# Patient Record
Sex: Male | Born: 1950 | Race: White | Hispanic: No | Marital: Married | Smoking: Former smoker
Health system: Southern US, Community
[De-identification: ages and names within clinical notes are randomized; demographics above are authoritative.]

## PROBLEM LIST (undated history)

## (undated) DIAGNOSIS — G4733 Obstructive sleep apnea (adult) (pediatric): Principal | ICD-10-CM

## (undated) DIAGNOSIS — I2510 Atherosclerotic heart disease of native coronary artery without angina pectoris: Secondary | ICD-10-CM

## (undated) DIAGNOSIS — T7840XA Allergy, unspecified, initial encounter: Secondary | ICD-10-CM

## (undated) DIAGNOSIS — I10 Essential (primary) hypertension: Secondary | ICD-10-CM

## (undated) DIAGNOSIS — I5020 Unspecified systolic (congestive) heart failure: Secondary | ICD-10-CM

## (undated) DIAGNOSIS — E669 Obesity, unspecified: Secondary | ICD-10-CM

## (undated) DIAGNOSIS — E785 Hyperlipidemia, unspecified: Secondary | ICD-10-CM

## (undated) DIAGNOSIS — I219 Acute myocardial infarction, unspecified: Secondary | ICD-10-CM

## (undated) DIAGNOSIS — I513 Intracardiac thrombosis, not elsewhere classified: Secondary | ICD-10-CM

## (undated) DIAGNOSIS — G4730 Sleep apnea, unspecified: Secondary | ICD-10-CM

## (undated) DIAGNOSIS — E119 Type 2 diabetes mellitus without complications: Secondary | ICD-10-CM

## (undated) HISTORY — DX: Unspecified systolic (congestive) heart failure: I50.20

## (undated) HISTORY — DX: Type 2 diabetes mellitus without complications: E11.9

## (undated) HISTORY — DX: Atherosclerotic heart disease of native coronary artery without angina pectoris: I25.10

## (undated) HISTORY — DX: Obesity, unspecified: E66.9

## (undated) HISTORY — DX: Acute myocardial infarction, unspecified: I21.9

## (undated) HISTORY — DX: Sleep apnea, unspecified: G47.30

## (undated) HISTORY — DX: Intracardiac thrombosis, not elsewhere classified: I51.3

## (undated) HISTORY — DX: Essential (primary) hypertension: I10

## (undated) HISTORY — DX: Hyperlipidemia, unspecified: E78.5

## (undated) HISTORY — DX: Allergy, unspecified, initial encounter: T78.40XA

## (undated) HISTORY — DX: Obstructive sleep apnea (adult) (pediatric): G47.33

---

## 1999-04-18 ENCOUNTER — Encounter: Payer: Self-pay | Admitting: Emergency Medicine

## 1999-04-18 HISTORY — PX: CORONARY STENT PLACEMENT: SHX1402

## 1999-04-19 ENCOUNTER — Inpatient Hospital Stay (HOSPITAL_COMMUNITY): Admission: EM | Admit: 1999-04-19 | Discharge: 1999-04-25 | Payer: Self-pay | Admitting: Cardiology

## 1999-04-20 DIAGNOSIS — I219 Acute myocardial infarction, unspecified: Secondary | ICD-10-CM

## 1999-04-20 HISTORY — DX: Acute myocardial infarction, unspecified: I21.9

## 1999-04-22 ENCOUNTER — Encounter: Payer: Self-pay | Admitting: *Deleted

## 1999-07-07 ENCOUNTER — Ambulatory Visit (HOSPITAL_COMMUNITY): Admission: RE | Admit: 1999-07-07 | Discharge: 1999-07-07 | Payer: Self-pay | Admitting: *Deleted

## 2010-01-05 LAB — HM COLONOSCOPY

## 2014-02-02 ENCOUNTER — Telehealth: Payer: Self-pay | Admitting: Family Medicine

## 2014-02-02 NOTE — Telephone Encounter (Signed)
Appointment given for 2/29 with Evelina Dun, FNP

## 2014-03-01 ENCOUNTER — Ambulatory Visit: Payer: Self-pay | Admitting: Family

## 2014-05-13 ENCOUNTER — Encounter: Payer: Self-pay | Admitting: Physician Assistant

## 2014-05-13 DIAGNOSIS — T7840XA Allergy, unspecified, initial encounter: Secondary | ICD-10-CM | POA: Insufficient documentation

## 2014-05-21 ENCOUNTER — Encounter: Payer: Self-pay | Admitting: Family Medicine

## 2014-05-21 ENCOUNTER — Ambulatory Visit (INDEPENDENT_AMBULATORY_CARE_PROVIDER_SITE_OTHER): Payer: Managed Care, Other (non HMO) | Admitting: Family Medicine

## 2014-05-21 VITALS — BP 100/78 | HR 60 | Temp 98.7°F | Resp 18 | Ht 70.0 in | Wt 264.0 lb

## 2014-05-21 DIAGNOSIS — R7309 Other abnormal glucose: Secondary | ICD-10-CM

## 2014-05-21 DIAGNOSIS — E785 Hyperlipidemia, unspecified: Secondary | ICD-10-CM | POA: Diagnosis not present

## 2014-05-21 DIAGNOSIS — Z0000 Encounter for general adult medical examination without abnormal findings: Secondary | ICD-10-CM | POA: Diagnosis not present

## 2014-05-21 DIAGNOSIS — I2510 Atherosclerotic heart disease of native coronary artery without angina pectoris: Secondary | ICD-10-CM | POA: Diagnosis not present

## 2014-05-21 DIAGNOSIS — Z7189 Other specified counseling: Secondary | ICD-10-CM

## 2014-05-21 DIAGNOSIS — Z7689 Persons encountering health services in other specified circumstances: Secondary | ICD-10-CM

## 2014-05-21 DIAGNOSIS — R7303 Prediabetes: Secondary | ICD-10-CM

## 2014-05-21 NOTE — Progress Notes (Signed)
Subjective:    Patient ID: Benjamin [name], male    DOB: 1950-03-24, 64 y.o.   MRN: [id number]  HPI Patient is here today to establish care. His last colonoscopy was in 2012. Per the patient he had polyps. He is recommended to have a repeat colonoscopy in 2015. This is overdue. He also is due for an annual prostate exam. He is due for the shingles vaccine. He is due for the tetanus vaccine. Past medical history is significant for myocardial infarction in 2001. He also has hyperlipidemia. Patient recently had fasting lab work performed in outside lab. CMP was significant for a fasting blood sugar of 112, a hemoglobin A1c of 6.2. PSA was normal at 1.5. CBC was normal. TSH was normal. LDL cholesterol 60 HDL cholesterol is 51 triglycerides are 134.   Past Medical History  Diagnosis Date  . Allergy   . Myocardial infarction 04/20/1999  . ASCVD (arteriosclerotic cardiovascular disease)   . Hyperlipidemia   . Hypertension    Past Surgical History  Procedure Laterality Date  . Coronary stent placement  04/18/1999   Current Outpatient Prescriptions on File Prior to Visit  Medication Sig Dispense Refill  . atorvastatin (LIPITOR) 80 MG tablet Take 80 mg by mouth daily.    . carvedilol (COREG) 3.125 MG tablet Take 3.125 mg by mouth 2 (two) times daily with a meal.    . ramipril (ALTACE) 10 MG capsule Take 10 mg by mouth daily.     No current facility-administered medications on file prior to visit.   Allergies  Allergen Reactions  . Penicillins    History   Social History  . Marital Status: Married    Spouse Name: N/A  . Number of Children: N/A  . Years of Education: N/A   Occupational History  . Not on file.   Social History Main Topics  . Smoking status: Former Smoker    Quit date: 05/13/1958  . Smokeless tobacco: Never Used  . Alcohol Use: No  . Drug Use: No  . Sexual Activity: Yes    Birth Control/ Protection: None   Other Topics Concern  . Not on file   Social History  Narrative   No family history on file.   Review of Systems  All other systems reviewed and are negative.      Objective:   Physical Exam  Constitutional: He is oriented to person, place, and time. He appears well-developed and well-nourished. No distress.  HENT:  Head: Normocephalic and atraumatic.  Right Ear: External ear normal.  Left Ear: External ear normal.  Nose: Nose normal.  Mouth/Throat: Oropharynx is clear and moist. No oropharyngeal exudate.  Eyes: Conjunctivae and EOM are normal. Pupils are equal, round, and reactive to light. Right eye exhibits no discharge. Left eye exhibits no discharge. No scleral icterus.  Neck: Normal range of motion. Neck supple. No JVD present. No tracheal deviation present. No thyromegaly present.  Cardiovascular: Normal rate, regular rhythm, normal heart sounds and intact distal pulses.  Exam reveals no gallop and no friction rub.   No murmur heard. Pulmonary/Chest: Effort normal and breath sounds normal. No stridor. No respiratory distress. He has no wheezes. He has no rales. He exhibits no tenderness.  Abdominal: Soft. Bowel sounds are normal. He exhibits no distension and no mass. There is no tenderness. There is no rebound and no guarding.  Genitourinary: Rectum normal and prostate normal.  Musculoskeletal: Normal range of motion. He exhibits no edema or tenderness.  Lymphadenopathy:  He has no cervical adenopathy.  Neurological: He is alert and oriented to person, place, and time. He has normal reflexes. He displays normal reflexes. No cranial nerve deficit. He exhibits normal muscle tone. Coordination normal.  Skin: Skin is warm. No rash noted. He is not diaphoretic. No erythema. No pallor.  Psychiatric: He has a normal mood and affect. His behavior is normal. Judgment and thought content normal.  Vitals reviewed.         Assessment & Plan:  Routine general medical examination at a health care facility  Establishing care with  new doctor, encounter for  ASCVD (arteriosclerotic cardiovascular disease)  HLD (hyperlipidemia)  Prediabetes  Patient is a prediabetic. I recommended 10-15 pounds weight loss, increasing aerobic exercise, decreasing carbohydrates in his diet. I would recheck a hemoglobin A1c and fasting blood sugar in 6 months. PSA today is normal. Prostate exam is normal. I will obtain records from his gastroenterologist. He had precancerous polyps he is due for a colonoscopy now area and I first would like to obtain old records to review. I recommended a shingles vaccine as well as a tetanus shot but the patient deferred at the present time. Cholesterol is acceptable. Blood pressures acceptable. Recheck in 6 months.

## 2014-06-28 ENCOUNTER — Other Ambulatory Visit: Payer: Self-pay | Admitting: Family Medicine

## 2014-06-28 MED ORDER — CARVEDILOL 3.125 MG PO TABS: 3.1250 mg | ORAL_TABLET | Freq: Two times a day (BID) | ORAL | Status: DC

## 2014-06-28 MED ORDER — RAMIPRIL 10 MG PO CAPS: 10.0000 mg | ORAL_CAPSULE | Freq: Every day | ORAL | Status: DC

## 2014-06-28 MED ORDER — ATORVASTATIN CALCIUM 80 MG PO TABS: 80.0000 mg | ORAL_TABLET | Freq: Every day | ORAL | Status: DC

## 2014-06-28 NOTE — Telephone Encounter (Signed)
Medication refilled per protocol. 

## 2014-09-23 ENCOUNTER — Encounter: Payer: Self-pay | Admitting: Family Medicine

## 2014-09-23 ENCOUNTER — Encounter: Payer: BC Managed Care – PPO | Admitting: Family Medicine

## 2014-09-24 NOTE — Progress Notes (Signed)
This encounter was created in error - please disregard.

## 2014-10-10 ENCOUNTER — Other Ambulatory Visit: Payer: Self-pay | Admitting: Family Medicine

## 2014-10-11 NOTE — Telephone Encounter (Signed)
Refill appropriate and filled per protocol. 

## 2014-10-26 ENCOUNTER — Encounter: Payer: Self-pay | Admitting: Family Medicine

## 2014-11-24 ENCOUNTER — Encounter: Payer: Self-pay | Admitting: Family Medicine

## 2014-11-24 ENCOUNTER — Ambulatory Visit (INDEPENDENT_AMBULATORY_CARE_PROVIDER_SITE_OTHER): Payer: BC Managed Care – PPO | Admitting: Family Medicine

## 2014-11-24 VITALS — BP 118/74 | HR 68 | Temp 98.4°F | Resp 20 | Ht 70.0 in | Wt 275.0 lb

## 2014-11-24 DIAGNOSIS — Z1211 Encounter for screening for malignant neoplasm of colon: Secondary | ICD-10-CM | POA: Diagnosis not present

## 2014-11-24 DIAGNOSIS — R7303 Prediabetes: Secondary | ICD-10-CM | POA: Diagnosis not present

## 2014-11-24 DIAGNOSIS — E785 Hyperlipidemia, unspecified: Secondary | ICD-10-CM | POA: Diagnosis not present

## 2014-11-24 DIAGNOSIS — I2510 Atherosclerotic heart disease of native coronary artery without angina pectoris: Secondary | ICD-10-CM

## 2014-11-24 DIAGNOSIS — Z23 Encounter for immunization: Secondary | ICD-10-CM | POA: Diagnosis not present

## 2014-11-24 NOTE — Addendum Note (Signed)
Addended by: Shary Decamp B on: 11/24/2014 03:11 PM   Modules accepted: Orders

## 2014-11-24 NOTE — Progress Notes (Signed)
Subjective:    Patient ID: Benjamin [name], male    DOB: 11/21/1950, 64 y.o.   MRN: [id number]  HPI 05/21/14 Patient is here today to establish care. His last colonoscopy was in 2012. Per the patient he had polyps. He is recommended to have a repeat colonoscopy in 2015. This  The patientis overdue. He also is due for an annual prostate exam. He is due for the shingles vaccine. He is due for the tetanus vaccine. Past medical history is significant for myocardial infarction in 2001. He also has hyperlipidemia. Patient recently had fasting lab work performed in outside lab. CMP was significant for a fasting blood sugar of 112, a hemoglobin A1c of 6.2. PSA was normal at 1.5. CBC was normal. TSH was normal. LDL cholesterol 60 HDL cholesterol is 51 triglycerides are 134.  At that time, my plan was: Patient is a prediabetic. I recommended 10-15 pounds weight loss, increasing aerobic exercise, decreasing carbohydrates in his diet. I would recheck a hemoglobin A1c and fasting blood sugar in 6 months. PSA today is normal. Prostate exam is normal. I will obtain records from his gastroenterologist. He had precancerous polyps he is due for a colonoscopy now area and I first would like to obtain old records to review. I recommended a shingles vaccine as well as a tetanus shot but the patient deferred at the present time. Cholesterol is acceptable. Blood pressures acceptable. Recheck in 6 months.  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 His blood pressure today is well controlled at 118/74.  He denies any chest pain, angina, doe, orthopnea, or peripheral edema.  Due for fasting labs, Pneumovax, and a colonoscopy.   Past Medical History  Diagnosis Date  . Allergy   . Myocardial infarction ([place]) 04/20/1999  . ASCVD (arteriosclerotic cardiovascular disease)   . Hyperlipidemia   . Hypertension    Past Surgical History  Procedure  Laterality Date  . Coronary stent placement  04/18/1999   Current Outpatient Prescriptions on File Prior to Visit  Medication Sig Dispense Refill  . aspirin 81 MG tablet Take 81 mg by mouth daily.    Marland Kitchen atorvastatin (LIPITOR) 80 MG tablet TAKE 1 TABLET (80 MG TOTAL) BY MOUTH DAILY AT 6 PM. 90 tablet 0  . carvedilol (COREG) 3.125 MG tablet TAKE 1 TABLET (3.125 MG TOTAL) BY MOUTH 2 (TWO) TIMES DAILY WITH A MEAL. 180 tablet 0  . Cholecalciferol (VITAMIN D) 2000 UNITS CAPS Take by mouth.    . ramipril (ALTACE) 10 MG capsule TAKE 1 CAPSULE (10 MG TOTAL) BY MOUTH DAILY. 90 capsule 0   No current facility-administered medications on file prior to visit.   Allergies  Allergen Reactions  . Penicillins    Social History   Social History  . Marital Status: Married    Spouse Name: N/A  . Number of Children: N/A  . Years of Education: N/A   Occupational History  . Not on file.   Social History Main Topics  . Smoking status: Former Smoker    Quit date: 05/13/1958  . Smokeless tobacco: Never Used  . Alcohol Use: No  . Drug Use: No  . Sexual Activity: Yes    Birth Control/ Protection: None   Other Topics Concern  . Not on file   Social History Narrative   No family history on file.   Review of Systems  All other systems reviewed and are negative.  Objective:   Physical Exam  Constitutional: He is oriented to person, place, and time. He appears well-developed and well-nourished. No distress.  HENT:  Head: Normocephalic and atraumatic.  Right Ear: External ear normal.  Left Ear: External ear normal.  Nose: Nose normal.  Mouth/Throat: Oropharynx is clear and moist. No oropharyngeal exudate.  Eyes: Conjunctivae and EOM are normal. Pupils are equal, round, and reactive to light. Right eye exhibits no discharge. Left eye exhibits no discharge. No scleral icterus.  Neck: Normal range of motion. Neck supple. No JVD present. No tracheal deviation present. No thyromegaly present.    Cardiovascular: Normal rate, regular rhythm, normal heart sounds and intact distal pulses.  Exam reveals no gallop and no friction rub.   No murmur heard. Pulmonary/Chest: Effort normal and breath sounds normal. No stridor. No respiratory distress. He has no wheezes. He has no rales. He exhibits no tenderness.  Abdominal: Soft. Bowel sounds are normal. He exhibits no distension and no mass. There is no tenderness. There is no rebound and no guarding.  Genitourinary: Rectum normal and prostate normal.  Musculoskeletal: Normal range of motion. He exhibits no edema or tenderness.  Lymphadenopathy:    He has no cervical adenopathy.  Neurological: He is alert and oriented to person, place, and time. He has normal reflexes. No cranial nerve deficit. He exhibits normal muscle tone. Coordination normal.  Skin: Skin is warm. No rash noted. He is not diaphoretic. No erythema. No pallor.  Psychiatric: He has a normal mood and affect. His behavior is normal. Judgment and thought content normal.  Vitals reviewed.         Assessment & Plan:  ASCVD (arteriosclerotic cardiovascular disease) - Plan: COMPLETE METABOLIC PANEL WITH GFR, Lipid panel  HLD (hyperlipidemia)  Prediabetes - Plan: COMPLETE METABOLIC PANEL WITH GFR, Lipid panel, Hemoglobin A1c  Colon cancer screening - Plan: Ambulatory referral to Gastroenterology  His blood pressure is excellent.  He has no symptoms of unstable angina.  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  We will fax this to the [facility].  I will schedule him for a colonoscopy.  I will also administer pneumovax 23 today.  I asked him to return fasting for CMP/FLP/HgA1c next week.

## 2014-11-30 ENCOUNTER — Other Ambulatory Visit: Payer: BC Managed Care – PPO

## 2014-11-30 LAB — COMPLETE METABOLIC PANEL WITH GFR
ALT: 19 U/L (ref 9–46)
AST: 18 U/L (ref 10–35)
Albumin: 3.9 g/dL (ref 3.6–5.1)
Alkaline Phosphatase: 108 U/L (ref 40–115)
BUN: 17 mg/dL (ref 7–25)
CHLORIDE: 102 mmol/L (ref 98–110)
CO2: 27 mmol/L (ref 20–31)
Calcium: 9.1 mg/dL (ref 8.6–10.3)
Creat: 1.19 mg/dL (ref 0.70–1.25)
GFR, EST AFRICAN AMERICAN: 74 mL/min (ref >=60)
GFR, Est Non African American: 64 mL/min (ref >=60)
GLUCOSE: 102 mg/dL — AB (ref 70–99)
Potassium: 5.1 mmol/L (ref 3.5–5.3)
SODIUM: 138 mmol/L (ref 135–146)
TOTAL PROTEIN: 6.3 g/dL (ref 6.1–8.1)
Total Bilirubin: 0.9 mg/dL (ref 0.2–1.2)

## 2014-11-30 LAB — LIPID PANEL
Cholesterol: 133 mg/dL (ref 125–200)
HDL: 43 mg/dL (ref >=40)
LDL CALC: 67 mg/dL (ref <130)
TRIGLYCERIDES: 113 mg/dL (ref <150)
Total CHOL/HDL Ratio: 3.1 Ratio (ref <=5.0)
VLDL: 23 mg/dL (ref <30)

## 2014-11-30 LAB — HEMOGLOBIN A1C
HEMOGLOBIN A1C: 6.5 % — AB (ref <5.7)
Mean Plasma Glucose: 140 mg/dL — ABNORMAL HIGH (ref <117)

## 2015-02-01 ENCOUNTER — Encounter: Payer: Self-pay | Admitting: Family Medicine

## 2015-02-01 ENCOUNTER — Ambulatory Visit (INDEPENDENT_AMBULATORY_CARE_PROVIDER_SITE_OTHER): Payer: BC Managed Care – PPO | Admitting: Family Medicine

## 2015-02-01 VITALS — BP 110/66 | HR 76 | Temp 98.6°F | Resp 20 | Wt 269.0 lb

## 2015-02-01 DIAGNOSIS — J324 Chronic pansinusitis: Secondary | ICD-10-CM

## 2015-02-01 MED ORDER — CEFDINIR 300 MG PO CAPS: 300.0000 mg | ORAL_CAPSULE | Freq: Two times a day (BID) | ORAL | Status: DC

## 2015-02-01 NOTE — Progress Notes (Signed)
   Subjective:    Patient ID: Benjamin [name], male    DOB: 14-Jul-1950, 65 y.o.   MRN: [id number]  HPI   patient has been dealing with an upper respiratory infection for 2 months. Symptoms consist of sinus pressure, sinus pain, constant postnasal drip, constant pain and pressure in his frontal and maxillary sinuses and subjective fever. He is waiting for more than 6 weeks with no improvement in his symptoms Past Medical History  Diagnosis Date  . Allergy   . Myocardial infarction ([place]) 04/20/1999  . ASCVD (arteriosclerotic cardiovascular disease)   . Hyperlipidemia   . Hypertension    Past Surgical History  Procedure Laterality Date  . Coronary stent placement  04/18/1999   Current Outpatient Prescriptions on File Prior to Visit  Medication Sig Dispense Refill  . aspirin 81 MG tablet Take 81 mg by mouth daily.    Marland Kitchen atorvastatin (LIPITOR) 80 MG tablet TAKE 1 TABLET (80 MG TOTAL) BY MOUTH DAILY AT 6 PM. 90 tablet 0  . carvedilol (COREG) 3.125 MG tablet TAKE 1 TABLET (3.125 MG TOTAL) BY MOUTH 2 (TWO) TIMES DAILY WITH A MEAL. 180 tablet 0  . Cholecalciferol (VITAMIN D) 2000 UNITS CAPS Take by mouth.    . ramipril (ALTACE) 10 MG capsule TAKE 1 CAPSULE (10 MG TOTAL) BY MOUTH DAILY. 90 capsule 0   No current facility-administered medications on file prior to visit.   Allergies  Allergen Reactions  . Penicillins    Social History   Social History  . Marital Status: Married    Spouse Name: N/A  . Number of Children: N/A  . Years of Education: N/A   Occupational History  . Not on file.   Social History Main Topics  . Smoking status: Former Smoker    Quit date: 05/13/1958  . Smokeless tobacco: Never Used  . Alcohol Use: No  . Drug Use: No  . Sexual Activity: Yes    Birth Control/ Protection: None   Other Topics Concern  . Not on file   Social History Narrative     Review of Systems  All other systems reviewed and are negative.      Objective:   Physical Exam    Constitutional: He appears well-developed and well-nourished.  HENT:  Right Ear: Tympanic membrane and ear canal normal.  Left Ear: Tympanic membrane and ear canal normal.  Nose: Mucosal edema and rhinorrhea present. Right sinus exhibits maxillary sinus tenderness and frontal sinus tenderness. Left sinus exhibits maxillary sinus tenderness and frontal sinus tenderness.  Mouth/Throat: Oropharynx is clear and moist. No oropharyngeal exudate.  Eyes: Conjunctivae are normal. Pupils are equal, round, and reactive to light.  Neck: Neck supple.  Cardiovascular: Normal rate, regular rhythm and normal heart sounds.   No murmur heard. Pulmonary/Chest: Effort normal and breath sounds normal. No respiratory distress. He has no wheezes. He has no rales.  Lymphadenopathy:    He has no cervical adenopathy.  Vitals reviewed.         Assessment & Plan:  Chronic pansinusitis - Plan: cefdinir (OMNICEF) 300 MG capsule   Patient has a chronic sinus infection. Begin Omnicef 300 mg by mouth twice a day for 10 days and use nasal saline. The documented allergy to penicillin sounds more like a viral rash he had when he was a toddler. Recheck in 2 weeks if no better or immediately if worse

## 2015-02-11 ENCOUNTER — Ambulatory Visit (INDEPENDENT_AMBULATORY_CARE_PROVIDER_SITE_OTHER): Payer: BC Managed Care – PPO | Admitting: Family Medicine

## 2015-02-11 ENCOUNTER — Encounter: Payer: Self-pay | Admitting: Family Medicine

## 2015-02-11 VITALS — BP 110/80 | HR 74 | Temp 98.4°F | Resp 18 | Ht 70.0 in | Wt 269.0 lb

## 2015-02-11 DIAGNOSIS — R053 Chronic cough: Secondary | ICD-10-CM

## 2015-02-11 DIAGNOSIS — R05 Cough: Secondary | ICD-10-CM

## 2015-02-11 MED ORDER — LOSARTAN POTASSIUM 100 MG PO TABS: 100.0000 mg | ORAL_TABLET | Freq: Every day | ORAL | Status: DC

## 2015-02-11 MED ORDER — BENZONATATE 100 MG PO CAPS: 200.0000 mg | ORAL_CAPSULE | Freq: Three times a day (TID) | ORAL | Status: DC | PRN

## 2015-02-11 NOTE — Progress Notes (Signed)
Subjective:    Patient ID: Benjamin [name], male    DOB: [birth year], 65 y.o.   MRN: [id number]  HPI  02/01/15 Patient has been dealing with an upper respiratory infection for 2 months. Symptoms consist of sinus pressure, sinus pain, constant postnasal drip, constant pain and pressure in his frontal and maxillary sinuses and subjective fever. He is waiting for more than 6 weeks with no improvement in his symptoms.  At that time, my plan was: Patient has a chronic sinus infection. Begin Omnicef 300 mg by mouth twice a day for 10 days and use nasal saline. The documented allergy to penicillin sounds more like a viral rash he had when he was a toddler. Recheck in 2 weeks if no better or immediately if worse.  02/11/15 Sinusitis has completely resolved. He denies any sinus headache. He denies any sinus pain. He denies any postnasal drip. He denies any fevers.  However he continues to have a tickle cough that will not subside. It is nonproductive. He denies any fevers or chills or hemoptysis. The cough has been going on for quite some time   Past Medical History  Diagnosis Date  . Allergy   . Myocardial infarction ([place]) 04/20/1999  . ASCVD (arteriosclerotic cardiovascular disease)   . Hyperlipidemia   . Hypertension    Past Surgical History  Procedure Laterality Date  . Coronary stent placement  04/18/1999   Current Outpatient Prescriptions on File Prior to Visit  Medication Sig Dispense Refill  . aspirin 81 MG tablet Take 81 mg by mouth daily.    Marland Kitchen atorvastatin (LIPITOR) 80 MG tablet TAKE 1 TABLET (80 MG TOTAL) BY MOUTH DAILY AT 6 PM. 90 tablet 0  . carvedilol (COREG) 3.125 MG tablet TAKE 1 TABLET (3.125 MG TOTAL) BY MOUTH 2 (TWO) TIMES DAILY WITH A MEAL. 180 tablet 0  . cefdinir (OMNICEF) 300 MG capsule Take 1 capsule (300 mg total) by mouth 2 (two) times daily. 20 capsule 0  . Cholecalciferol (VITAMIN D) 2000 UNITS CAPS Take by mouth.    . ramipril (ALTACE) 10 MG capsule TAKE 1 CAPSULE (10  MG TOTAL) BY MOUTH DAILY. 90 capsule 0   No current facility-administered medications on file prior to visit.   Allergies  Allergen Reactions  . Penicillins    Social History   Social History  . Marital Status: Married    Spouse Name: N/A  . Number of Children: N/A  . Years of Education: N/A   Occupational History  . Not on file.   Social History Main Topics  . Smoking status: Former Smoker    Quit date: 05/13/1958  . Smokeless tobacco: Never Used  . Alcohol Use: No  . Drug Use: No  . Sexual Activity: Yes    Birth Control/ Protection: None   Other Topics Concern  . Not on file   Social History Narrative     Review of Systems  All other systems reviewed and are negative.      Objective:   Physical Exam  Constitutional: He appears well-developed and well-nourished.  HENT:  Right Ear: Tympanic membrane and ear canal normal.  Left Ear: Tympanic membrane and ear canal normal.  Nose: No mucosal edema or rhinorrhea. Right sinus exhibits no maxillary sinus tenderness and no frontal sinus tenderness. Left sinus exhibits no maxillary sinus tenderness and no frontal sinus tenderness.  Mouth/Throat: Oropharynx is clear and moist. No oropharyngeal exudate.  Eyes: Conjunctivae are normal. Pupils are equal, round, and reactive to light.  Neck: Neck supple.  Cardiovascular: Normal rate, regular rhythm and normal heart sounds.   No murmur heard. Pulmonary/Chest: Effort normal and breath sounds normal. No respiratory distress. He has no wheezes. He has no rales.  Lymphadenopathy:    He has no cervical adenopathy.  Vitals reviewed.         Assessment & Plan:  Chronic cough - Plan: losartan (COZAAR) 100 MG tablet, benzonatate (TESSALON) 100 MG capsule  All the cough could be due to sinusitis, I would like the patient to discontinue gram of peripheral and replaced with losartan 100 mg by mouth daily. Use Tessalon Perles 200 mg every 8 hours as needed for cough. If cough is  not better in 2 weeks, proceed with a chest x-ray

## 2015-04-13 ENCOUNTER — Other Ambulatory Visit: Payer: Self-pay | Admitting: Family Medicine

## 2015-04-13 MED ORDER — ATORVASTATIN CALCIUM 80 MG PO TABS: ORAL_TABLET | ORAL | Status: DC

## 2015-04-13 MED ORDER — CARVEDILOL 3.125 MG PO TABS: ORAL_TABLET | ORAL | Status: DC

## 2015-04-13 MED ORDER — RAMIPRIL 10 MG PO CAPS: ORAL_CAPSULE | ORAL | Status: DC

## 2015-04-14 ENCOUNTER — Other Ambulatory Visit: Payer: Self-pay | Admitting: Family Medicine

## 2015-04-14 DIAGNOSIS — Z8679 Personal history of other diseases of the circulatory system: Secondary | ICD-10-CM

## 2015-05-05 ENCOUNTER — Encounter: Payer: Self-pay | Admitting: Cardiology

## 2015-05-05 ENCOUNTER — Telehealth: Payer: Self-pay | Admitting: Cardiology

## 2015-05-05 ENCOUNTER — Ambulatory Visit (INDEPENDENT_AMBULATORY_CARE_PROVIDER_SITE_OTHER): Payer: BC Managed Care – PPO | Admitting: Cardiology

## 2015-05-05 VITALS — BP 115/73 | HR 68 | Ht 70.0 in | Wt 267.0 lb

## 2015-05-05 DIAGNOSIS — I2510 Atherosclerotic heart disease of native coronary artery without angina pectoris: Secondary | ICD-10-CM | POA: Diagnosis not present

## 2015-05-05 DIAGNOSIS — I10 Essential (primary) hypertension: Secondary | ICD-10-CM

## 2015-05-05 DIAGNOSIS — Z136 Encounter for screening for cardiovascular disorders: Secondary | ICD-10-CM

## 2015-05-05 DIAGNOSIS — E785 Hyperlipidemia, unspecified: Secondary | ICD-10-CM

## 2015-05-05 NOTE — Progress Notes (Signed)
Patient ID: Benjamin [name], male   DOB: [birth year], 65 y.o.   MRN: [id number]     Clinical Summary [name] is a 65 y.o.male seen today as a new patient he is referred by Dr Benjamin [name] for the following medical problems.  1. CAD - previous notes mention MI in 2001 at [facility], cannot find cath report. Patient has stent card that shows he received a stent to his LAD at that time.  - no repeat caths, no recent stress test - no chest pain. No SOB or DOE, does heavy yardwork without issues - compliant with meds - denies any LE edema, no orthopnea.   2. HTN - compliant with meds  3. Hyperlipidemia - 11/2014 TC 133 TG 113 HDL 43 LDL 67 - compliant with statin   SH: applying to drive school bus.  Past Medical History  Diagnosis Date  . Allergy   . Myocardial infarction ([place]) 04/20/1999  . ASCVD (arteriosclerotic cardiovascular disease)   . Hyperlipidemia   . Hypertension      Allergies  Allergen Reactions  . Penicillins      Current Outpatient Prescriptions  Medication Sig Dispense Refill  . aspirin 81 MG tablet Take 81 mg by mouth daily.    Marland Kitchen atorvastatin (LIPITOR) 80 MG tablet TAKE 1 TABLET (80 MG TOTAL) BY MOUTH DAILY AT 6 PM. 90 tablet 0  . benzonatate (TESSALON) 100 MG capsule Take 2 capsules (200 mg total) by mouth 3 (three) times daily as needed for cough. 30 capsule 0  . carvedilol (COREG) 3.125 MG tablet TAKE 1 TABLET (3.125 MG TOTAL) BY MOUTH 2 (TWO) TIMES DAILY WITH A MEAL. 180 tablet 0  . Cholecalciferol (VITAMIN D) 2000 UNITS CAPS Take by mouth.    . losartan (COZAAR) 100 MG tablet Take 1 tablet (100 mg total) by mouth daily. 90 tablet 3  . ramipril (ALTACE) 10 MG capsule TAKE 1 CAPSULE (10 MG TOTAL) BY MOUTH DAILY. 90 capsule 0   No current facility-administered medications for this visit.     Past Surgical History  Procedure Laterality Date  . Coronary stent placement  04/18/1999     Allergies  Allergen Reactions  . Penicillins        Family History No family history of heart disease   Social History [name] reports that he quit smoking about 57 years ago. He has never used smokeless tobacco. [name] reports that he does not drink alcohol.   Review of Systems CONSTITUTIONAL: No weight loss, fever, chills, weakness or fatigue.  HEENT: Eyes: No visual loss, blurred vision, double vision or yellow sclerae.No hearing loss, sneezing, congestion, runny nose or sore throat.  SKIN: No rash or itching.  CARDIOVASCULAR: per HPI RESPIRATORY: No shortness of breath, cough or sputum.  GASTROINTESTINAL: No anorexia, nausea, vomiting or diarrhea. No abdominal pain or blood.  GENITOURINARY: No burning on urination, no polyuria NEUROLOGICAL: No headache, dizziness, syncope, paralysis, ataxia, numbness or tingling in the extremities. No change in bowel or bladder control.  MUSCULOSKELETAL: No muscle, back pain, joint pain or stiffness.  LYMPHATICS: No enlarged nodes. No history of splenectomy.  PSYCHIATRIC: No history of depression or anxiety.  ENDOCRINOLOGIC: No reports of sweating, cold or heat intolerance. No polyuria or polydipsia.  Marland Kitchen   Physical Examination Filed Vitals:   05/05/15 1336  BP: 115/73  Pulse: 68   Filed Vitals:   05/05/15 1336  Height: 5\' 10"  (1.778 m)  Weight: 267 lb (121.11 kg)    Gen: resting  comfortably, no acute distress HEENT: no scleral icterus, pupils equal round and reactive, no palptable cervical adenopathy,  CV: RRR, no m/r/g, no jvd Resp: Clear to auscultation bilaterally GI: abdomen is soft, non-tender, non-distended, normal bowel sounds, no hepatosplenomegaly MSK: extremities are warm, no edema.  Skin: warm, no rash Neuro:  no focal deficits Psych: appropriate affect     Assessment and Plan  1. CAD - no recent symptoms. 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   2. HTN - at goal, continue current meds  3. Hyperlipidemia - at goal, continue current statin  F/u 6 months  Benjamin [name], M.D.

## 2015-05-05 NOTE — Patient Instructions (Signed)
Your physician wants you to follow-up in: 6 MONTHS WITH DR. BRANCH You will receive a reminder letter in the mail two months in advance. If you don't receive a letter, please call our office to schedule the follow-up appointment.  Your physician recommends that you continue on your current medications as directed. Please refer to the Current Medication list given to you today.  Your physician has requested that you have an echocardiogram. Echocardiography is a painless test that uses sound waves to create images of your heart. It provides your doctor with information about the size and shape of your heart and how well your heart's chambers and valves are working. This procedure takes approximately one hour. There are no restrictions for this procedure.  Thank you for choosing [facility]!!   

## 2015-05-05 NOTE — Telephone Encounter (Signed)
Echo complete set for 5/9 at [place]

## 2015-05-06 NOTE — Telephone Encounter (Signed)
PRECERT OBTAINED AUTH# [id number] EXPIRES 06/04/2015 HAVE A GREAT WEEKEND

## 2015-05-10 ENCOUNTER — Telehealth: Payer: Self-pay | Admitting: *Deleted

## 2015-05-10 ENCOUNTER — Ambulatory Visit (HOSPITAL_COMMUNITY)
Admission: RE | Admit: 2015-05-10 | Discharge: 2015-05-10 | Disposition: A | Discharge status: Home / Self Care | Payer: BC Managed Care – PPO | Source: Ambulatory Visit | Attending: Cardiology | Admitting: Cardiology

## 2015-05-10 DIAGNOSIS — R29898 Other symptoms and signs involving the musculoskeletal system: Secondary | ICD-10-CM | POA: Diagnosis not present

## 2015-05-10 DIAGNOSIS — I358 Other nonrheumatic aortic valve disorders: Secondary | ICD-10-CM | POA: Diagnosis not present

## 2015-05-10 DIAGNOSIS — I2510 Atherosclerotic heart disease of native coronary artery without angina pectoris: Secondary | ICD-10-CM | POA: Insufficient documentation

## 2015-05-10 MED ORDER — PERFLUTREN LIPID MICROSPHERE
1.0000 mL | INTRAVENOUS | Status: AC | PRN
  Administered 2015-05-10 (×2): 1 mL via INTRAVENOUS
  Administered 2015-05-10: 2 mL via INTRAVENOUS

## 2015-05-10 NOTE — Telephone Encounter (Signed)
-----   Message from Arnoldo Lenis, MD sent at 05/10/2015 12:25 PM EDT ----- Echo shows that his heart muscle if quite weak. We need to discuss potential changes in his medications and further testing. [place] to discuss findings in detail, med changes, and further testing   Zandra Abts MD

## 2015-05-10 NOTE — Telephone Encounter (Signed)
Pt aware and agreeable to 5/18 appt. Routed to pcp

## 2015-05-11 ENCOUNTER — Telehealth: Payer: Self-pay | Admitting: *Deleted

## 2015-05-11 NOTE — Telephone Encounter (Signed)
Pt was scheduled for 5/18 appt but will be out of town. Pt rescheduled in [place] office for 05/16/15 @1 :20pm.

## 2015-05-16 ENCOUNTER — Ambulatory Visit (INDEPENDENT_AMBULATORY_CARE_PROVIDER_SITE_OTHER): Payer: BC Managed Care – PPO | Admitting: Cardiology

## 2015-05-16 ENCOUNTER — Encounter: Payer: Self-pay | Admitting: Cardiology

## 2015-05-16 ENCOUNTER — Other Ambulatory Visit (HOSPITAL_COMMUNITY)
Admission: RE | Admit: 2015-05-16 | Discharge: 2015-05-16 | Disposition: A | Discharge status: Home / Self Care | Payer: BC Managed Care – PPO | Source: Ambulatory Visit | Attending: Cardiology | Admitting: Cardiology

## 2015-05-16 ENCOUNTER — Ambulatory Visit (HOSPITAL_COMMUNITY)
Admission: RE | Admit: 2015-05-16 | Discharge: 2015-05-16 | Disposition: A | Discharge status: Home / Self Care | Payer: BC Managed Care – PPO | Source: Ambulatory Visit | Attending: Cardiology | Admitting: Cardiology

## 2015-05-16 ENCOUNTER — Encounter: Payer: Self-pay | Admitting: *Deleted

## 2015-05-16 VITALS — BP 124/80 | HR 59 | Ht 70.0 in | Wt 262.0 lb

## 2015-05-16 DIAGNOSIS — I5022 Chronic systolic (congestive) heart failure: Secondary | ICD-10-CM

## 2015-05-16 DIAGNOSIS — I2510 Atherosclerotic heart disease of native coronary artery without angina pectoris: Secondary | ICD-10-CM

## 2015-05-16 DIAGNOSIS — Z01812 Encounter for preprocedural laboratory examination: Secondary | ICD-10-CM | POA: Insufficient documentation

## 2015-05-16 DIAGNOSIS — Z01818 Encounter for other preprocedural examination: Secondary | ICD-10-CM

## 2015-05-16 DIAGNOSIS — Z01810 Encounter for preprocedural cardiovascular examination: Secondary | ICD-10-CM | POA: Insufficient documentation

## 2015-05-16 LAB — CBC WITH DIFFERENTIAL/PLATELET
Basophils Absolute: 0 10*3/uL (ref 0.0–0.1)
Basophils Relative: 0 %
EOS ABS: 0.2 10*3/uL (ref 0.0–0.7)
EOS PCT: 3 %
HCT: 46.8 % (ref 39.0–52.0)
HEMOGLOBIN: 15.1 g/dL (ref 13.0–17.0)
LYMPHS ABS: 3 10*3/uL (ref 0.7–4.0)
Lymphocytes Relative: 33 %
MCH: 27.7 pg (ref 26.0–34.0)
MCHC: 32.3 g/dL (ref 30.0–36.0)
MCV: 85.9 fL (ref 78.0–100.0)
MONOS PCT: 8 %
Monocytes Absolute: 0.8 10*3/uL (ref 0.1–1.0)
NEUTROS PCT: 56 %
Neutro Abs: 5 10*3/uL (ref 1.7–7.7)
Platelets: 249 10*3/uL (ref 150–400)
RBC: 5.45 MIL/uL (ref 4.22–5.81)
RDW: 14.1 % (ref 11.5–15.5)
WBC: 9 10*3/uL (ref 4.0–10.5)

## 2015-05-16 LAB — BASIC METABOLIC PANEL
Anion gap: 7 (ref 5–15)
BUN: 12 mg/dL (ref 6–20)
CALCIUM: 9.1 mg/dL (ref 8.9–10.3)
CHLORIDE: 103 mmol/L (ref 101–111)
CO2: 28 mmol/L (ref 22–32)
Creatinine, Ser: 1 mg/dL (ref 0.61–1.24)
GFR calc Af Amer: >60 mL/min (ref >60)
Glucose, Bld: 107 mg/dL — ABNORMAL HIGH (ref 65–99)
Potassium: 4.5 mmol/L (ref 3.5–5.1)
SODIUM: 138 mmol/L (ref 135–145)

## 2015-05-16 LAB — PROTIME-INR
INR: 0.95 (ref 0.00–1.49)
Prothrombin Time: 12.9 seconds (ref 11.6–15.2)

## 2015-05-16 LAB — TSH: TSH: 1.715 u[IU]/mL (ref 0.350–4.500)

## 2015-05-16 MED ORDER — SACUBITRIL-VALSARTAN 49-51 MG PO TABS: 1.0000 | ORAL_TABLET | Freq: Two times a day (BID) | ORAL | Status: DC

## 2015-05-16 MED ORDER — CARVEDILOL 6.25 MG PO TABS: 6.2500 mg | ORAL_TABLET | Freq: Two times a day (BID) | ORAL | Status: DC

## 2015-05-16 NOTE — Patient Instructions (Signed)
Your physician recommends that you schedule a follow-up appointment in: 3 Weeks with Dr. Harl Bowie   Your physician has requested that you have a cardiac catheterization. Cardiac catheterization is used to diagnose and/or treat various heart conditions. Doctors may recommend this procedure for a number of different reasons. The most common reason is to evaluate chest pain. Chest pain can be a symptom of coronary artery disease (CAD), and cardiac catheterization can show whether plaque is narrowing or blocking your heart's arteries. This procedure is also used to evaluate the valves, as well as measure the blood flow and oxygen levels in different parts of your heart. For further information please visit [email or web address]. Please follow instruction sheet, as given.  Your physician has recommended you make the following change in your medication:   Increase Coreg to 6.25 Two Times Daily  Start Entresto 49mg  / 51 mg  Stop Taking Losartan  Your physician recommends that you return for lab work in: Fasting   Please Have Chest X-Ray Done Today  If you need a refill on your cardiac medications before your next appointment, please call your pharmacy.  Thank you for choosing [facility]!

## 2015-05-16 NOTE — Progress Notes (Addendum)
Patient ID: Benjamin [name], male   DOB: August 18, 1950, 65 y.o.   MRN: [id number]     Clinical Summary Benjamin [name] is a 65 y.o.male seen today as a new patient he is referred by Dr Jenna Luo for the following medical problems.  1. CAD - previous notes mention MI in 2001 at [facility], cannot find cath report. Patient has stent card that shows he received a stent to his LAD at that time.  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 - denies any SOB, LE edema, or DOE.      SH: applying to drive school bus.  Past Medical History  Diagnosis Date  . Allergy   . Myocardial infarction ([place]) 04/20/1999  . ASCVD (arteriosclerotic cardiovascular disease)   . Hyperlipidemia   . Hypertension      Allergies  Allergen Reactions  . Penicillins      Current Outpatient Prescriptions  Medication Sig Dispense Refill  . aspirin 81 MG tablet Take 81 mg by mouth daily. Reported on 05/05/2015    . atorvastatin (LIPITOR) 80 MG tablet TAKE 1 TABLET (80 MG TOTAL) BY MOUTH DAILY AT 6 PM. 90 tablet 0  . benzonatate (TESSALON) 100 MG capsule Take 2 capsules (200 mg total) by mouth 3 (three) times daily as needed for cough. 30 capsule 0  . carvedilol (COREG) 3.125 MG tablet TAKE 1 TABLET (3.125 MG TOTAL) BY MOUTH 2 (TWO) TIMES DAILY WITH A MEAL. 180 tablet 0  . Cholecalciferol (VITAMIN D) 2000 UNITS CAPS Take 1 capsule by mouth daily.     Marland Kitchen losartan (COZAAR) 100 MG tablet Take 1 tablet (100 mg total) by mouth daily. 90 tablet 3   No current facility-administered medications for this visit.     Past Surgical History  Procedure Laterality Date  . Coronary stent placement  04/18/1999     Allergies  Allergen Reactions  . Penicillins      Family History He denies any family history of heart disease   Social  History Benjamin [name] reports that he quit smoking about 57 years ago. He has never used smokeless tobacco. Benjamin [name] reports that he does not drink alcohol.   Review of Systems CONSTITUTIONAL: No weight loss, fever, chills, weakness or fatigue.  HEENT: Eyes: No visual loss, blurred vision, double vision or yellow sclerae.No hearing loss, sneezing, congestion, runny nose or sore throat.  SKIN: No rash or itching.  CARDIOVASCULAR: per HPI RESPIRATORY: No shortness of breath, cough or sputum.  GASTROINTESTINAL: No anorexia, nausea, vomiting or diarrhea. No abdominal pain or blood.  GENITOURINARY: No burning on urination, no polyuria NEUROLOGICAL: No headache, dizziness, syncope, paralysis, ataxia, numbness or tingling in the extremities. No change in bowel or bladder control.  MUSCULOSKELETAL: No muscle, back pain, joint pain or stiffness.  LYMPHATICS: No enlarged nodes. No history of splenectomy.  PSYCHIATRIC: No history of depression or anxiety.  ENDOCRINOLOGIC: No reports of sweating, cold or heat intolerance. No polyuria or polydipsia.  Marland Kitchen   Physical Examination Filed Vitals:   05/16/15 1251  BP: 124/80  Pulse: 59   Filed Vitals:   05/16/15 1251  Height: 5\' 10"  (1.778 m)  Weight: 262 lb (118.842 kg)    Gen: resting comfortably, no acute distress HEENT: no scleral icterus, pupils equal round and  reactive, no palptable cervical adenopathy,  CV: RRR, no m/r/g, no jvd Resp: Clear to auscultation bilaterally GI: abdomen is soft, non-tender, non-distended, normal bowel sounds, no hepatosplenomegaly MSK: extremities are warm, no edema.  Skin: warm, no rash Neuro:  no focal deficits Psych: appropriate affect   Diagnostic Studies Study Conclusions  - Left ventricle: The cavity size was normal. Wall thickness was  normal. Systolic function was severely reduced. The estimated  ejection fraction was in the range of 25% to 30%. Diffuse  hypokinesis. Doppler parameters are  consistent with abnormal left  ventricular relaxation (grade 1 diastolic dysfunction). - Regional wall motion abnormality: Akinesis of the mid anterior,  basal-mid anteroseptal, and apical myocardium. - Aortic valve: Mildly calcified annulus. Trileaflet; mildly  thickened leaflets. Valve area (VTI): 2.42 cm^2. Valve area  (Vmax): 2.32 cm^2. - Atrial septum: No defect or patent foramen ovale was identified. - Technically difficult study. Echocontrast was used to enhance  visualization.    Assessment and Plan  1. CAD/Chronic systolic heart failure - history of stent to LAD in 2001, since that time no cardiac testing - recent echo shows new diagnosis of sysotlic dysfunciton, LVEF 25-30% - suspect probable ICM, he will be referred for LHC/RHC - we will increase coreg to 6.25mg  bid. We will stop losartan and start entresto.  - add TSH to precath labs   F/u 3 weeks.     Arnoldo Lenis, M.D.   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 We will fax papers over to the [facility] per his request.   Carlyle Dolly MD

## 2015-05-17 ENCOUNTER — Other Ambulatory Visit: Payer: Self-pay | Admitting: Cardiology

## 2015-05-17 DIAGNOSIS — I5022 Chronic systolic (congestive) heart failure: Secondary | ICD-10-CM

## 2015-05-19 ENCOUNTER — Encounter (HOSPITAL_COMMUNITY)
Admission: RE | Disposition: A | Discharge status: Home / Self Care | Payer: Self-pay | Source: Ambulatory Visit | Attending: Interventional Cardiology

## 2015-05-19 ENCOUNTER — Ambulatory Visit (HOSPITAL_COMMUNITY)
Admission: RE | Admit: 2015-05-19 | Discharge: 2015-05-19 | Disposition: A | Discharge status: Home / Self Care | Payer: BC Managed Care – PPO | Source: Ambulatory Visit | Attending: Interventional Cardiology | Admitting: Interventional Cardiology

## 2015-05-19 ENCOUNTER — Ambulatory Visit: Payer: BC Managed Care – PPO | Admitting: Cardiology

## 2015-05-19 ENCOUNTER — Encounter (HOSPITAL_COMMUNITY): Payer: Self-pay | Admitting: Interventional Cardiology

## 2015-05-19 DIAGNOSIS — I2510 Atherosclerotic heart disease of native coronary artery without angina pectoris: Secondary | ICD-10-CM

## 2015-05-19 DIAGNOSIS — I110 Hypertensive heart disease with heart failure: Secondary | ICD-10-CM | POA: Diagnosis not present

## 2015-05-19 DIAGNOSIS — Z7982 Long term (current) use of aspirin: Secondary | ICD-10-CM | POA: Insufficient documentation

## 2015-05-19 DIAGNOSIS — E785 Hyperlipidemia, unspecified: Secondary | ICD-10-CM | POA: Diagnosis not present

## 2015-05-19 DIAGNOSIS — I252 Old myocardial infarction: Secondary | ICD-10-CM | POA: Insufficient documentation

## 2015-05-19 DIAGNOSIS — Z880 Allergy status to penicillin: Secondary | ICD-10-CM | POA: Diagnosis not present

## 2015-05-19 DIAGNOSIS — I5022 Chronic systolic (congestive) heart failure: Secondary | ICD-10-CM | POA: Diagnosis not present

## 2015-05-19 DIAGNOSIS — Z955 Presence of coronary angioplasty implant and graft: Secondary | ICD-10-CM | POA: Insufficient documentation

## 2015-05-19 DIAGNOSIS — Z87891 Personal history of nicotine dependence: Secondary | ICD-10-CM | POA: Insufficient documentation

## 2015-05-19 DIAGNOSIS — I219 Acute myocardial infarction, unspecified: Secondary | ICD-10-CM | POA: Diagnosis present

## 2015-05-19 HISTORY — PX: CARDIAC CATHETERIZATION: SHX172

## 2015-05-19 LAB — POCT I-STAT 3, ART BLOOD GAS (G3+)
Acid-base deficit: 2 mmol/L (ref 0.0–2.0)
Bicarbonate: 24 mEq/L (ref 20.0–24.0)
O2 SAT: 94 %
PCO2 ART: 43.2 mmHg (ref 35.0–45.0)
PH ART: 7.352 (ref 7.350–7.450)
PO2 ART: 76 mmHg — AB (ref 80.0–100.0)
TCO2: 25 mmol/L (ref 0–100)

## 2015-05-19 LAB — POCT I-STAT 3, VENOUS BLOOD GAS (G3P V)
ACID-BASE DEFICIT: 1 mmol/L (ref 0.0–2.0)
BICARBONATE: 25 meq/L — AB (ref 20.0–24.0)
O2 Saturation: 66 %
PCO2 VEN: 45.4 mmHg (ref 45.0–50.0)
PH VEN: 7.349 — AB (ref 7.250–7.300)
PO2 VEN: 36 mmHg (ref 31.0–45.0)
TCO2: 26 mmol/L (ref 0–100)

## 2015-05-19 SURGERY — RIGHT/LEFT HEART CATH AND CORONARY ANGIOGRAPHY

## 2015-05-19 MED ORDER — VERAPAMIL HCL 2.5 MG/ML IV SOLN
INTRAVENOUS | Status: DC | PRN
  Administered 2015-05-19: 10 mL via INTRA_ARTERIAL

## 2015-05-19 MED ORDER — SODIUM CHLORIDE 0.9 % IV SOLN: 250.0000 mL | INTRAVENOUS | Status: DC | PRN

## 2015-05-19 MED ORDER — OXYCODONE-ACETAMINOPHEN 5-325 MG PO TABS: 1.0000 | ORAL_TABLET | ORAL | Status: DC | PRN

## 2015-05-19 MED ORDER — HEPARIN (PORCINE) IN NACL 2-0.9 UNIT/ML-% IJ SOLN
INTRAMUSCULAR | Status: DC | PRN
  Administered 2015-05-19: 1500 mL

## 2015-05-19 MED ORDER — FENTANYL CITRATE (PF) 100 MCG/2ML IJ SOLN
INTRAMUSCULAR | Status: AC
  Filled 2015-05-19: qty 2

## 2015-05-19 MED ORDER — MIDAZOLAM HCL 2 MG/2ML IJ SOLN
INTRAMUSCULAR | Status: AC
  Filled 2015-05-19: qty 2

## 2015-05-19 MED ORDER — SODIUM CHLORIDE 0.9% FLUSH: 3.0000 mL | Freq: Two times a day (BID) | INTRAVENOUS | Status: DC

## 2015-05-19 MED ORDER — HEPARIN SODIUM (PORCINE) 1000 UNIT/ML IJ SOLN
INTRAMUSCULAR | Status: DC | PRN
  Administered 2015-05-19: 6000 [IU] via INTRAVENOUS

## 2015-05-19 MED ORDER — VERAPAMIL HCL 2.5 MG/ML IV SOLN
INTRAVENOUS | Status: AC
Start: 2015-05-19 — End: 2015-05-19
  Filled 2015-05-19: qty 2

## 2015-05-19 MED ORDER — FENTANYL CITRATE (PF) 100 MCG/2ML IJ SOLN
INTRAMUSCULAR | Status: DC | PRN
  Administered 2015-05-19 (×2): 50 ug via INTRAVENOUS

## 2015-05-19 MED ORDER — ACETAMINOPHEN 325 MG PO TABS: 650.0000 mg | ORAL_TABLET | ORAL | Status: DC | PRN

## 2015-05-19 MED ORDER — SODIUM CHLORIDE 0.9% FLUSH: 3.0000 mL | INTRAVENOUS | Status: DC | PRN

## 2015-05-19 MED ORDER — ONDANSETRON HCL 4 MG/2ML IJ SOLN: 4.0000 mg | Freq: Four times a day (QID) | INTRAMUSCULAR | Status: DC | PRN

## 2015-05-19 MED ORDER — LIDOCAINE HCL (PF) 1 % IJ SOLN
INTRAMUSCULAR | Status: AC
Start: 2015-05-19 — End: 2015-05-19
  Filled 2015-05-19: qty 30

## 2015-05-19 MED ORDER — LIDOCAINE HCL (PF) 1 % IJ SOLN
INTRAMUSCULAR | Status: DC | PRN
  Administered 2015-05-19: 5 mL
  Administered 2015-05-19: 2 mL

## 2015-05-19 MED ORDER — VERAPAMIL HCL 2.5 MG/ML IV SOLN
INTRAVENOUS | Status: AC
  Filled 2015-05-19: qty 2

## 2015-05-19 MED ORDER — HEPARIN SODIUM (PORCINE) 1000 UNIT/ML IJ SOLN
INTRAMUSCULAR | Status: AC
  Filled 2015-05-19: qty 1

## 2015-05-19 MED ORDER — SODIUM CHLORIDE 0.9 % IV SOLN
INTRAVENOUS | Status: DC
Start: 2015-05-20 — End: 2015-05-19
  Administered 2015-05-19: 06:00:00 via INTRAVENOUS

## 2015-05-19 MED ORDER — MIDAZOLAM HCL 2 MG/2ML IJ SOLN
INTRAMUSCULAR | Status: DC | PRN
  Administered 2015-05-19 (×2): 1 mg via INTRAVENOUS

## 2015-05-19 MED ORDER — SODIUM CHLORIDE 0.9 % WEIGHT BASED INFUSION: 3.0000 mL/kg/h | INTRAVENOUS | Status: DC

## 2015-05-19 MED ORDER — IOPAMIDOL (ISOVUE-370) INJECTION 76%
INTRAVENOUS | Status: DC | PRN
  Administered 2015-05-19: 80 mL via INTRAVENOUS

## 2015-05-19 MED ORDER — SODIUM CHLORIDE 0.9 % IV SOLN
250.0000 mL | INTRAVENOUS | Status: DC | PRN
Start: 2015-05-19 — End: 2015-05-19

## 2015-05-19 MED ORDER — HEPARIN (PORCINE) IN NACL 2-0.9 UNIT/ML-% IJ SOLN
INTRAMUSCULAR | Status: AC
  Filled 2015-05-19: qty 1500

## 2015-05-19 MED ORDER — ASPIRIN 81 MG PO CHEW: 81.0000 mg | CHEWABLE_TABLET | ORAL | Status: DC

## 2015-05-19 SURGICAL SUPPLY — 12 items
CATH BALLN WEDGE 5F 110CM (CATHETERS) ×3 IMPLANT
CATH INFINITI 5 FR JL3.5 (CATHETERS) ×3 IMPLANT
CATH INFINITI JR4 5F (CATHETERS) ×3 IMPLANT
DEVICE RAD COMP TR BAND LRG (VASCULAR PRODUCTS) ×3 IMPLANT
GLIDESHEATH SLEND A-KIT 6F 22G (SHEATH) ×3 IMPLANT
KIT HEART LEFT (KITS) ×3 IMPLANT
KIT HEART RIGHT NAMIC (KITS) ×3 IMPLANT
PACK CARDIAC CATHETERIZATION (CUSTOM PROCEDURE TRAY) ×3 IMPLANT
SHEATH FAST CATH BRACH 5F 5CM (SHEATH) ×3 IMPLANT
TRANSDUCER W/STOPCOCK (MISCELLANEOUS) ×6 IMPLANT
TUBING CIL FLEX 10 FLL-RA (TUBING) ×3 IMPLANT
WIRE SAFE-T 1.5MM-J .035X260CM (WIRE) ×3 IMPLANT

## 2015-05-19 NOTE — Progress Notes (Signed)
Right AC peripheral IV d/c'd in cath lab, site WNL.

## 2015-05-19 NOTE — Discharge Instructions (Signed)
Radial Site Care °Refer to this sheet in the next few weeks. These instructions provide you with information about caring for yourself after your procedure. Your health care provider may also give you more specific instructions. Your treatment has been planned according to current medical practices, but problems sometimes occur. Call your health care provider if you have any problems or questions after your procedure. °WHAT TO EXPECT AFTER THE PROCEDURE °After your procedure, it is typical to have the following: °· Bruising at the radial site that usually fades within 1-2 weeks. °· Blood collecting in the tissue (hematoma) that may be painful to the touch. It should usually decrease in size and tenderness within 1-2 weeks. °HOME CARE INSTRUCTIONS °· Take medicines only as directed by your health care provider. °· You may shower 24-48 hours after the procedure or as directed by your health care provider. Remove the bandage (dressing) and gently wash the site with plain soap and water. Pat the area dry with a clean towel. Do not rub the site, because this may cause bleeding. °· Do not take baths, swim, or use a hot tub until your health care provider approves. °· Check your insertion site every day for redness, swelling, or drainage. °· Do not apply powder or lotion to the site. °· Do not flex or bend the affected arm for 24 hours or as directed by your health care provider. °· Do not push or pull heavy objects with the affected arm for 24 hours or as directed by your health care provider. °· Do not lift over 10 lb (4.5 kg) for 5 days after your procedure or as directed by your health care provider. °· Ask your health care provider when it is okay to: °¨ Return to work or school. °¨ Resume usual physical activities or sports. °¨ Resume sexual activity. °· Do not drive home if you are discharged the same day as the procedure. Have someone else drive you. °· You may drive 24 hours after the procedure unless otherwise  instructed by your health care provider. °· Do not operate machinery or power tools for 24 hours after the procedure. °· If your procedure was done as an outpatient procedure, which means that you went home the same day as your procedure, a responsible adult should be with you for the first 24 hours after you arrive home. °· Keep all follow-up visits as directed by your health care provider. This is important. °SEEK MEDICAL CARE IF: °· You have a fever. °· You have chills. °· You have increased bleeding from the radial site. Hold pressure on the site. °SEEK IMMEDIATE MEDICAL CARE IF: °· You have unusual pain at the radial site. °· You have redness, warmth, or swelling at the radial site. °· You have drainage (other than a small amount of blood on the dressing) from the radial site. °· The radial site is bleeding, and the bleeding does not stop after 30 minutes of holding steady pressure on the site. °· Your arm or hand becomes pale, cool, tingly, or numb. °  °This information is not intended to replace advice given to you by your health care provider. Make sure you discuss any questions you have with your health care provider. °  °Document Released: 01/20/2010 Document Revised: 01/08/2014 Document Reviewed: 07/06/2013 °Elsevier Interactive Patient Education ©2016 Elsevier Inc[employer] ° ° ° ° °Angiogram, Care After °Refer to this sheet in the next few weeks. These instructions provide you with information about caring for yourself after your procedure.   Your health care provider may also give you more specific instructions. Your treatment has been planned according to current medical practices, but problems sometimes occur. Call your health care provider if you have any problems or questions after your procedure. °WHAT TO EXPECT AFTER THE PROCEDURE °After your procedure, it is typical to have the following: °· Bruising at the catheter insertion site that usually fades within 1-2 weeks. °· Blood collecting in the tissue  (hematoma) that may be painful to the touch. It should usually decrease in size and tenderness within 1-2 weeks. °HOME CARE INSTRUCTIONS °· Take medicines only as directed by your health care provider. °· You may shower 24-48 hours after the procedure or as directed by your health care provider. Remove the bandage (dressing) and gently wash the site with plain soap and water. Pat the area dry with a clean towel. Do not rub the site, because this may cause bleeding. °· Do not take baths, swim, or use a hot tub until your health care provider approves. °· Check your insertion site every day for redness, swelling, or drainage. °· Do not apply powder or lotion to the site. °· Do not lift over 10 lb (4.5 kg) for 5 days after your procedure or as directed by your health care provider. °· Ask your health care provider when it is okay to: °¨ Return to work or school. °¨ Resume usual physical activities or sports. °¨ Resume sexual activity. °· Do not drive home if you are discharged the same day as the procedure. Have someone else drive you. °· You may drive 24 hours after the procedure unless otherwise instructed by your health care provider. °· Do not operate machinery or power tools for 24 hours after the procedure or as directed by your health care provider. °· If your procedure was done as an outpatient procedure, which means that you went home the same day as your procedure, a responsible adult should be with you for the first 24 hours after you arrive home. °· Keep all follow-up visits as directed by your health care provider. This is important. °SEEK MEDICAL CARE IF: °· You have a fever. °· You have chills. °· You have increased bleeding from the catheter insertion site. Hold pressure on the site. °SEEK IMMEDIATE MEDICAL CARE IF: °· You have unusual pain at the catheter insertion site. °· You have redness, warmth, or swelling at the catheter insertion site. °· You have drainage (other than a small amount of blood on  the dressing) from the catheter insertion site. °· The catheter insertion site is bleeding, and the bleeding does not stop after 30 minutes of holding steady pressure on the site. °· The area near or just beyond the catheter insertion site becomes pale, cool, tingly, or numb. °  °This information is not intended to replace advice given to you by your health care provider. Make sure you discuss any questions you have with your health care provider. °  °Document Released: 07/06/2004 Document Revised: 01/08/2014 Document Reviewed: 05/21/2012 °Elsevier Interactive Patient Education ©2016 Elsevier Inc. ° ° °

## 2015-05-19 NOTE — Interval H&P Note (Signed)
Cath Lab Visit (complete for each Cath Lab visit)  Clinical Evaluation Leading to the Procedure:   ACS: No.  Non-ACS:    Anginal Classification: CCS II  Anti-ischemic medical therapy: Maximal Therapy (2 or more classes of medications)  Non-Invasive Test Results: No non-invasive testing performed  Prior CABG: No previous CABG      History and Physical Interval Note:  05/19/2015 7:32 AM  Benjamin [name]  has presented today for surgery, with the diagnosis of hf  The various methods of treatment have been discussed with the patient and family. After consideration of risks, benefits and other options for treatment, the patient has consented to  Procedure(s): Right/Left Heart Cath and Coronary Angiography (N/A) as a surgical intervention .  The patient's history has been reviewed, patient examined, no change in status, stable for surgery.  I have reviewed the patient's chart and labs.  Questions were answered to the patient's satisfaction.     Belva Crome III

## 2015-05-19 NOTE — H&P (View-Only) (Signed)
Patient ID: Benjamin [name], male   DOB: 1950-02-24, 65 y.o.   MRN: [id number]     Clinical Summary Benjamin [name] is a 65 y.o.male seen today as a new patient he is referred by Dr Benjamin [name] for the following medical problems.  1. CAD - previous notes mention MI in 2001 at [facility], cannot find cath report. Patient has stent card that shows he received a stent to his LAD at that time.  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 - denies any SOB, LE edema, or DOE.      SH: applying to drive school bus.  Past Medical History  Diagnosis Date  . Allergy   . Myocardial infarction ([place]) 04/20/1999  . ASCVD (arteriosclerotic cardiovascular disease)   . Hyperlipidemia   . Hypertension      Allergies  Allergen Reactions  . Penicillins      Current Outpatient Prescriptions  Medication Sig Dispense Refill  . aspirin 81 MG tablet Take 81 mg by mouth daily. Reported on 05/05/2015    . atorvastatin (LIPITOR) 80 MG tablet TAKE 1 TABLET (80 MG TOTAL) BY MOUTH DAILY AT 6 PM. 90 tablet 0  . benzonatate (TESSALON) 100 MG capsule Take 2 capsules (200 mg total) by mouth 3 (three) times daily as needed for cough. 30 capsule 0  . carvedilol (COREG) 3.125 MG tablet TAKE 1 TABLET (3.125 MG TOTAL) BY MOUTH 2 (TWO) TIMES DAILY WITH A MEAL. 180 tablet 0  . Cholecalciferol (VITAMIN D) 2000 UNITS CAPS Take 1 capsule by mouth daily.     Marland Kitchen losartan (COZAAR) 100 MG tablet Take 1 tablet (100 mg total) by mouth daily. 90 tablet 3   No current facility-administered medications for this visit.     Past Surgical History  Procedure Laterality Date  . Coronary stent placement  04/18/1999     Allergies  Allergen Reactions  . Penicillins      Family History He denies any family history of heart disease   Social  History Benjamin [name] reports that he quit smoking about 57 years ago. He has never used smokeless tobacco. Benjamin [name] reports that he does not drink alcohol.   Review of Systems CONSTITUTIONAL: No weight loss, fever, chills, weakness or fatigue.  HEENT: Eyes: No visual loss, blurred vision, double vision or yellow sclerae.No hearing loss, sneezing, congestion, runny nose or sore throat.  SKIN: No rash or itching.  CARDIOVASCULAR: per HPI RESPIRATORY: No shortness of breath, cough or sputum.  GASTROINTESTINAL: No anorexia, nausea, vomiting or diarrhea. No abdominal pain or blood.  GENITOURINARY: No burning on urination, no polyuria NEUROLOGICAL: No headache, dizziness, syncope, paralysis, ataxia, numbness or tingling in the extremities. No change in bowel or bladder control.  MUSCULOSKELETAL: No muscle, back pain, joint pain or stiffness.  LYMPHATICS: No enlarged nodes. No history of splenectomy.  PSYCHIATRIC: No history of depression or anxiety.  ENDOCRINOLOGIC: No reports of sweating, cold or heat intolerance. No polyuria or polydipsia.  Marland Kitchen   Physical Examination Filed Vitals:   05/16/15 1251  BP: 124/80  Pulse: 59   Filed Vitals:   05/16/15 1251  Height: 5\' 10"  (1.778 m)  Weight: 262 lb (118.842 kg)    Gen: resting comfortably, no acute distress HEENT: no scleral icterus, pupils equal round and  reactive, no palptable cervical adenopathy,  CV: RRR, no m/r/g, no jvd Resp: Clear to auscultation bilaterally GI: abdomen is soft, non-tender, non-distended, normal bowel sounds, no hepatosplenomegaly MSK: extremities are warm, no edema.  Skin: warm, no rash Neuro:  no focal deficits Psych: appropriate affect   Diagnostic Studies Study Conclusions  - Left ventricle: The cavity size was normal. Wall thickness was  normal. Systolic function was severely reduced. The estimated  ejection fraction was in the range of 25% to 30%. Diffuse  hypokinesis. Doppler parameters are  consistent with abnormal left  ventricular relaxation (grade 1 diastolic dysfunction). - Regional wall motion abnormality: Akinesis of the mid anterior,  basal-mid anteroseptal, and apical myocardium. - Aortic valve: Mildly calcified annulus. Trileaflet; mildly  thickened leaflets. Valve area (VTI): 2.42 cm^2. Valve area  (Vmax): 2.32 cm^2. - Atrial septum: No defect or patent foramen ovale was identified. - Technically difficult study. Echocontrast was used to enhance  visualization.    Assessment and Plan  1. CAD/Chronic systolic heart failure - history of stent to LAD in 2001, since that time no cardiac testing - recent echo shows new diagnosis of sysotlic dysfunciton, LVEF 25-30% - suspect probable ICM, he will be referred for LHC/RHC - we will increase coreg to 6.25mg  bid. We will stop losartan and start entresto.  - add TSH to precath labs   F/u 3 weeks.        Benjamin [name], M.D.

## 2015-06-13 ENCOUNTER — Ambulatory Visit (INDEPENDENT_AMBULATORY_CARE_PROVIDER_SITE_OTHER): Payer: BC Managed Care – PPO | Admitting: Cardiology

## 2015-06-13 ENCOUNTER — Encounter: Payer: Self-pay | Admitting: Cardiology

## 2015-06-13 VITALS — BP 123/79 | HR 63 | Ht 70.0 in | Wt 270.8 lb

## 2015-06-13 DIAGNOSIS — G4730 Sleep apnea, unspecified: Secondary | ICD-10-CM

## 2015-06-13 DIAGNOSIS — I5022 Chronic systolic (congestive) heart failure: Secondary | ICD-10-CM

## 2015-06-13 DIAGNOSIS — I2510 Atherosclerotic heart disease of native coronary artery without angina pectoris: Secondary | ICD-10-CM

## 2015-06-13 MED ORDER — CARVEDILOL 12.5 MG PO TABS: 12.5000 mg | ORAL_TABLET | Freq: Two times a day (BID) | ORAL | Status: DC

## 2015-06-13 NOTE — Patient Instructions (Signed)
Your physician recommends that you schedule a follow-up appointment in: [place] [address] [place]  Your physician has recommended you make the following change in your medication:   TAKE COREG 12.5 MG TWICE DAILY  You have been referred to DR. HAWKINS  Thank you for choosing [facility]!!

## 2015-06-13 NOTE — Progress Notes (Signed)
Patient ID: Tilmon Bosarge, male   DOB: [birth year], 65 y.o.   MRN: [id number]     Clinical Summary Mr. Wildeman is a 65 y.o.male seen today for follow up of the following medical problems.   1. CAD - previous notes mention MI in 2001 at [facility], cannot find cath report. Patient has stent card that shows he received a stent to his LAD at that time.  - in setting of newly diagnosed LV systolic dysfunction recently, he had repeat cath 05/2015 that showed patent coronaries.  - denies any recent chest pain  2. 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 - 05/2015 cath showed patent coronaries. Normal filling pressures at that time.  - denies any SOB, LE edema, or DOE.   - last visit started on entresto, coreg increased to 6.25mg  bid. Tolerating well without side effects   3. OSA screen - mixed compliance with CPAP, mainly due to discomfort   Past Medical History  Diagnosis Date  . Allergy   . 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 ASCVD (arteriosclerotic cardiovascular disease)   . Hyperlipidemia   . Hypertension      Allergies  Allergen Reactions  . Penicillins      Current Outpatient Prescriptions  Medication Sig Dispense Refill  . aspirin 81 MG tablet Take 81 mg by mouth daily. Reported on 05/05/2015    . atorvastatin (LIPITOR) 80 MG tablet TAKE 1 TABLET (80 MG TOTAL) BY MOUTH DAILY AT 6 PM. 90 tablet 0  . carvedilol (COREG) 6.25 MG tablet Take 1 tablet (6.25 mg total) by mouth 2 (two) times daily. (Patient taking differently: Take 12.5 mg by mouth 2 (two) times daily. ) 180 tablet 3  . Cholecalciferol (VITAMIN D) 2000 UNITS CAPS Take 1 capsule by mouth daily.     . sacubitril-valsartan (ENTRESTO) 49-51 MG Take 1 tablet by mouth 2 (two) times daily. 60 tablet 6   No current facility-administered  medications for this visit.     Past Surgical History  Procedure Laterality Date  . Coronary stent placement  04/18/1999  . Cardiac catheterization N/A 05/19/2015    Procedure: Right/Left Heart Cath and Coronary Angiography;  Surgeon: Belva Crome, MD;  Location: [place] [facility];  Service: Cardiovascular;  Laterality: N/A;     Allergies  Allergen Reactions  . Penicillins       Family history is unknown by patient.   Social History Mr. Bladow reports that he quit smoking about 57 years ago. He has never used smokeless tobacco. Mr. Mccallie reports that he does not drink alcohol.   Review of Systems CONSTITUTIONAL: No weight loss, fever, chills, weakness or fatigue.  HEENT: Eyes: No visual loss, blurred vision, double vision or yellow sclerae.No hearing loss, sneezing, congestion, runny nose or sore throat.  SKIN: No rash or itching.  CARDIOVASCULAR: per HPI RESPIRATORY: No shortness of breath, cough or sputum.  GASTROINTESTINAL: No anorexia, nausea, vomiting or diarrhea. No abdominal pain or blood.  GENITOURINARY: No burning on urination, no polyuria NEUROLOGICAL: No headache, dizziness, syncope, paralysis, ataxia, numbness or tingling in the extremities. No change in bowel or bladder control.  MUSCULOSKELETAL: No muscle, back pain, joint pain or stiffness.  LYMPHATICS: No enlarged nodes. No history of splenectomy.  PSYCHIATRIC: No history of depression or anxiety.  ENDOCRINOLOGIC: No reports of  sweating, cold or heat intolerance. No polyuria or polydipsia.  Marland Kitchen   Physical Examination Filed Vitals:   06/13/15 0904  BP: 123/79  Pulse: 63   Filed Vitals:   06/13/15 0904  Height: 5\' 10"  (1.778 m)  Weight: 270 lb 12.8 oz (122.834 kg)    Gen: resting comfortably, no acute distress HEENT: no scleral icterus, pupils equal round and reactive, no palptable cervical adenopathy,  CV: RRR, no m/r/g, no jvd Resp: Clear to auscultation bilaterally GI: abdomen is soft,  non-tender, non-distended, normal bowel sounds, no hepatosplenomegaly MSK: extremities are warm, no edema.  Skin: warm, no rash Neuro:  no focal deficits Psych: appropriate affect   Diagnostic Studies 05/2015 Cath 1. Prox RCA to Mid RCA lesion, 20% stenosed. 2. Ost LAD to Prox LAD lesion, 30% stenosed. The lesion was previously treated with a stent (unknown type). 3. Dist Cx lesion, 50% stenosed.   Severe anterior apical hypokinesis. Estimated ejection fraction 35%. Normal left ventricular hemodynamics with EDP of 14 mmHg and mean capillary wedge 17 mmHg.  Widely patent coronary arteries with 30% diffuse ISR in the proximal LAD stent. There is 50% eccentric stenosis in the distal circumflex. Luminal irregularities are noted in the right coronary.  RECOMMENDATIONS:   Aggressive risk factor modification to prevent progression of CAD  Aggressive heart failure therapy to minimize progression of LV dysfunction.    05/2015 Echo Study Conclusions  - Left ventricle: The cavity size was normal. Wall thickness was  normal. Systolic function was severely reduced. The estimated  ejection fraction was in the range of 25% to 30%. Diffuse  hypokinesis. Doppler parameters are consistent with abnormal left  ventricular relaxation (grade 1 diastolic dysfunction). - Regional wall motion abnormality: Akinesis of the mid anterior,  basal-mid anteroseptal, and apical myocardium. - Aortic valve: Mildly calcified annulus. Trileaflet; mildly  thickened leaflets. Valve area (VTI): 2.42 cm^2. Valve area  (Vmax): 2.32 cm^2. - Atrial septum: No defect or patent foramen ovale was identified. - Technically difficult study. Echocontrast was used to enhance  visualization.   Assessment and Plan   1. CAD - history of stent to LAD in 2001 - repeat cath in setting of newly diagnosed systolic heart failure shows patent coronaries - no current symptoms, continue current meds   2. Chronic  systolic heart failure/NICM - recent echo shows new diagnosis of sysotlic dysfunciton, LVEF 25-30%. He is NYHA I-II.  - we will increase coreg to 12.5mg  bid - 3-6 month trial of medical therapy, then we will repeat echo for possible ICD consideration.    3. OSA - mixed compliance with CPAP, mainly due to discomfort, He has never tried different mask fittings. In setting of systolic heart failure important his OSA is adequately treated - we will refer to Dr Luan Pulling to help with management.    Arnoldo Lenis, M.D.

## 2015-06-22 ENCOUNTER — Ambulatory Visit: Payer: Self-pay | Admitting: Cardiology

## 2015-07-12 ENCOUNTER — Ambulatory Visit: Payer: BC Managed Care – PPO | Admitting: Cardiology

## 2015-07-20 ENCOUNTER — Encounter: Payer: Self-pay | Admitting: Cardiology

## 2015-07-20 ENCOUNTER — Ambulatory Visit (INDEPENDENT_AMBULATORY_CARE_PROVIDER_SITE_OTHER): Payer: BC Managed Care – PPO | Admitting: Cardiology

## 2015-07-20 VITALS — BP 96/65 | HR 56 | Ht 70.0 in | Wt 272.0 lb

## 2015-07-20 DIAGNOSIS — I2510 Atherosclerotic heart disease of native coronary artery without angina pectoris: Secondary | ICD-10-CM

## 2015-07-20 DIAGNOSIS — I5022 Chronic systolic (congestive) heart failure: Secondary | ICD-10-CM

## 2015-07-20 NOTE — Progress Notes (Signed)
Clinical Summary Benjamin [name] is a 65 y.o.male seen today for follow up of the following medical problems  1. CAD - previous notes mention MI in 2001 at [facility], cannot find cath report. Patient has stent card that shows he received a stent to his LAD at that time.  - in setting of newly diagnosed LV systolic dysfunction recently, he had repeat cath 05/2015 that showed patent coronaries.  - denies any recent chest pain since last visit - compliant with meds  2. Chronic systolic heart failure/NICM - echo showed an LVEF 25-30%, this is a new finding of systolic dysfunction. - 05/2015 cath showed patent coronaries. Normal filling pressures at that time.  - denies any SOB, LE edema, or DOE.    - last visit we increased coreg to 12.5mg  bid. Tolerating with mild side effects that are tolerable - no recent LE edema. Home weights stabel 265-270 lbs.    3. OSA screen - mixed compliance with CPAP, mainly due to discomfort -followed by Dr Luan Pulling    Past Medical History  Diagnosis Date  . Allergy   . 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 ASCVD (arteriosclerotic cardiovascular disease)   . Hyperlipidemia   . Hypertension      Allergies  Allergen Reactions  . Penicillins      Current Outpatient Prescriptions  Medication Sig Dispense Refill  . aspirin 81 MG tablet Take 81 mg by mouth daily. Reported on 05/05/2015    . atorvastatin (LIPITOR) 80 MG tablet TAKE 1 TABLET (80 MG TOTAL) BY MOUTH DAILY AT 6 PM. 90 tablet 0  . carvedilol (COREG) 12.5 MG tablet Take 1 tablet (12.5 mg total) by mouth 2 (two) times daily. 180 tablet 3  . Cholecalciferol (VITAMIN D) 2000 UNITS CAPS Take 1 capsule by mouth daily.     . sacubitril-valsartan (ENTRESTO) 49-51 MG Take 1 tablet by mouth 2 (two) times daily.     No current facility-administered medications for this visit.     Past Surgical History  Procedure Laterality Date  . Coronary stent placement  04/18/1999  . Cardiac  catheterization N/A 05/19/2015    Procedure: Right/Left Heart Cath and Coronary Angiography;  Surgeon: Belva Crome, MD;  Location: [place] [facility];  Service: Cardiovascular;  Laterality: N/A;     Allergies  Allergen Reactions  . Penicillins       Family History  Problem Relation Age of Onset  . Family history unknown: Yes     Social History Mr. Barrons reports that he quit smoking about 57 years ago. He has never used smokeless tobacco. Mr. Brazill reports that he does not drink alcohol.   Review of Systems CONSTITUTIONAL: No weight loss, fever, chills, weakness or fatigue.  HEENT: Eyes: No visual loss, blurred vision, double vision or yellow sclerae.No hearing loss, sneezing, congestion, runny nose or sore throat.  SKIN: No rash or itching.  CARDIOVASCULAR: per HPI RESPIRATORY: No shortness of breath, cough or sputum.  GASTROINTESTINAL: No anorexia, nausea, vomiting or diarrhea. No abdominal pain or blood.  GENITOURINARY: No burning on urination, no polyuria NEUROLOGICAL: No headache, dizziness, syncope, paralysis, ataxia, numbness or tingling in the extremities. No change in bowel or bladder control.  MUSCULOSKELETAL: No muscle, back pain, joint pain or stiffness.  LYMPHATICS: No enlarged nodes. No history of splenectomy.  PSYCHIATRIC: No history of depression or anxiety.  ENDOCRINOLOGIC: No reports of sweating, cold or heat intolerance. No polyuria or polydipsia.  Marland Kitchen   Physical Examination Filed Vitals:  07/20/15 0938  BP: 96/65  Pulse: 56   Filed Vitals:   07/20/15 0938  Height: 5\' 10"  (1.778 m)  Weight: 272 lb (123.378 kg)    Gen: resting comfortably, no acute distress HEENT: no scleral icterus, pupils equal round and reactive, no palptable cervical adenopathy,  CV: RRR, no m/r/g, no jvd Resp: Clear to auscultation bilaterally GI: abdomen is soft, non-tender, non-distended, normal bowel sounds, no hepatosplenomegaly MSK: extremities are warm, no edema.   Skin: warm, no rash Neuro:  no focal deficits Psych: appropriate affect   Diagnostic Studies 05/2015 Cath 1. Prox RCA to Mid RCA lesion, 20% stenosed. 2. Ost LAD to Prox LAD lesion, 30% stenosed. The lesion was previously treated with a stent (unknown type). 3. Dist Cx lesion, 50% stenosed.   Severe anterior apical hypokinesis. Estimated ejection fraction 35%. Normal left ventricular hemodynamics with EDP of 14 mmHg and mean capillary wedge 17 mmHg.  Widely patent coronary arteries with 30% diffuse ISR in the proximal LAD stent. There is 50% eccentric stenosis in the distal circumflex. Luminal irregularities are noted in the right coronary.  RECOMMENDATIONS:   Aggressive risk factor modification to prevent progression of CAD  Aggressive heart failure therapy to minimize progression of LV dysfunction.   05/2015 Echo Study Conclusions  - Left ventricle: The cavity size was normal. Wall thickness was  normal. Systolic function was severely reduced. The estimated  ejection fraction was in the range of 25% to 30%. Diffuse  hypokinesis. Doppler parameters are consistent with abnormal left  ventricular relaxation (grade 1 diastolic dysfunction). - Regional wall motion abnormality: Akinesis of the mid anterior,  basal-mid anteroseptal, and apical myocardium. - Aortic valve: Mildly calcified annulus. Trileaflet; mildly  thickened leaflets. Valve area (VTI): 2.42 cm^2. Valve area  (Vmax): 2.32 cm^2. - Atrial septum: No defect or patent foramen ovale was identified. - Technically difficult study. Echocontrast was used to enhance  visualization.  Assessment and Plan  1. CAD - history of stent to LAD in 2001 - repeat cath in setting of newly diagnosed systolic heart failure shows patent coronaries - no current symptoms - we will continue current meds   2. Chronic systolic heart failure/NICM - recent echo shows new diagnosis of sysotlic dysfunciton, LVEF 25-30%. He  is NYHA I-II.  - soft bp's and low heart rates today, no further medication titration at this time.  - 3-6 month trial of medical therapy, then we will repeat echo for possible ICD consideration.    3. OSA - followed by Dr Luan Pulling   F/u 6 weeks.       Arnoldo Lenis, M.D.

## 2015-07-20 NOTE — Patient Instructions (Signed)
Your physician recommends that you schedule a follow-up appointment in: 6 weeks with Dr. Branch   Your physician recommends that you continue on your current medications as directed. Please refer to the Current Medication list given to you today.  Thank you for choosing [facility]!    

## 2015-08-30 ENCOUNTER — Other Ambulatory Visit: Payer: Self-pay | Admitting: *Deleted

## 2015-08-30 MED ORDER — SACUBITRIL-VALSARTAN 49-51 MG PO TABS: 1.0000 | ORAL_TABLET | Freq: Two times a day (BID) | ORAL | 3 refills | Status: DC

## 2015-09-06 ENCOUNTER — Encounter: Payer: Self-pay | Admitting: *Deleted

## 2015-09-07 ENCOUNTER — Encounter: Payer: Self-pay | Admitting: *Deleted

## 2015-09-07 ENCOUNTER — Encounter: Payer: Self-pay | Admitting: Cardiology

## 2015-09-07 ENCOUNTER — Ambulatory Visit (INDEPENDENT_AMBULATORY_CARE_PROVIDER_SITE_OTHER): Payer: BC Managed Care – PPO | Admitting: Cardiology

## 2015-09-07 VITALS — BP 109/67 | HR 60 | Ht 70.0 in | Wt 271.8 lb

## 2015-09-07 DIAGNOSIS — I5022 Chronic systolic (congestive) heart failure: Secondary | ICD-10-CM | POA: Diagnosis not present

## 2015-09-07 DIAGNOSIS — G4730 Sleep apnea, unspecified: Secondary | ICD-10-CM

## 2015-09-07 DIAGNOSIS — I2510 Atherosclerotic heart disease of native coronary artery without angina pectoris: Secondary | ICD-10-CM

## 2015-09-07 NOTE — Progress Notes (Signed)
Clinical Summary Mr. Sadri is a 65 y.o.male seen today for follow up of the following medical problems.  1. CAD - previous notes mention MI in 2001 at [facility], cannot find cath report. Patient has stent card that shows he received a stent to his LAD at that time.  - in setting of newly diagnosed LV systolic dysfunction recently, he had repeat cath 05/2015 that showed patent coronaries.   - no recent chest pain.    2. Chronic systolic heart failure/NICM - echo showed an LVEF 25-30%, this is a new finding of systolic dysfunction. - 05/2015 cath showed patent coronaries. Normal filling pressures at that time.  - denies any SOB, LE edema, or DOE.   - no recent edema. Home weights stable 265-270 lbs. No significant SOB or DOE.     3. OSA screen - mixed compliance with CPAP, mainly due to discomfort. His machine is several years old   Past Medical History:  Diagnosis Date  . Allergy   . ASCVD (arteriosclerotic cardiovascular disease)   . Hyperlipidemia   . Hypertension   . Myocardial infarction ([place]) 04/20/1999     Allergies  Allergen Reactions  . Penicillins Swelling     Current Outpatient Prescriptions  Medication Sig Dispense Refill  . aspirin 81 MG tablet Take 81 mg by mouth daily. Reported on 05/05/2015    . atorvastatin (LIPITOR) 80 MG tablet TAKE 1 TABLET (80 MG TOTAL) BY MOUTH DAILY AT 6 PM. 90 tablet 0  . carvedilol (COREG) 12.5 MG tablet Take 1 tablet (12.5 mg total) by mouth 2 (two) times daily. 180 tablet 3  . Cholecalciferol (VITAMIN D) 2000 UNITS CAPS Take 1 capsule by mouth daily.     . sacubitril-valsartan (ENTRESTO) 49-51 MG Take 1 tablet by mouth 2 (two) times daily. 180 tablet 3   No current facility-administered medications for this visit.      Past Surgical History:  Procedure Laterality Date  . CARDIAC CATHETERIZATION N/A 05/19/2015   Procedure: Right/Left Heart Cath and Coronary Angiography;  Surgeon: Belva Crome, MD;  Location:  [place] [facility];  Service: Cardiovascular;  Laterality: N/A;  . CORONARY STENT PLACEMENT  04/18/1999     Allergies  Allergen Reactions  . Penicillins Swelling      Family History  Problem Relation Age of Onset  . Family history unknown: Yes     Social History Mr. Jafari reports that he quit smoking about 41 years ago. His smoking use included Cigarettes. He started smoking about 49 years ago. He has a 4.00 pack-year smoking history. He has never used smokeless tobacco. Mr. Magnon reports that he does not drink alcohol.   Review of Systems CONSTITUTIONAL: No weight loss, fever, chills, weakness or fatigue.  HEENT: Eyes: No visual loss, blurred vision, double vision or yellow sclerae.No hearing loss, sneezing, congestion, runny nose or sore throat.  SKIN: No rash or itching.  CARDIOVASCULAR: per HPI RESPIRATORY: No shortness of breath, cough or sputum.  GASTROINTESTINAL: No anorexia, nausea, vomiting or diarrhea. No abdominal pain or blood.  GENITOURINARY: No burning on urination, no polyuria NEUROLOGICAL: No headache, dizziness, syncope, paralysis, ataxia, numbness or tingling in the extremities. No change in bowel or bladder control.  MUSCULOSKELETAL: No muscle, back pain, joint pain or stiffness.  LYMPHATICS: No enlarged nodes. No history of splenectomy.  PSYCHIATRIC: No history of depression or anxiety.  ENDOCRINOLOGIC: No reports of sweating, cold or heat intolerance. No polyuria or polydipsia.  Marland Kitchen   Physical  Examination Vitals:   09/07/15 1326  BP: 109/67  Pulse: 60   Vitals:   09/07/15 1326  Weight: 271 lb 12.8 oz (123.3 kg)  Height: 5\' 10"  (1.778 m)    Gen: resting comfortably, no acute distress HEENT: no scleral icterus, pupils equal round and reactive, no palptable cervical adenopathy,  CV: RRR, no m/r/g, no jvd Resp: Clear to auscultation bilaterally GI: abdomen is soft, non-tender, non-distended, normal bowel sounds, no hepatosplenomegaly MSK:  extremities are warm, no edema.  Skin: warm, no rash Neuro:  no focal deficits Psych: appropriate affect   Diagnostic Studies 05/2015 Cath 1. Prox RCA to Mid RCA lesion, 20% stenosed. 2. Ost LAD to Prox LAD lesion, 30% stenosed. The lesion was previously treated with a stent (unknown type). 3. Dist Cx lesion, 50% stenosed.   Severe anterior apical hypokinesis. Estimated ejection fraction 35%. Normal left ventricular hemodynamics with EDP of 14 mmHg and mean capillary wedge 17 mmHg.  Widely patent coronary arteries with 30% diffuse ISR in the proximal LAD stent. There is 50% eccentric stenosis in the distal circumflex. Luminal irregularities are noted in the right coronary.  RECOMMENDATIONS:   Aggressive risk factor modification to prevent progression of CAD  Aggressive heart failure therapy to minimize progression of LV dysfunction.   05/2015 Echo Study Conclusions  - Left ventricle: The cavity size was normal. Wall thickness was  normal. Systolic function was severely reduced. The estimated  ejection fraction was in the range of 25% to 30%. Diffuse  hypokinesis. Doppler parameters are consistent with abnormal left  ventricular relaxation (grade 1 diastolic dysfunction). - Regional wall motion abnormality: Akinesis of the mid anterior,  basal-mid anteroseptal, and apical myocardium. - Aortic valve: Mildly calcified annulus. Trileaflet; mildly  thickened leaflets. Valve area (VTI): 2.42 cm^2. Valve area  (Vmax): 2.32 cm^2. - Atrial septum: No defect or patent foramen ovale was identified. - Technically difficult study. Echocontrast was used to enhance  visualization.    Assessment and Plan  1. CAD - history of stent to LAD in 2001 - repeat cath in setting of newly diagnosed systolic heart failure shows patent coronaries - no current symptoms. We will continue current meds  2. Chronic systolic heart failure/NICM - recent echo shows new diagnosis of  sysotlic dysfunciton, LVEF 25-30%. He is NYHA I-II.  - he has been on medical therapy for 4 months, we will repeat echo. If persistent dysfunction further titrate meds starting with entresto, does not look to have the HR to increase beta blocker at this time. If LVEF <35% would need consideration for ICD.    3. OSA -  we will refer to Dr Radford Pax in [place] to help manage his OSA.    F/u 2 months       Arnoldo Lenis, M.D.

## 2015-09-07 NOTE — Patient Instructions (Signed)
Your physician recommends that you schedule a follow-up appointment in: 2 MONTHS WITH DR BRANCH  Your physician recommends that you continue on your current medications as directed. Please refer to the Current Medication list given to you today.  Your physician has requested that you have an echocardiogram. Echocardiography is a painless test that uses sound waves to create images of your heart. It provides your doctor with information about the size and shape of your heart and how well your heart's chambers and valves are working. This procedure takes approximately one hour. There are no restrictions for this procedure.  Thank you for choosing [facility]!!     

## 2015-09-09 ENCOUNTER — Other Ambulatory Visit: Payer: Self-pay

## 2015-09-09 DIAGNOSIS — G4733 Obstructive sleep apnea (adult) (pediatric): Secondary | ICD-10-CM

## 2015-09-09 NOTE — Progress Notes (Unsigned)
Placed referral for Dr. Fransico Him per Dr. Harl Bowie.

## 2015-09-29 ENCOUNTER — Ambulatory Visit (INDEPENDENT_AMBULATORY_CARE_PROVIDER_SITE_OTHER): Payer: BC Managed Care – PPO

## 2015-09-29 ENCOUNTER — Other Ambulatory Visit: Payer: Self-pay

## 2015-09-29 DIAGNOSIS — I5022 Chronic systolic (congestive) heart failure: Secondary | ICD-10-CM | POA: Diagnosis not present

## 2015-10-17 ENCOUNTER — Encounter: Payer: Self-pay | Admitting: Nurse Practitioner

## 2015-10-17 ENCOUNTER — Ambulatory Visit (INDEPENDENT_AMBULATORY_CARE_PROVIDER_SITE_OTHER): Payer: Worker's Compensation | Admitting: Nurse Practitioner

## 2015-10-17 VITALS — BP 97/67 | HR 56 | Temp 97.7°F | Ht 70.0 in | Wt 271.0 lb

## 2015-10-17 DIAGNOSIS — S61412A Laceration without foreign body of left hand, initial encounter: Secondary | ICD-10-CM

## 2015-10-17 NOTE — Progress Notes (Signed)
   Subjective:    Patient ID: Benjamin [name], male    DOB: Dec 06, 1950, 65 y.o.   MRN: [id number]  HPI DATE of INJURY 10/17/15  Emp: [facility]  Patient was working on a shreder and cut the palm of left hand with a screw driver and lacerated his palm. No numbnesss or tinging  Distally.  Review of Systems  Constitutional: Negative.   HENT: Negative.   Respiratory: Negative.   Cardiovascular: Negative.   Neurological: Negative.   Psychiatric/Behavioral: Negative.   All other systems reviewed and are negative.      Objective:   Physical Exam  Constitutional: He appears well-developed and well-nourished. No distress.  Cardiovascular: Normal rate and regular rhythm.   Pulmonary/Chest: Effort normal and breath sounds normal.  Skin: Skin is warm.  2cm superficial laceration to pam surface of left hand- mild bleeding- mo sutures needed   Procedure- cleaned with betadine  BENZOIN AND STERI STRIPS applied  Dressing applied       Assessment & Plan:   1. Laceration of left palm, initial encounter    Keep warm and dry Steri strip care discussed RTO prn  Mary-Margaret Hassell Done, FNP

## 2015-10-17 NOTE — Patient Instructions (Signed)
Dressing Change °A dressing is a material placed over wounds. It keeps the wound clean, dry, and protected from further injury. This provides an environment that favors wound healing.  °BEFORE YOU BEGIN °· Get your supplies together. Things you may need include: °¨ Saline solution. °¨ Flexible gauze dressing. °¨ Medicated cream. °¨ Tape. °¨ Gloves. °¨ Abdominal dressing pads. °¨ Gauze squares. °¨ Plastic bags. °· Take pain medicine 30 minutes before the dressing change if you need it. °· Take a shower before you do the first dressing change of the day. Use plastic wrap or a plastic bag to prevent the dressing from getting wet. °REMOVING YOUR OLD DRESSING  °· Wash your hands with soap and water. Dry your hands with a clean towel. °· Put on your gloves. °· Remove any tape. °· Carefully remove the old dressing. If the dressing sticks, you may dampen it with warm water to loosen it, or follow your caregiver's specific directions. °· Remove any gauze or packing tape that is in your wound. °· Take off your gloves. °· Put the gloves, tape, gauze, or any packing tape into a plastic bag. °CHANGING YOUR DRESSING °· Open the supplies. °· Take the cap off the saline solution. °· Open the gauze package so that the gauze remains on the inside of the package. °· Put on your gloves. °· Clean your wound as told by your caregiver. °· If you have been told to keep your wound dry, follow those instructions. °· Your caregiver may tell you to do one or more of the following: °¨ Pick up the gauze. Pour the saline solution over the gauze. Squeeze out the extra saline solution. °¨ Put medicated cream or other medicine on your wound if you have been told to do so. °¨ Put the solution soaked gauze only in your wound, not on the skin around it. °¨ Pack your wound loosely or as told by your caregiver. °¨ Put dry gauze on your wound. °¨ Put abdominal dressing pads over the dry gauze if your wet gauze soaks through. °· Tape the abdominal dressing  pads in place so they will not fall off. Do not wrap the tape completely around the affected part (arm, leg, abdomen). °· Wrap the dressing pads with a flexible gauze dressing to secure it in place. °· Take off your gloves. Put them in the plastic bag with the old dressing. Tie the bag shut and throw it away. °· Keep the dressing clean and dry until your next dressing change. °· Wash your hands. °SEEK MEDICAL CARE IF: °· Your skin around the wound looks red. °· Your wound feels more tender or sore. °· You see pus in the wound. °· Your wound smells bad. °· You have a fever. °· Your skin around the wound has a rash that itches and burns. °· You see black or yellow skin in your wound that was not there before. °· You feel nauseous, throw up, and feel very tired. °  °This information is not intended to replace advice given to you by your health care provider. Make sure you discuss any questions you have with your health care provider. °  °Document Released: 01/26/2004 Document Revised: 03/12/2011 Document Reviewed: 10/30/2010 °Elsevier Interactive Patient Education ©2016 [employer] ° °

## 2015-10-25 ENCOUNTER — Encounter: Payer: Self-pay | Admitting: Cardiology

## 2015-10-26 ENCOUNTER — Ambulatory Visit: Payer: BC Managed Care – PPO | Admitting: Cardiology

## 2015-10-27 ENCOUNTER — Encounter: Payer: Self-pay | Admitting: Cardiology

## 2015-10-27 ENCOUNTER — Ambulatory Visit (INDEPENDENT_AMBULATORY_CARE_PROVIDER_SITE_OTHER): Payer: BC Managed Care – PPO | Admitting: Cardiology

## 2015-10-27 VITALS — BP 102/64 | HR 58 | Ht 70.0 in | Wt 275.0 lb

## 2015-10-27 DIAGNOSIS — G4733 Obstructive sleep apnea (adult) (pediatric): Secondary | ICD-10-CM | POA: Diagnosis not present

## 2015-10-27 DIAGNOSIS — I2510 Atherosclerotic heart disease of native coronary artery without angina pectoris: Secondary | ICD-10-CM

## 2015-10-27 DIAGNOSIS — I5022 Chronic systolic (congestive) heart failure: Secondary | ICD-10-CM | POA: Diagnosis not present

## 2015-10-27 NOTE — Patient Instructions (Signed)

## 2015-10-27 NOTE — Progress Notes (Signed)
Clinical Summary Mr. Abila is a 65 y.o.male seen today for follow up of the following medical problems.  1. CAD - previous notes mention MI in 2001 at [facility], cannot find cath report. Patient has stent card that shows he received a stent to his LAD at that time.  - in setting of newly diagnosed LV systolic dysfunction recently, he had repeat cath 05/2015 that showed patent coronaries.   - no recent chest pain since last visit   2. Chronic systolic heart failure/NICM - echo showed an LVEF 25-30%, this is a new finding of systolic dysfunction. - 05/2015 cath showed patent coronaries. Normal filling pressures at that time.   - since last visit we repeated an echo 9/217, LVEF improved to 45-50%. Grade I diasotlic dysfunction. - no new symptoms - compliant with meds  3. OSA screen - mixed compliance with CPAP, mainly due to discomfort. His machine is several years old - has appointment coming up with Dr Radford Pax in the next few weeks.  Past Medical History:  Diagnosis Date  . Allergy   . ASCVD (arteriosclerotic cardiovascular disease)   . Hyperlipidemia   . Hypertension   . Myocardial infarction 04/20/1999     Allergies  Allergen Reactions  . Penicillins Swelling     Current Outpatient Prescriptions  Medication Sig Dispense Refill  . aspirin 81 MG tablet Take 81 mg by mouth daily. Reported on 05/05/2015    . atorvastatin (LIPITOR) 80 MG tablet TAKE 1 TABLET (80 MG TOTAL) BY MOUTH DAILY AT 6 PM. 90 tablet 0  . carvedilol (COREG) 12.5 MG tablet Take 1 tablet (12.5 mg total) by mouth 2 (two) times daily. 180 tablet 3  . Cholecalciferol (VITAMIN D) 2000 UNITS CAPS Take 1 capsule by mouth daily.     . sacubitril-valsartan (ENTRESTO) 49-51 MG Take 1 tablet by mouth 2 (two) times daily. 180 tablet 3   No current facility-administered medications for this visit.      Past Surgical History:  Procedure Laterality Date  . CARDIAC CATHETERIZATION N/A 05/19/2015   Procedure: Right/Left Heart Cath and Coronary Angiography;  Surgeon: Belva Crome, MD;  Location: [facility];  Service: Cardiovascular;  Laterality: N/A;  . CORONARY STENT PLACEMENT  04/18/1999     Allergies  Allergen Reactions  . Penicillins Swelling      Family History  Problem Relation Age of Onset  . Family history unknown: Yes     Social History Mr. Creger reports that he quit smoking about 41 years ago. His smoking use included Cigarettes. He started smoking about 49 years ago. He has a 4.00 pack-year smoking history. He has never used smokeless tobacco. Mr. Dibella reports that he does not drink alcohol.   Review of Systems CONSTITUTIONAL: No weight loss, fever, chills, weakness or fatigue.  HEENT: Eyes: No visual loss, blurred vision, double vision or yellow sclerae.No hearing loss, sneezing, congestion, runny nose or sore throat.  SKIN: No rash or itching.  CARDIOVASCULAR: per hpi RESPIRATORY: No shortness of breath, cough or sputum.  GASTROINTESTINAL: No anorexia, nausea, vomiting or diarrhea. No abdominal pain or blood.  GENITOURINARY: No burning on urination, no polyuria NEUROLOGICAL: No headache, dizziness, syncope, paralysis, ataxia, numbness or tingling in the extremities. No change in bowel or bladder control.  MUSCULOSKELETAL: No muscle, back pain, joint pain or stiffness.  LYMPHATICS: No enlarged nodes. No history of splenectomy.  PSYCHIATRIC: No history of depression or anxiety.  ENDOCRINOLOGIC: No reports of sweating, cold or  heat intolerance. No polyuria or polydipsia.  Marland Kitchen   Physical Examination Vitals:   10/27/15 1303  BP: 102/64  Pulse: (!) 58   Vitals:   10/27/15 1303  Weight: 275 lb (124.7 kg)  Height: 5\' 10"  (1.778 m)    Gen: resting comfortably, no acute distress HEENT: no scleral icterus, pupils equal round and reactive, no palptable cervical adenopathy,  CV: RRR, no m/r/g, no jvd Resp: Clear to auscultation bilaterally GI:  abdomen is soft, non-tender, non-distended, normal bowel sounds, no hepatosplenomegaly MSK: extremities are warm, no edema.  Skin: warm, no rash Neuro:  no focal deficits Psych: appropriate affect   Diagnostic Studies 05/2015 Cath 1. Prox RCA to Mid RCA lesion, 20% stenosed. 2. Ost LAD to Prox LAD lesion, 30% stenosed. The lesion was previously treated with a stent (unknown type). 3. Dist Cx lesion, 50% stenosed.   Severe anterior apical hypokinesis. Estimated ejection fraction 35%. Normal left ventricular hemodynamics with EDP of 14 mmHg and mean capillary wedge 17 mmHg.  Widely patent coronary arteries with 30% diffuse ISR in the proximal LAD stent. There is 50% eccentric stenosis in the distal circumflex. Luminal irregularities are noted in the right coronary.  RECOMMENDATIONS:   Aggressive risk factor modification to prevent progression of CAD  Aggressive heart failure therapy to minimize progression of LV dysfunction.   05/2015 Echo Study Conclusions  - Left ventricle: The cavity size was normal. Wall thickness was  normal. Systolic function was severely reduced. The estimated  ejection fraction was in the range of 25% to 30%. Diffuse  hypokinesis. Doppler parameters are consistent with abnormal left  ventricular relaxation (grade 1 diastolic dysfunction). - Regional wall motion abnormality: Akinesis of the mid anterior,  basal-mid anteroseptal, and apical myocardium. - Aortic valve: Mildly calcified annulus. Trileaflet; mildly  thickened leaflets. Valve area (VTI): 2.42 cm^2. Valve area  (Vmax): 2.32 cm^2. - Atrial septum: No defect or patent foramen ovale was identified. - Technically difficult study. Echocontrast was used to enhance  visualization.     Assessment and Plan  1. CAD - history of stent to LAD in 2001 - repeat cath in setting of newly diagnosed systolic heart failure shows patent coronaries - no recentsymptoms.  - continue current  meds  2. Chronic systolic heart failure/NICM - recent echo shows new diagnosis of sysotlic dysfunciton, LVEF 25-30%. He is NYHA I-II.  - repeat echo shows LVEF has improved and nearly normalized - no recent symptoms. Contiue current meds   3. OSA -has appt coming up with Dr Heron Nay.   F/u 6 months    Arnoldo Lenis, M.D., F.A.C.C.

## 2015-11-02 ENCOUNTER — Encounter: Payer: Self-pay | Admitting: Cardiology

## 2015-11-02 ENCOUNTER — Ambulatory Visit (INDEPENDENT_AMBULATORY_CARE_PROVIDER_SITE_OTHER): Payer: BC Managed Care – PPO | Admitting: Cardiology

## 2015-11-02 DIAGNOSIS — G4733 Obstructive sleep apnea (adult) (pediatric): Secondary | ICD-10-CM | POA: Diagnosis not present

## 2015-11-02 DIAGNOSIS — E669 Obesity, unspecified: Secondary | ICD-10-CM | POA: Insufficient documentation

## 2015-11-02 DIAGNOSIS — I10 Essential (primary) hypertension: Secondary | ICD-10-CM | POA: Diagnosis not present

## 2015-11-02 HISTORY — DX: Obstructive sleep apnea (adult) (pediatric): G47.33

## 2015-11-02 NOTE — Addendum Note (Signed)
Addended by: Harland German A on: 11/02/2015 04:28 PM   Modules accepted: Orders

## 2015-11-02 NOTE — Progress Notes (Signed)
Cardiology Office Note    Date:  11/02/2015   ID:  Benjamin [name], DOB [birth year], MRN [id number]  PCP:  Chevis Pretty, FNP  Cardiologist:  Fransico Him, MD   Chief Complaint  Patient presents with  . Sleep Apnea  . Hypertension    History of Present Illness:  Benjamin [name] is a 65 y.o. male with a history of HTN, hyperlipidemia and MI who is referred for sleep evaluation.  He apparently has a history of OSA but has not been compliant with his device.  He says that he was diagnosed with OSA 10 years ago and was on CPAP for 6-8 months and stopped it due to discomfort with the mask.  He subsequently restarted the device about 6-8 months ago after Dr. Harl Bowie encouraged him to restart it.  He uses a nasal mask which he is tolerating well.  He has no dry mouth.  He is interested in trying the nasal pillow mask.  He feels rested in the am but still gets tired during the day and occasionally naps 2-3 times a week.  He says that his wife says that he does not snore with the CPAP.      Past Medical History:  Diagnosis Date  . Allergy   . ASCVD (arteriosclerotic cardiovascular disease)   . Hyperlipidemia   . Hypertension   . Myocardial infarction 04/20/1999  . Obesity (BMI 30-39.9)   . OSA (obstructive sleep apnea) 11/02/2015    Past Surgical History:  Procedure Laterality Date  . CARDIAC CATHETERIZATION N/A 05/19/2015   Procedure: Right/Left Heart Cath and Coronary Angiography;  Surgeon: Belva Crome, MD;  Location: [facility];  Service: Cardiovascular;  Laterality: N/A;  . CORONARY STENT PLACEMENT  04/18/1999    Current Medications: Outpatient Medications Prior to Visit  Medication Sig Dispense Refill  . aspirin 81 MG tablet Take 81 mg by mouth daily. Reported on 05/05/2015    . atorvastatin (LIPITOR) 80 MG tablet TAKE 1 TABLET (80 MG TOTAL) BY MOUTH DAILY AT 6 PM. 90 tablet 0  . carvedilol (COREG) 12.5 MG tablet Take 1 tablet (12.5 mg total) by mouth 2 (two) times  daily. 180 tablet 3  . Cholecalciferol (VITAMIN D) 2000 UNITS CAPS Take 1 capsule by mouth daily.     . sacubitril-valsartan (ENTRESTO) 49-51 MG Take 1 tablet by mouth 2 (two) times daily. 180 tablet 3   No facility-administered medications prior to visit.      Allergies:   Penicillins   Social History   Social History  . Marital status: Married    Spouse name: N/A  . Number of children: N/A  . Years of education: N/A   Social History Main Topics  . Smoking status: Former Smoker    Packs/day: 0.50    Years: 8.00    Types: Cigarettes    Start date: 07/24/1966    Quit date: 07/24/1974  . Smokeless tobacco: Never Used  . Alcohol use No  . Drug use: No  . Sexual activity: Yes    Birth control/ protection: None   Other Topics Concern  . None   Social History Narrative  . None     Family History:  The patient's family history includes AAA (abdominal aortic aneurysm) in his father.   ROS:   Please see the history of present illness.    ROS All other systems reviewed and are negative.  No flowsheet data found.     PHYSICAL EXAM:   VS:  BP  108/74   Pulse 76   Ht 5\' 10"  (1.778 m)   Wt 271 lb (122.9 kg)   BMI 38.88 kg/m    GEN: Well nourished, well developed, in no acute distress  HEENT: normal  Neck: no JVD, carotid bruits, or masses Cardiac: RRR; no murmurs, rubs, or gallops,no edema.  Intact distal pulses bilaterally.  Respiratory:  clear to auscultation bilaterally, normal work of breathing GI: soft, nontender, nondistended, + BS MS: no deformity or atrophy  Skin: warm and dry, no rash Neuro:  Alert and Oriented x 3, Strength and sensation are intact Psych: euthymic mood, full affect  Wt Readings from Last 3 Encounters:  11/02/15 271 lb (122.9 kg)  10/27/15 275 lb (124.7 kg)  10/17/15 271 lb (122.9 kg)      Studies/Labs Reviewed:   EKG:  EKG is not ordered today.   Recent Labs: 11/30/2014: ALT 19 05/16/2015: BUN 12; Creatinine, Ser 1.00; Hemoglobin  15.1; Platelets 249; Potassium 4.5; Sodium 138; TSH 1.715   Lipid Panel    Component Value Date/Time   CHOL 133 11/30/2014 0827   TRIG 113 11/30/2014 0827   HDL 43 11/30/2014 0827   CHOLHDL 3.1 11/30/2014 0827   VLDL 23 11/30/2014 0827   LDLCALC 67 11/30/2014 0827    Additional studies/ records that were reviewed today include:  none    ASSESSMENT:    1. OSA (obstructive sleep apnea)   2. Benign essential HTN   3. Obesity (BMI 30-39.9)      PLAN:  In order of problems listed above:  OSA - the patient is tolerating PAP therapy well without any problems.  The patient has been using and benefiting from CPAP use and will continue to benefit from therapy. I will order him a ResMed Airfit P20 mask.  I will also see if insurance will pay for a new machine since his is 65 years old.  He may have to have a new split night study done and I will let him know.  HTN - BP controlled on current meds. Continue BB and ARB 3.   Obesity - I have encouraged him to get into a routine exercise program and cut back on carbs and portions.     Medication Adjustments/Labs and Tests Ordered: Current medicines are reviewed at length with the patient today.  Concerns regarding medicines are outlined above.  Medication changes, Labs and Tests ordered today are listed in the Patient Instructions below.  There are no Patient Instructions on file for this visit.   Signed, Fransico Him, MD  11/02/2015 4:05 PM    [facility] [address], [place], [place]  [address] Phone: [phone]; Fax: [phone]

## 2015-11-02 NOTE — Patient Instructions (Signed)
Medication Instructions:  Your physician recommends that you continue on your current medications as directed. Please refer to the Current Medication list given to you today.   Labwork: None  Testing/Procedures: None  Follow-Up: Your physician wants you to follow-up in: 1 year with Dr. Radford Pax. You will receive a reminder letter in the mail two months in advance. If you don't receive a letter, please call our office to schedule the follow-up appointment.   Any Other Special Instructions Will Be Listed Below (If Applicable). You will be called with an update to your treatment plan once we speak with [facility].    If you need a refill on your cardiac medications before your next appointment, please call your pharmacy.

## 2015-11-03 ENCOUNTER — Telehealth: Payer: Self-pay | Admitting: *Deleted

## 2015-11-03 NOTE — Telephone Encounter (Signed)
Received the following message from [facility] based on patients appointment,  We need to do some research on this one.   1) we need to talk to [facility] and get specifics on his device (ie: CPAP vs BiPAP and settings)  2) we need a download from [place]  3) we need to see if he is eligible for a new PAP machine  4) if he is NOT eligible, we need to know what we need to do to get him eligible  5) in the meantime, we have ordered a new mask (order is in EPIC to print and send)  6) once we know what type of device he has, a new supplies order will need to be placed   Since we didn't know any of this information at the time of the patient's appointment this afternoon, I told him one of you would talk to [place] and call him tomorrow afternoon with an update.   Thanks    [facility] states that they have not done any service for this patient since 2003 - He received a CPAP from them in 2002 set at 7cm H2O.    He is eligible for a new machine, but they will require a sleep study.        Routed to [place] to see if she wants to just keep him on the machine he is on, or have him go through the entire process and start over and get a new machine.

## 2015-11-04 ENCOUNTER — Other Ambulatory Visit: Payer: Self-pay | Admitting: *Deleted

## 2015-11-04 DIAGNOSIS — G4733 Obstructive sleep apnea (adult) (pediatric): Secondary | ICD-10-CM

## 2015-11-04 NOTE — Telephone Encounter (Signed)
Spoke to patients wife and she stated that the patient is aware that he had to repeat the sleep study and is not opposed to it

## 2015-11-04 NOTE — Telephone Encounter (Signed)
Please order a split night sleep study 

## 2015-11-07 ENCOUNTER — Ambulatory Visit: Payer: BC Managed Care – PPO | Admitting: Cardiology

## 2015-11-21 ENCOUNTER — Telehealth: Payer: Self-pay | Admitting: Cardiology

## 2015-11-21 NOTE — Telephone Encounter (Signed)
Benjamin [name] is calling to find out how long will the turn around be after the  Sleep Study , will he be able to get his Cpap machine .  Please call  Thanks

## 2015-11-22 NOTE — Telephone Encounter (Signed)
Pt calling back to fu on the message from yesterday regarding if his sleep study and Cpap supplies will be taken care of in 2017, worried all the charges will spill over into 2018-pls call [phone]

## 2015-11-23 ENCOUNTER — Ambulatory Visit (HOSPITAL_BASED_OUTPATIENT_CLINIC_OR_DEPARTMENT_OTHER): Payer: BC Managed Care – PPO | Attending: Cardiology | Admitting: Cardiology

## 2015-11-23 VITALS — Ht 70.0 in | Wt 255.0 lb

## 2015-11-23 DIAGNOSIS — G4733 Obstructive sleep apnea (adult) (pediatric): Secondary | ICD-10-CM

## 2015-11-28 ENCOUNTER — Other Ambulatory Visit (HOSPITAL_BASED_OUTPATIENT_CLINIC_OR_DEPARTMENT_OTHER): Payer: Self-pay

## 2015-11-28 DIAGNOSIS — G4733 Obstructive sleep apnea (adult) (pediatric): Secondary | ICD-10-CM

## 2015-11-28 NOTE — Telephone Encounter (Signed)
Called and spoke to the patient and he verbalized that he wants to get his CPAP and supplies before 2018 because of his insurance.

## 2015-12-05 ENCOUNTER — Other Ambulatory Visit: Payer: Self-pay | Admitting: *Deleted

## 2015-12-05 DIAGNOSIS — G4733 Obstructive sleep apnea (adult) (pediatric): Secondary | ICD-10-CM

## 2015-12-07 ENCOUNTER — Ambulatory Visit (HOSPITAL_BASED_OUTPATIENT_CLINIC_OR_DEPARTMENT_OTHER): Payer: BC Managed Care – PPO

## 2015-12-09 ENCOUNTER — Telehealth: Payer: Self-pay | Admitting: *Deleted

## 2015-12-09 NOTE — Telephone Encounter (Signed)
Spoke to the patient he verbalized understanding

## 2015-12-09 NOTE — Telephone Encounter (Signed)
-----  Message from Sueanne Margarita, MD sent at 12/06/2015  8:12 PM EST ----- Regarding: RE: CPAP Supplies Study has not been read yet  Benjamin [name] ----- Message ----- From: Freada Bergeron, CMA Sent: 12/05/2015   3:48 PM To: Sueanne Margarita, MD Subject: RE: CPAP Supplies                              The patient had an office visit on 11/02/15 and he already had a sleep study on 11/23/15 he says he just wants an updated CPAP machine. He is not currently with [facility].  Thanks, Gae Bon ----- Message ----- From: Sueanne Margarita, MD Sent: 12/04/2015   2:17 PM To: Freada Bergeron, CMA Subject: RE: CPAP Supplies                              He needs to be set up for a split night sleep study in the sleep lab  [name] ----- Message ----- From: Freada Bergeron, CMA Sent: 11/30/2015   2:40 PM To: Sueanne Margarita, MD Subject: CPAP Supplies                                  Dr Radford Pax this patients CPAP is old he got it in 2003 he used it until 2005 and stopped. He recently started back using it 4 months ago and his sleep has been good with the machine set on 7cm. He needs supplies and wants them before the end of the year as he has met his insurance deductible. Is he considered a new start. Thanks, Gae Bon ----- Message ----- From: Darlina Guys Sent: 11/28/2015   4:53 PM To: Freada Bergeron, CMA, Theodoro Parma, RN  Hello ladies. There is a "Patient Call" message in this pt's chart about a CPAP supply order.  We at [facility] are not able to respond to "Patient Call" messages. This patient is not currently with [facility] for CPAP or supplies.  I'm not sure if this order should go to a different DME or this is considered a new start.  If it's a new start, we'll need the whole CPAP order. Thanks! Melissa

## 2015-12-11 ENCOUNTER — Other Ambulatory Visit: Payer: Self-pay | Admitting: Family Medicine

## 2015-12-18 NOTE — Procedures (Signed)
Patient Name: Benjamin [name], Benjamin [name] Date: 11/23/2015 Gender: Male D.O.B: 01-28-1950 Age (years): 38 Referring Provider: Fransico Him MD, ABSM Height (inches): 70 Interpreting Physician: Fransico Him MD, ABSM Weight (lbs): 255 RPSGT: [place], Holly BMI: 37 MRN: [id number] Neck Size: 19.50  CLINICAL INFORMATION Sleep Study Type: Split Night CPAP  Indication for sleep study: OSA  Epworth Sleepiness Score:  SLEEP STUDY TECHNIQUE As per the AASM Manual for the Scoring of Sleep and Associated Events v2.3 (April 2016) with a hypopnea requiring 4% desaturations.  The channels recorded and monitored were frontal, central and occipital EEG, electrooculogram (EOG), submentalis EMG (chin), nasal and oral airflow, thoracic and abdominal wall motion, anterior tibialis EMG, snore microphone, electrocardiogram, and pulse oximetry. Continuous positive airway pressure (CPAP) was initiated when the patient met split night criteria and was titrated according to treat sleep-disordered breathing.  MEDICATIONS Medications self-administered by patient taken the night of the study : N/A  RESPIRATORY PARAMETERS Diagnostic Total AHI (/hr): 48.0  RDI (/hr):59.3   OA Index (/hr): 1.4  CA Index (/hr): 0.5 REM AHI (/hr): N/A  NREM AHI (/hr):48.0  Supine AHI (/hr):52.4  Non-supine AHI (/hr):37.19 Min O2 Sat (%):78.00  Mean O2 (%):92.85  Time below 88% (min):8.3   Titration Optimal Pressure (cm):16  AHI at Optimal Pressure (/hr):0.0  Min O2 at Optimal Pressure (%):92.0 Supine % at Optimal (%):100  Sleep % at Optimal (%):96    SLEEP ARCHITECTURE The recording time for the entire night was 388.3 minutes.  During a baseline period of 155.0 minutes, the patient slept for 127.5 minutes in REM and nonREM, yielding a sleep efficiency of 82.3%. Sleep onset after lights out was 10.8 minutes with a REM latency of N/A minutes. The patient spent 13.73% of the night in stage N1 sleep, 86.27% in stage N2  sleep, 0.00% in stage N3 and 0.00% in REM.  During the titration period of 227.8 minutes, the patient slept for 175.5 minutes in REM and nonREM, yielding a sleep efficiency of 77.0%. Sleep onset after CPAP initiation was 7.3 minutes with a REM latency of 33.0 minutes. The patient spent 8.26% of the night in stage N1 sleep, 62.68% in stage N2 sleep, 0.00% in stage N3 and 29.06% in REM.  CARDIAC DATA The 2 lead EKG demonstrated sinus rhythm. The mean heart rate was 51.81 beats per minute. Other EKG findings include: PVCs.  LEG MOVEMENT DATA The total Periodic Limb Movements of Sleep (PLMS) were 1. The PLMS index was 0.20 .  IMPRESSIONS - Severe obstructive sleep apnea occurred during the diagnostic portion of the study (AHI = 48.0/hour). An optimal PAP pressure was selected for this patient ( 16 cm of water) - No significant central sleep apnea occurred during the diagnostic portion of the study (CAI = 0.5/hour). - Oxygen desaturation was noted during the diagnostic portion of the study (Min O2 = 78.00%). - The patient snored with Moderate snoring volume during the diagnostic portion of the study. - EKG findings include PVCs. - Clinically significant periodic limb movements did not occur during sleep.  DIAGNOSIS - Obstructive Sleep Apnea (327.23 [G47.33 ICD-10]) - Nocturnal Hypoxemia  RECOMMENDATIONS - Trial of CPAP therapy on 16 cm H2O with a Medium size Resmed Nasal Mask Airfit N20 mask and heated humidification. - Avoid alcohol, sedatives and other CNS depressants that may worsen sleep apnea and disrupt normal sleep architecture. - Sleep hygiene should be reviewed to assess factors that may improve sleep quality. - Weight management and regular exercise should be initiated or  continued. - Return to Sleep Center for re-evaluation after 10 weeks of therapy  [place], [place] [facility]  ELECTRONICALLY SIGNED ON:  12/18/2015, 4:30 PM [place] PH: [phone]   FX: [phone] [place]

## 2015-12-23 ENCOUNTER — Encounter (HOSPITAL_BASED_OUTPATIENT_CLINIC_OR_DEPARTMENT_OTHER): Payer: BC Managed Care – PPO

## 2015-12-29 ENCOUNTER — Telehealth: Payer: Self-pay | Admitting: *Deleted

## 2016-02-17 ENCOUNTER — Encounter: Payer: Self-pay | Admitting: *Deleted

## 2016-02-29 ENCOUNTER — Encounter (INDEPENDENT_AMBULATORY_CARE_PROVIDER_SITE_OTHER): Payer: Self-pay

## 2016-02-29 ENCOUNTER — Ambulatory Visit (INDEPENDENT_AMBULATORY_CARE_PROVIDER_SITE_OTHER): Payer: BC Managed Care – PPO | Admitting: Cardiology

## 2016-02-29 VITALS — BP 108/80 | HR 70 | Ht 70.0 in | Wt 268.8 lb

## 2016-02-29 DIAGNOSIS — E669 Obesity, unspecified: Secondary | ICD-10-CM | POA: Diagnosis not present

## 2016-02-29 DIAGNOSIS — G4733 Obstructive sleep apnea (adult) (pediatric): Secondary | ICD-10-CM | POA: Diagnosis not present

## 2016-02-29 DIAGNOSIS — I10 Essential (primary) hypertension: Secondary | ICD-10-CM | POA: Diagnosis not present

## 2016-02-29 NOTE — Patient Instructions (Signed)

## 2016-02-29 NOTE — Progress Notes (Signed)
Cardiology Office Note    Date:  02/29/2016   ID:  Benjamin [name], DOB 1950/03/20, MRN [id number]  PCP:  Odette Fraction, MD  Cardiologist:  Carlyle Dolly, MD   Chief Complaint  Patient presents with  . Sleep Apnea  . Hypertension    History of Present Illness:  Benjamin [name] is a 66 y.o. male with a history of HTN, hyperlipidemia, CAD with MI and OSA on CPAP. He was diagnosed with OSA 10 years ago and was on CPAP for 6-8 months and stopped it due to discomfort with the mask.  He subsequently restarted the device and was seen by me.  I repeat a sleep study and he has severe OSA with an AHI of 48/hr with oxygen desaturations as low as 78%.  He is now on CPAP at 16cm H2O.  He is doing well with his device.  He uses a nasal mask which he is tolerating well.  He has no dry mouth or nasal dryness.  He has a  nasal mask that he likes.  He feels rested in the am and has no daytime sleepinss.  He says that his wife says that he does not snore with the CPAP.    Past Medical History:  Diagnosis Date  . Allergy   . ASCVD (arteriosclerotic cardiovascular disease)   . Hyperlipidemia   . Hypertension   . Myocardial infarction 04/20/1999  . Obesity (BMI 30-39.9)   . OSA (obstructive sleep apnea) 11/02/2015    Past Surgical History:  Procedure Laterality Date  . CARDIAC CATHETERIZATION N/A 05/19/2015   Procedure: Right/Left Heart Cath and Coronary Angiography;  Surgeon: Belva Crome, MD;  Location: [facility];  Service: Cardiovascular;  Laterality: N/A;  . CORONARY STENT PLACEMENT  04/18/1999    Current Medications: Current Meds  Medication Sig  . aspirin 81 MG tablet Take 81 mg by mouth daily. Reported on 05/05/2015  . atorvastatin (LIPITOR) 80 MG tablet TAKE ONE TABLET BY MOUTH DAILY AT 6PM  . carvedilol (COREG) 12.5 MG tablet Take 1 tablet (12.5 mg total) by mouth 2 (two) times daily.  . Cholecalciferol (VITAMIN D) 2000 UNITS CAPS Take 1 capsule by mouth daily.   .  sacubitril-valsartan (ENTRESTO) 49-51 MG Take 1 tablet by mouth 2 (two) times daily.    Allergies:   Penicillins   Social History   Social History  . Marital status: Married    Spouse name: N/A  . Number of children: N/A  . Years of education: N/A   Social History Main Topics  . Smoking status: Former Smoker    Packs/day: 0.50    Years: 8.00    Types: Cigarettes    Start date: 07/24/1966    Quit date: 07/24/1974  . Smokeless tobacco: Never Used  . Alcohol use No  . Drug use: No  . Sexual activity: Yes    Birth control/ protection: None   Other Topics Concern  . Not on file   Social History Narrative  . No narrative on file     Family History:  The patient's family history includes AAA (abdominal aortic aneurysm) in his father.   ROS:   Please see the history of present illness.    ROS All other systems reviewed and are negative.  No flowsheet data found.     PHYSICAL EXAM:   VS:  BP 108/80   Pulse 70   Ht 5\' 10"  (1.778 m)   Wt 268 lb 12.8 oz (121.9 kg)  BMI 38.57 kg/m    GEN: Well nourished, well developed, in no acute distress  HEENT: normal  Neck: no JVD, carotid bruits, or masses Cardiac: RRR; no murmurs, rubs, or gallops,no edema.  Intact distal pulses bilaterally.  Respiratory:  clear to auscultation bilaterally, normal work of breathing GI: soft, nontender, nondistended, + BS MS: no deformity or atrophy  Skin: warm and dry, no rash Neuro:  Alert and Oriented x 3, Strength and sensation are intact Psych: euthymic mood, full affect  Wt Readings from Last 3 Encounters:  02/29/16 268 lb 12.8 oz (121.9 kg)  11/23/15 255 lb (115.7 kg)  11/02/15 271 lb (122.9 kg)      Studies/Labs Reviewed:   EKG:  EKG is not ordered today.  Recent Labs: 05/16/2015: BUN 12; Creatinine, Ser 1.00; Hemoglobin 15.1; Platelets 249; Potassium 4.5; Sodium 138; TSH 1.715   Lipid Panel    Component Value Date/Time   CHOL 133 11/30/2014 0827   TRIG 113 11/30/2014  0827   HDL 43 11/30/2014 0827   CHOLHDL 3.1 11/30/2014 0827   VLDL 23 11/30/2014 0827   LDLCALC 67 11/30/2014 0827    Additional studies/ records that were reviewed today include:  CPA[facility] download    ASSESSMENT:    1. OSA (obstructive sleep apnea)   2. Benign essential HTN   3. Obesity (BMI 30-39.9)      PLAN:  In order of problems listed above:  OSA - the patient is tolerating PAP therapy well without any problems. The PAP download was reviewed today and showed an AHI of 3.7/hr on 16cm H2O CPAP with 100% compliance in using more than 4 hours nightly.  The patient has been using and benefiting from CPAP use and will continue to benefit from therapy. I am going to get a download from his DME. HTN - BP controlled on current meds.  He will continue on BB and Entresto. 3.   Obesity - I have encouraged him to get into a routine exercise program and cut back on carbs and portions.     Medication Adjustments/Labs and Tests Ordered: Current medicines are reviewed at length with the patient today.  Concerns regarding medicines are outlined above.  Medication changes, Labs and Tests ordered today are listed in the Patient Instructions below.  There are no Patient Instructions on file for this visit.   Signed, Fransico Him, MD  02/29/2016 2:18 PM    [facility] [place], [place], [place]  [address] Phone: [phone]; Fax: [phone]

## 2016-03-01 ENCOUNTER — Telehealth: Payer: Self-pay | Admitting: *Deleted

## 2016-03-01 NOTE — Telephone Encounter (Signed)
-----   Message from Theodoro Parma, RN sent at 03/01/2016  7:49 AM EST ----- Regarding: [facility] download Please request a download from [employer]!

## 2016-03-01 NOTE — Telephone Encounter (Signed)
Talked to [place] at [facility] and requested the download.

## 2016-03-07 ENCOUNTER — Telehealth: Payer: Self-pay | Admitting: Cardiology

## 2016-03-07 ENCOUNTER — Telehealth: Payer: Self-pay | Admitting: *Deleted

## 2016-03-07 DIAGNOSIS — G4733 Obstructive sleep apnea (adult) (pediatric): Secondary | ICD-10-CM

## 2016-03-07 NOTE — Telephone Encounter (Signed)
New message     Pt wife states that Dr Radford Pax was to call and have her husbands CPAP adjusted and it has not been done yet

## 2016-03-07 NOTE — Telephone Encounter (Signed)
Called the patient and was informed that he wanted his cpap adjusted from 7cm to 16cm. On 02/29/16 the notes states his on already on 16cm H20. The patient says he knows he's not on 16cm and would like an order for 16cm H20 sent to [facility]

## 2016-03-07 NOTE — Telephone Encounter (Signed)
Called and spoke to the patient about his DME and found out he doesn't have one. He needs supplies but his sleep study is more than 6 months old. He is on medicare and they require a sleep study to be within 6 months for a in lab study. He refuses an in lab study but is willing to do the at home sleep study. I will set this up with the DME of his choice

## 2016-03-07 NOTE — Telephone Encounter (Signed)
Please check with Benjamin [name] - I think she did this

## 2016-03-08 ENCOUNTER — Other Ambulatory Visit: Payer: Self-pay | Admitting: *Deleted

## 2016-03-08 NOTE — Addendum Note (Signed)
Addended by: Freada Bergeron on: 03/08/2016 02:09 PM   Modules accepted: Orders

## 2016-03-08 NOTE — Telephone Encounter (Signed)
Called [facility] and spoke to [place] told him the patient wanted a pressure change to 16cm H20 and there was an order already in epic so I faxed it over to [facility] and they will make the change for the patient. I will notify the patient

## 2016-04-07 ENCOUNTER — Other Ambulatory Visit: Payer: Self-pay | Admitting: Family Medicine

## 2016-04-09 NOTE — Telephone Encounter (Signed)
Patient needs to be seen before any further refills.  Has been 2016 since routine visit and fasting blood work.

## 2016-04-25 ENCOUNTER — Ambulatory Visit: Payer: BC Managed Care – PPO | Admitting: Cardiology

## 2016-04-25 NOTE — Progress Notes (Deleted)
Clinical Summary Benjamin [name] is a 66 y.o.male seen today for follow up of the following medical problems.  1. CAD - previous notes mention MI in 2001 at [facility], cannot find cath report. Patient has stent card that shows he received a stent to his LAD at that time.  - in setting of newly diagnosed LV systolic dysfunction recently, he had repeat cath 05/2015 that showed patent coronaries.   - no recent chest pain since last visit   2. Chronic systolic heart failure/NICM - echo showed an LVEF 25-30%, this is a new finding of systolic dysfunction. - 05/2015 cath showed patent coronaries. Normal filling pressures at that time.   - since last visit we repeated an echo 9/217, LVEF improved to 45-50%. Grade I diasotlic dysfunction. - no new symptoms - compliant with meds  3. OSA screen - mixed compliance with CPAP, mainly due to discomfort. His machine is several years old - has appointment coming up with Dr Radford Pax in the next few weeks.    Past Medical History:  Diagnosis Date  . Allergy   . ASCVD (arteriosclerotic cardiovascular disease)   . Hyperlipidemia   . Hypertension   . Myocardial infarction 04/20/1999  . Obesity (BMI 30-39.9)   . OSA (obstructive sleep apnea) 11/02/2015     Allergies  Allergen Reactions  . Penicillins Swelling     Current Outpatient Prescriptions  Medication Sig Dispense Refill  . aspirin 81 MG tablet Take 81 mg by mouth daily. Reported on 05/05/2015    . atorvastatin (LIPITOR) 80 MG tablet TAKE ONE TABLET BY MOUTH DAILY AT 6PM 90 tablet 0  . carvedilol (COREG) 12.5 MG tablet Take 1 tablet (12.5 mg total) by mouth 2 (two) times daily. 180 tablet 3  . Cholecalciferol (VITAMIN D) 2000 UNITS CAPS Take 1 capsule by mouth daily.     . sacubitril-valsartan (ENTRESTO) 49-51 MG Take 1 tablet by mouth 2 (two) times daily. 180 tablet 3   No current facility-administered medications for this visit.      Past Surgical History:  Procedure  Laterality Date  . CARDIAC CATHETERIZATION N/A 05/19/2015   Procedure: Right/Left Heart Cath and Coronary Angiography;  Surgeon: Belva Crome, MD;  Location: [facility];  Service: Cardiovascular;  Laterality: N/A;  . CORONARY STENT PLACEMENT  04/18/1999     Allergies  Allergen Reactions  . Penicillins Swelling      Family History  Problem Relation Age of Onset  . AAA (abdominal aortic aneurysm) Father      Social History Benjamin [name] reports that he quit smoking about 41 years ago. His smoking use included Cigarettes. He started smoking about 49 years ago. He has a 4.00 pack-year smoking history. He has never used smokeless tobacco. Benjamin [name] reports that he does not drink alcohol.   Review of Systems CONSTITUTIONAL: No weight loss, fever, chills, weakness or fatigue.  HEENT: Eyes: No visual loss, blurred vision, double vision or yellow sclerae.No hearing loss, sneezing, congestion, runny nose or sore throat.  SKIN: No rash or itching.  CARDIOVASCULAR:  RESPIRATORY: No shortness of breath, cough or sputum.  GASTROINTESTINAL: No anorexia, nausea, vomiting or diarrhea. No abdominal pain or blood.  GENITOURINARY: No burning on urination, no polyuria NEUROLOGICAL: No headache, dizziness, syncope, paralysis, ataxia, numbness or tingling in the extremities. No change in bowel or bladder control.  MUSCULOSKELETAL: No muscle, back pain, joint pain or stiffness.  LYMPHATICS: No enlarged nodes. No history of splenectomy.  PSYCHIATRIC: No  history of depression or anxiety.  ENDOCRINOLOGIC: No reports of sweating, cold or heat intolerance. No polyuria or polydipsia.  Marland Kitchen   Physical Examination There were no vitals filed for this visit. There were no vitals filed for this visit.  Gen: resting comfortably, no acute distress HEENT: no scleral icterus, pupils equal round and reactive, no palptable cervical adenopathy,  CV Resp: Clear to auscultation bilaterally GI: abdomen is  soft, non-tender, non-distended, normal bowel sounds, no hepatosplenomegaly MSK: extremities are warm, no edema.  Skin: warm, no rash Neuro:  no focal deficits Psych: appropriate affect   Diagnostic Studies 05/2015 Cath 1. Prox RCA to Mid RCA lesion, 20% stenosed. 2. Ost LAD to Prox LAD lesion, 30% stenosed. The lesion was previously treated with a stent (unknown type). 3. Dist Cx lesion, 50% stenosed.   Severe anterior apical hypokinesis. Estimated ejection fraction 35%. Normal left ventricular hemodynamics with EDP of 14 mmHg and mean capillary wedge 17 mmHg.  Widely patent coronary arteries with 30% diffuse ISR in the proximal LAD stent. There is 50% eccentric stenosis in the distal circumflex. Luminal irregularities are noted in the right coronary.  RECOMMENDATIONS:   Aggressive risk factor modification to prevent progression of CAD  Aggressive heart failure therapy to minimize progression of LV dysfunction.   05/2015 Echo Study Conclusions  - Left ventricle: The cavity size was normal. Wall thickness was  normal. Systolic function was severely reduced. The estimated  ejection fraction was in the range of 25% to 30%. Diffuse  hypokinesis. Doppler parameters are consistent with abnormal left  ventricular relaxation (grade 1 diastolic dysfunction). - Regional wall motion abnormality: Akinesis of the mid anterior,  basal-mid anteroseptal, and apical myocardium. - Aortic valve: Mildly calcified annulus. Trileaflet; mildly  thickened leaflets. Valve area (VTI): 2.42 cm^2. Valve area  (Vmax): 2.32 cm^2. - Atrial septum: No defect or patent foramen ovale was identified. - Technically difficult study. Echocontrast was used to enhance  visualization.    09/2015 echo Study Conclusions  - Left ventricle: The cavity size was normal. Wall thickness was   normal. Systolic function was mildly reduced. The estimated   ejection fraction was in the range of 45% to  50%. Doppler   parameters are consistent with abnormal left ventricular   relaxation (grade 1 diastolic dysfunction). - Regional wall motion abnormality: Hypokinesis of the mid   anterior, mid anteroseptal, and apical myocardium. - Aortic valve: Mildly calcified annulus. Trileaflet; mildly   thickened leaflets. Valve area (VTI): 3.07 cm^2. Valve area   (Vmax): 2.7 cm^2. Valve area (Vmean): 2.7 cm^2. - Technically difficult study.   Assessment and Plan  1. CAD - history of stent to LAD in 2001 - repeat cath in setting of newly diagnosed systolic heart failure shows patent coronaries - no recentsymptoms.  - continue current meds  2. Chronic systolic heart failure/NICM - recent echo shows new diagnosis of sysotlic dysfunciton, LVEF 25-30%. He is NYHA I-II.  - repeat echo shows LVEF has improved and nearly normalized - no recent symptoms. Contiue current meds   3. OSA -has appt coming up with Dr Heron Nay.   F/u 6 months       Arnoldo Lenis, M.D., F.A.C.C.

## 2016-04-27 ENCOUNTER — Telehealth: Payer: Self-pay | Admitting: Family Medicine

## 2016-04-27 ENCOUNTER — Ambulatory Visit (INDEPENDENT_AMBULATORY_CARE_PROVIDER_SITE_OTHER): Payer: BC Managed Care – PPO | Admitting: Family Medicine

## 2016-04-27 DIAGNOSIS — I10 Essential (primary) hypertension: Secondary | ICD-10-CM

## 2016-04-27 DIAGNOSIS — E7800 Pure hypercholesterolemia, unspecified: Secondary | ICD-10-CM | POA: Diagnosis not present

## 2016-04-27 DIAGNOSIS — Z125 Encounter for screening for malignant neoplasm of prostate: Secondary | ICD-10-CM | POA: Diagnosis not present

## 2016-04-27 DIAGNOSIS — I5022 Chronic systolic (congestive) heart failure: Secondary | ICD-10-CM | POA: Diagnosis not present

## 2016-04-27 DIAGNOSIS — R7303 Prediabetes: Secondary | ICD-10-CM | POA: Diagnosis not present

## 2016-04-27 DIAGNOSIS — I2510 Atherosclerotic heart disease of native coronary artery without angina pectoris: Secondary | ICD-10-CM

## 2016-04-27 LAB — CBC WITH DIFFERENTIAL/PLATELET
BASOS ABS: 0 {cells}/uL (ref 0–200)
Basophils Relative: 0 %
EOS ABS: 237 {cells}/uL (ref 15–500)
EOS PCT: 3 %
HCT: 46.6 % (ref 38.5–50.0)
HEMOGLOBIN: 15.5 g/dL (ref 13.0–17.0)
Lymphocytes Relative: 41 %
Lymphs Abs: 3239 cells/uL (ref 850–3900)
MCH: 28.5 pg (ref 27.0–33.0)
MCHC: 33.3 g/dL (ref 32.0–36.0)
MCV: 85.7 fL (ref 80.0–100.0)
MONOS PCT: 6 %
MPV: 9.6 fL (ref 7.5–12.5)
Monocytes Absolute: 474 cells/uL (ref 200–950)
NEUTROS ABS: 3950 {cells}/uL (ref 1500–7800)
Neutrophils Relative %: 50 %
PLATELETS: 227 10*3/uL (ref 140–400)
RBC: 5.44 MIL/uL (ref 4.20–5.80)
RDW: 14.4 % (ref 11.0–15.0)
WBC: 7.9 10*3/uL (ref 3.8–10.8)

## 2016-04-27 LAB — COMPLETE METABOLIC PANEL WITH GFR
AG RATIO: 1.8 ratio (ref 1.0–2.5)
ALBUMIN: 4.1 g/dL (ref 3.6–5.1)
ALK PHOS: 88 U/L (ref 40–115)
ALT: 19 U/L (ref 9–46)
AST: 18 U/L (ref 10–35)
BILIRUBIN TOTAL: 0.6 mg/dL (ref 0.2–1.2)
BUN/Creatinine Ratio: 13.5 Ratio (ref 6–22)
BUN: 15 mg/dL (ref 7–25)
CALCIUM: 8.9 mg/dL (ref 8.6–10.3)
CO2: 22 mmol/L (ref 20–31)
Chloride: 107 mmol/L (ref 98–110)
Creat: 1.11 mg/dL (ref 0.70–1.25)
GFR, EST NON AFRICAN AMERICAN: 69 mL/min (ref >=60)
GFR, Est African American: 80 mL/min (ref >=60)
GLOBULIN: 2.3 g/dL (ref 1.9–3.7)
GLUCOSE: 108 mg/dL — AB (ref 70–99)
Potassium: 5.1 mmol/L (ref 3.5–5.3)
Sodium: 142 mmol/L (ref 135–146)
TOTAL PROTEIN: 6.4 g/dL (ref 6.1–8.1)

## 2016-04-27 LAB — LIPID PANEL
Cholesterol: 156 mg/dL (ref <200)
HDL: 52 mg/dL (ref >40)
LDL Cholesterol: 75 mg/dL (ref <100)
TRIGLYCERIDES: 147 mg/dL (ref <150)
Total CHOL/HDL Ratio: 3 Ratio (ref <5.0)
VLDL: 29 mg/dL (ref <30)

## 2016-04-27 MED ORDER — ATORVASTATIN CALCIUM 80 MG PO TABS: ORAL_TABLET | ORAL | 1 refills | Status: DC

## 2016-04-27 NOTE — Telephone Encounter (Signed)
Medication called/sent to requested pharmacy  

## 2016-04-27 NOTE — Telephone Encounter (Signed)
Patient calling to get refill his lipitor med  [phone]

## 2016-04-27 NOTE — Progress Notes (Signed)
Subjective:    Patient ID: Benjamin [name], male    DOB: 24-Oct-1950, 66 y.o.   MRN: [id number]  HPI Patient is a very pleasant 66 year old male here today for follow-up of his vascular disease, congestive heart failure, and prediabetes. Since the last time I saw him, the patient had an echocardiogram that revealed an ejection fraction of 25-30%. He was started on the appropriate medications by his cardiologist he also resumed his CPAP. His most recent echocardiogram was performed in September 2017 revealed an ejection fraction of 45-50%. He is feeling better. However the last time I saw the patient, he was found to be a borderline diabetic with a hemoglobin A1c approaching 6.5. He is eating a high carbohydrate diet. He is not exercising. He is obese. He denies any polyuria, polydipsia, or blurry vision. He denies any chest pain shortness of breath or dyspnea on exertion. He denies any myalgias or right upper quadrant pain on his statin medication.   Past Medical History:  Diagnosis Date  . Allergy   . ASCVD (arteriosclerotic cardiovascular disease)   . Hyperlipidemia   . Hypertension   . Myocardial infarction 04/20/1999  . Obesity (BMI 30-39.9)   . OSA (obstructive sleep apnea) 11/02/2015   Past Surgical History:  Procedure Laterality Date  . CARDIAC CATHETERIZATION N/A 05/19/2015   Procedure: Right/Left Heart Cath and Coronary Angiography;  Surgeon: Belva Crome, MD;  Location: [facility];  Service: Cardiovascular;  Laterality: N/A;  . CORONARY STENT PLACEMENT  04/18/1999   Current Outpatient Prescriptions on File Prior to Visit  Medication Sig Dispense Refill  . aspirin 81 MG tablet Take 81 mg by mouth daily. Reported on 05/05/2015    . atorvastatin (LIPITOR) 80 MG tablet TAKE ONE TABLET BY MOUTH DAILY AT 6PM 90 tablet 0  . carvedilol (COREG) 12.5 MG tablet Take 1 tablet (12.5 mg total) by mouth 2 (two) times daily. 180 tablet 3  . Cholecalciferol (VITAMIN D) 2000 UNITS CAPS Take 1  capsule by mouth daily.     . sacubitril-valsartan (ENTRESTO) 49-51 MG Take 1 tablet by mouth 2 (two) times daily. 180 tablet 3   No current facility-administered medications on file prior to visit.    Allergies  Allergen Reactions  . Penicillins Swelling   Social History   Social History  . Marital status: Married    Spouse name: N/A  . Number of children: N/A  . Years of education: N/A   Occupational History  . Not on file.   Social History Main Topics  . Smoking status: Former Smoker    Packs/day: 0.50    Years: 8.00    Types: Cigarettes    Start date: 07/24/1966    Quit date: 07/24/1974  . Smokeless tobacco: Never Used  . Alcohol use No  . Drug use: No  . Sexual activity: Yes    Birth control/ protection: None   Other Topics Concern  . Not on file   Social History Narrative  . No narrative on file     Review of Systems  All other systems reviewed and are negative.      Objective:   Physical Exam  Constitutional: He appears well-developed and well-nourished.  HENT:  Right Ear: Tympanic membrane and ear canal normal.  Left Ear: Tympanic membrane and ear canal normal.  Nose: No mucosal edema or rhinorrhea. Right sinus exhibits no maxillary sinus tenderness and no frontal sinus tenderness. Left sinus exhibits no maxillary sinus tenderness and no frontal sinus tenderness.  Mouth/Throat: Oropharynx is clear and moist. No oropharyngeal exudate.  Eyes: Conjunctivae are normal. Pupils are equal, round, and reactive to light.  Neck: Neck supple.  Cardiovascular: Normal rate, regular rhythm and normal heart sounds.   No murmur heard. Pulmonary/Chest: Effort normal and breath sounds normal. No respiratory distress. He has no wheezes. He has no rales.  Lymphadenopathy:    He has no cervical adenopathy.  Vitals reviewed.         Assessment & Plan:  ASCVD (arteriosclerotic cardiovascular disease) - Plan: COMPLETE METABOLIC PANEL WITH GFR, CBC with  Differential/Platelet, Lipid panel  Chronic systolic heart failure (HCC)  Benign essential HTN  Pure hypercholesterolemia - Plan: COMPLETE METABOLIC PANEL WITH GFR, CBC with Differential/Platelet, Lipid panel  Prediabetes - Plan: Hemoglobin A1c  Prostate cancer screening - Plan: PSA  I will check a CMP and a fasting lipid panel. Goal LDL cholesterol is less than 70. I will repeat a hemoglobin A1c. Goal hemoglobin A1c is less than 6.5. His blood pressure today is at goal. He is on the appropriate medications for congestive heart failure including a beta blocker, an angiotensin receptor blocker, and entresto.  While checking blood work, I will screen the patient for prostate cancer. Spent 10-15 minutes discussing a low carbohydrate diet and increasing aerobic exercise. Recommended 10-20 pounds weight loss

## 2016-04-28 LAB — HEMOGLOBIN A1C
Hgb A1c MFr Bld: 6.1 % — ABNORMAL HIGH (ref <5.7)
Mean Plasma Glucose: 128 mg/dL

## 2016-04-28 LAB — PSA: PSA: 0.7 ng/mL (ref <=4.0)

## 2016-04-30 ENCOUNTER — Encounter: Payer: Self-pay | Admitting: Family Medicine

## 2016-07-26 ENCOUNTER — Ambulatory Visit: Payer: BC Managed Care – PPO | Admitting: Cardiology

## 2016-08-08 ENCOUNTER — Other Ambulatory Visit: Payer: Self-pay | Admitting: Cardiology

## 2016-09-12 ENCOUNTER — Encounter: Payer: Self-pay | Admitting: Cardiology

## 2016-09-12 ENCOUNTER — Ambulatory Visit (INDEPENDENT_AMBULATORY_CARE_PROVIDER_SITE_OTHER): Payer: BC Managed Care – PPO | Admitting: Cardiology

## 2016-09-12 VITALS — BP 100/60 | HR 58 | Ht 70.0 in | Wt 277.0 lb

## 2016-09-12 DIAGNOSIS — I2510 Atherosclerotic heart disease of native coronary artery without angina pectoris: Secondary | ICD-10-CM | POA: Diagnosis not present

## 2016-09-12 DIAGNOSIS — E782 Mixed hyperlipidemia: Secondary | ICD-10-CM

## 2016-09-12 DIAGNOSIS — I5022 Chronic systolic (congestive) heart failure: Secondary | ICD-10-CM

## 2016-09-12 NOTE — Patient Instructions (Signed)

## 2016-09-12 NOTE — Progress Notes (Signed)
Clinical Summary Mr. Neisen is a 66 y.o.male seen today for follow up of the following medical problems.  1. CAD - previous notes mention MI in 2001 at [facility], cannot find cath report. Patient has stent card that shows he received a stent to his LAD at that time.  - in setting of newly diagnosed LV systolic dysfunction recently, he had repeat cath 05/2015 that showed patent coronaries.   - no recent chest pain/SOB/DOE - compliant with meds   2. Chronic systolic heart failure/NICM - echo showed an LVEF 25-30%, this is a new finding of systolic dysfunction. - 05/2015 cath showed patent coronaries. Normal filling pressures at that time.  - repeate echo 9/217, LVEF improved to 45-50%. Grade I diasotlic dysfunction.   - denies any recent SOB/DOE/LE edema  3. OSA  - followed by Dr Radford Pax  4. HL - 04/2016 TC 156 TG 147 HDL 52 LDL 75 - compliant with statin   CH:ENIDP as custodian Past Medical History:  Diagnosis Date  . Allergy   . ASCVD (arteriosclerotic cardiovascular disease)   . Hyperlipidemia   . Hypertension   . Myocardial infarction 04/20/1999  . Obesity (BMI 30-39.9)   . OSA (obstructive sleep apnea) 11/02/2015     Allergies  Allergen Reactions  . Penicillins Swelling     Current Outpatient Prescriptions  Medication Sig Dispense Refill  . aspirin 81 MG tablet Take 81 mg by mouth daily. Reported on 05/05/2015    . atorvastatin (LIPITOR) 80 MG tablet TAKE ONE TABLET BY MOUTH DAILY AT 6PM 90 tablet 1  . carvedilol (COREG) 12.5 MG tablet TAKE ONE TABLET BY MOUTH TWO TIMES A DAY 180 tablet 1  . Cholecalciferol (VITAMIN D) 2000 UNITS CAPS Take 1 capsule by mouth daily.     . sacubitril-valsartan (ENTRESTO) 49-51 MG Take 1 tablet by mouth 2 (two) times daily. 180 tablet 3   No current facility-administered medications for this visit.      Past Surgical History:  Procedure Laterality Date  . CARDIAC CATHETERIZATION N/A 05/19/2015   Procedure:  Right/Left Heart Cath and Coronary Angiography;  Surgeon: Belva Crome, MD;  Location: [place] [facility];  Service: Cardiovascular;  Laterality: N/A;  . CORONARY STENT PLACEMENT  04/18/1999     Allergies  Allergen Reactions  . Penicillins Swelling      Family History  Problem Relation Age of Onset  . AAA (abdominal aortic aneurysm) Father      Social History Mr. Schwertner reports that he quit smoking about 42 years ago. His smoking use included Cigarettes. He started smoking about 50 years ago. He has a 4.00 pack-year smoking history. He has never used smokeless tobacco. Mr. Wanat reports that he does not drink alcohol.   Review of Systems CONSTITUTIONAL: No weight loss, fever, chills, weakness or fatigue.  HEENT: Eyes: No visual loss, blurred vision, double vision or yellow sclerae.No hearing loss, sneezing, congestion, runny nose or sore throat.  SKIN: No rash or itching.  CARDIOVASCULAR: per hpi RESPIRATORY: per hpi GASTROINTESTINAL: No anorexia, nausea, vomiting or diarrhea. No abdominal pain or blood.  GENITOURINARY: No burning on urination, no polyuria NEUROLOGICAL: No headache, dizziness, syncope, paralysis, ataxia, numbness or tingling in the extremities. No change in bowel or bladder control.  MUSCULOSKELETAL: No muscle, back pain, joint pain or stiffness.  LYMPHATICS: No enlarged nodes. No history of splenectomy.  PSYCHIATRIC: No history of depression or anxiety.  ENDOCRINOLOGIC: No reports of sweating, cold or heat intolerance. No polyuria  or polydipsia.  Marland Kitchen   Physical Examination Vitals:   09/12/16 1502  BP: 100/60  Pulse: (!) 58  SpO2: 95%   Vitals:   09/12/16 1502  Weight: 277 lb (125.6 kg)  Height: 5\' 10"  (1.778 m)    Gen: resting comfortably, no acute distress HEENT: no scleral icterus, pupils equal round and reactive, no palptable cervical adenopathy,  CV: RRR, no m/r/g, no jvd Resp: Clear to auscultation bilaterally GI: abdomen is soft,  non-tender, non-distended, normal bowel sounds, no hepatosplenomegaly MSK: extremities are warm, no edema.  Skin: warm, no rash Neuro:  no focal deficits Psych: appropriate affect   Assessment and Plan   1. CAD - history of stent to LAD in 2001 - repeat cath in setting of newly diagnosed systolic heart failure shows patent coronaries - he denies any recent chest pain, we will continue current meds  2. Chronic systolic heart failure/NICM - LVEF improved with medical therapy. No current symptoms, continue current meds   3. OSA - f/u with Dr Radford Pax  4. Hyperlipidemia - LDL at goal, continue statin   F/u 1 year  Arnoldo Lenis, M.D.

## 2016-09-14 ENCOUNTER — Ambulatory Visit: Payer: BC Managed Care – PPO | Admitting: Cardiology

## 2016-09-20 ENCOUNTER — Other Ambulatory Visit: Payer: Self-pay | Admitting: *Deleted

## 2016-09-20 MED ORDER — SACUBITRIL-VALSARTAN 49-51 MG PO TABS: 1.0000 | ORAL_TABLET | Freq: Two times a day (BID) | ORAL | 1 refills | Status: DC

## 2016-09-27 ENCOUNTER — Encounter: Payer: Self-pay | Admitting: Cardiology

## 2016-11-03 ENCOUNTER — Other Ambulatory Visit: Payer: Self-pay | Admitting: Family Medicine

## 2016-11-26 IMAGING — DX DG CHEST 2V
2 series · 2 of 2 positions shown · non-contrast
Comparison: None in PACs

CLINICAL DATA: Preoperative examination prior to cardiac
catheterization; history of previous MI, former smoker.

EXAM:
CHEST  2 VIEW

[chest pa]
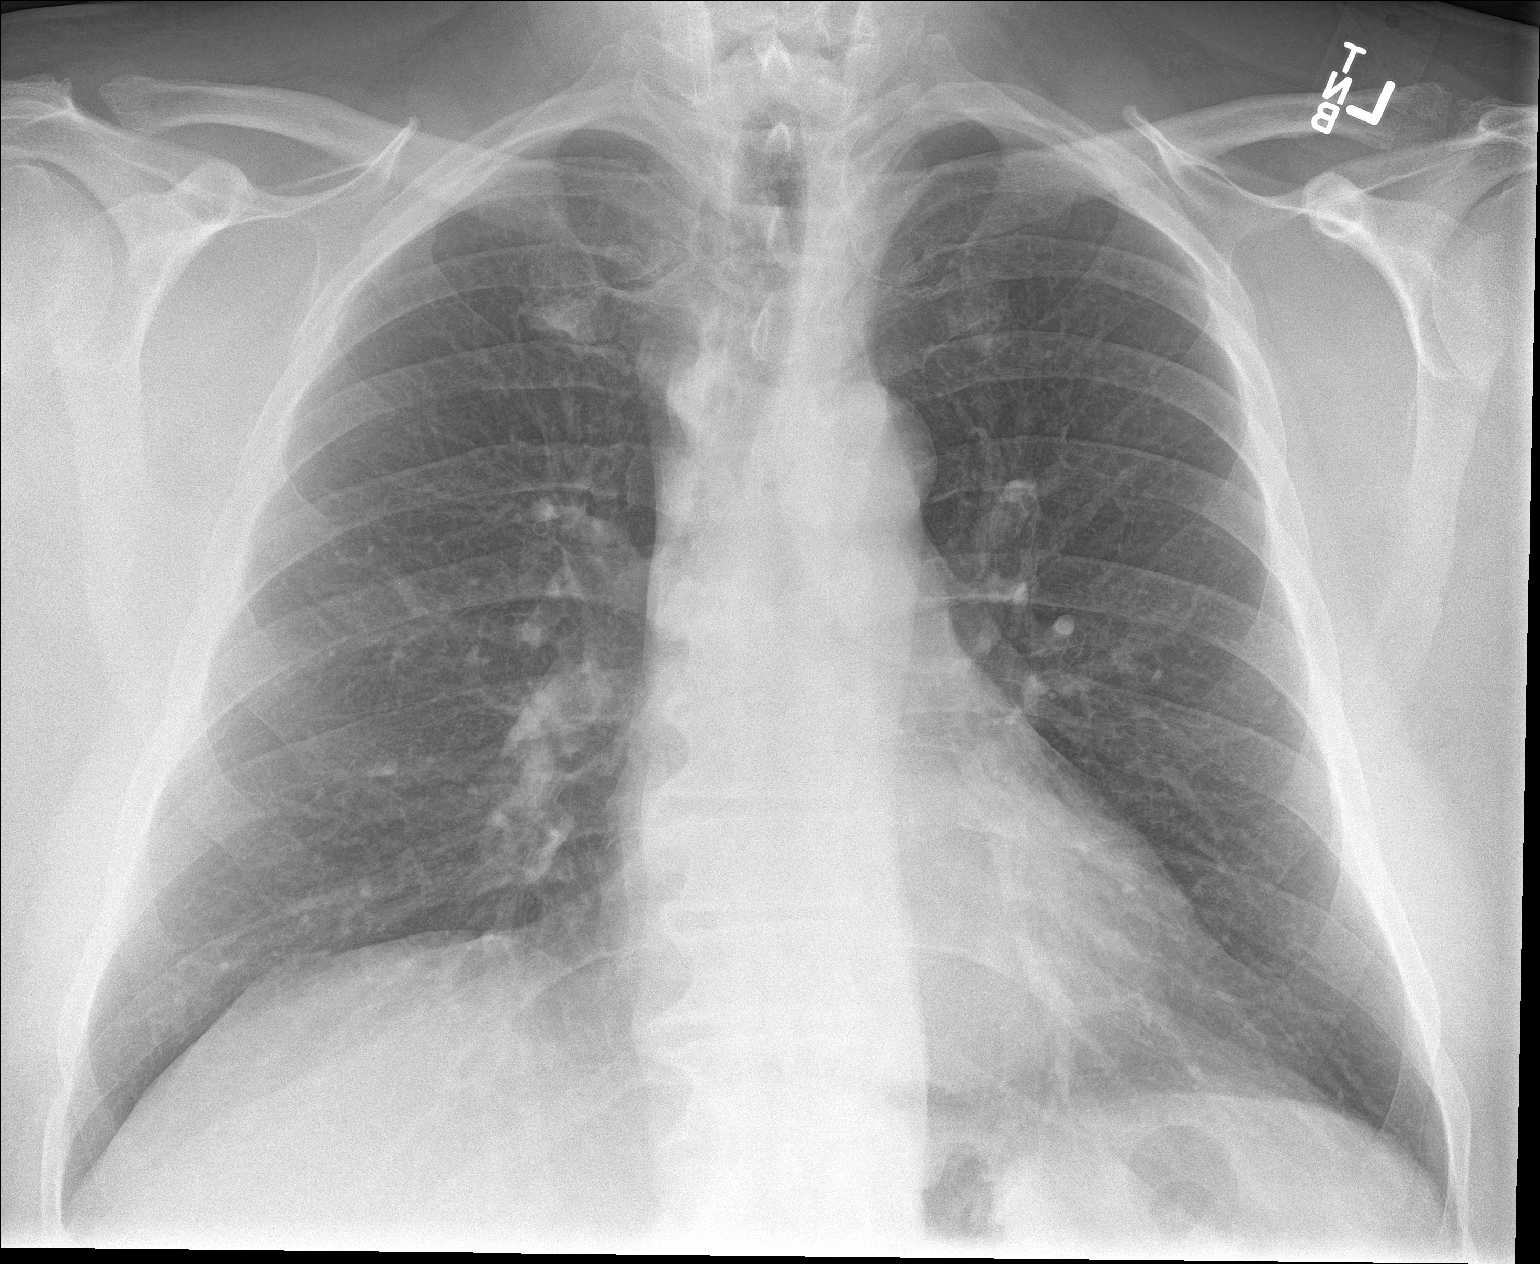

[chest lat]
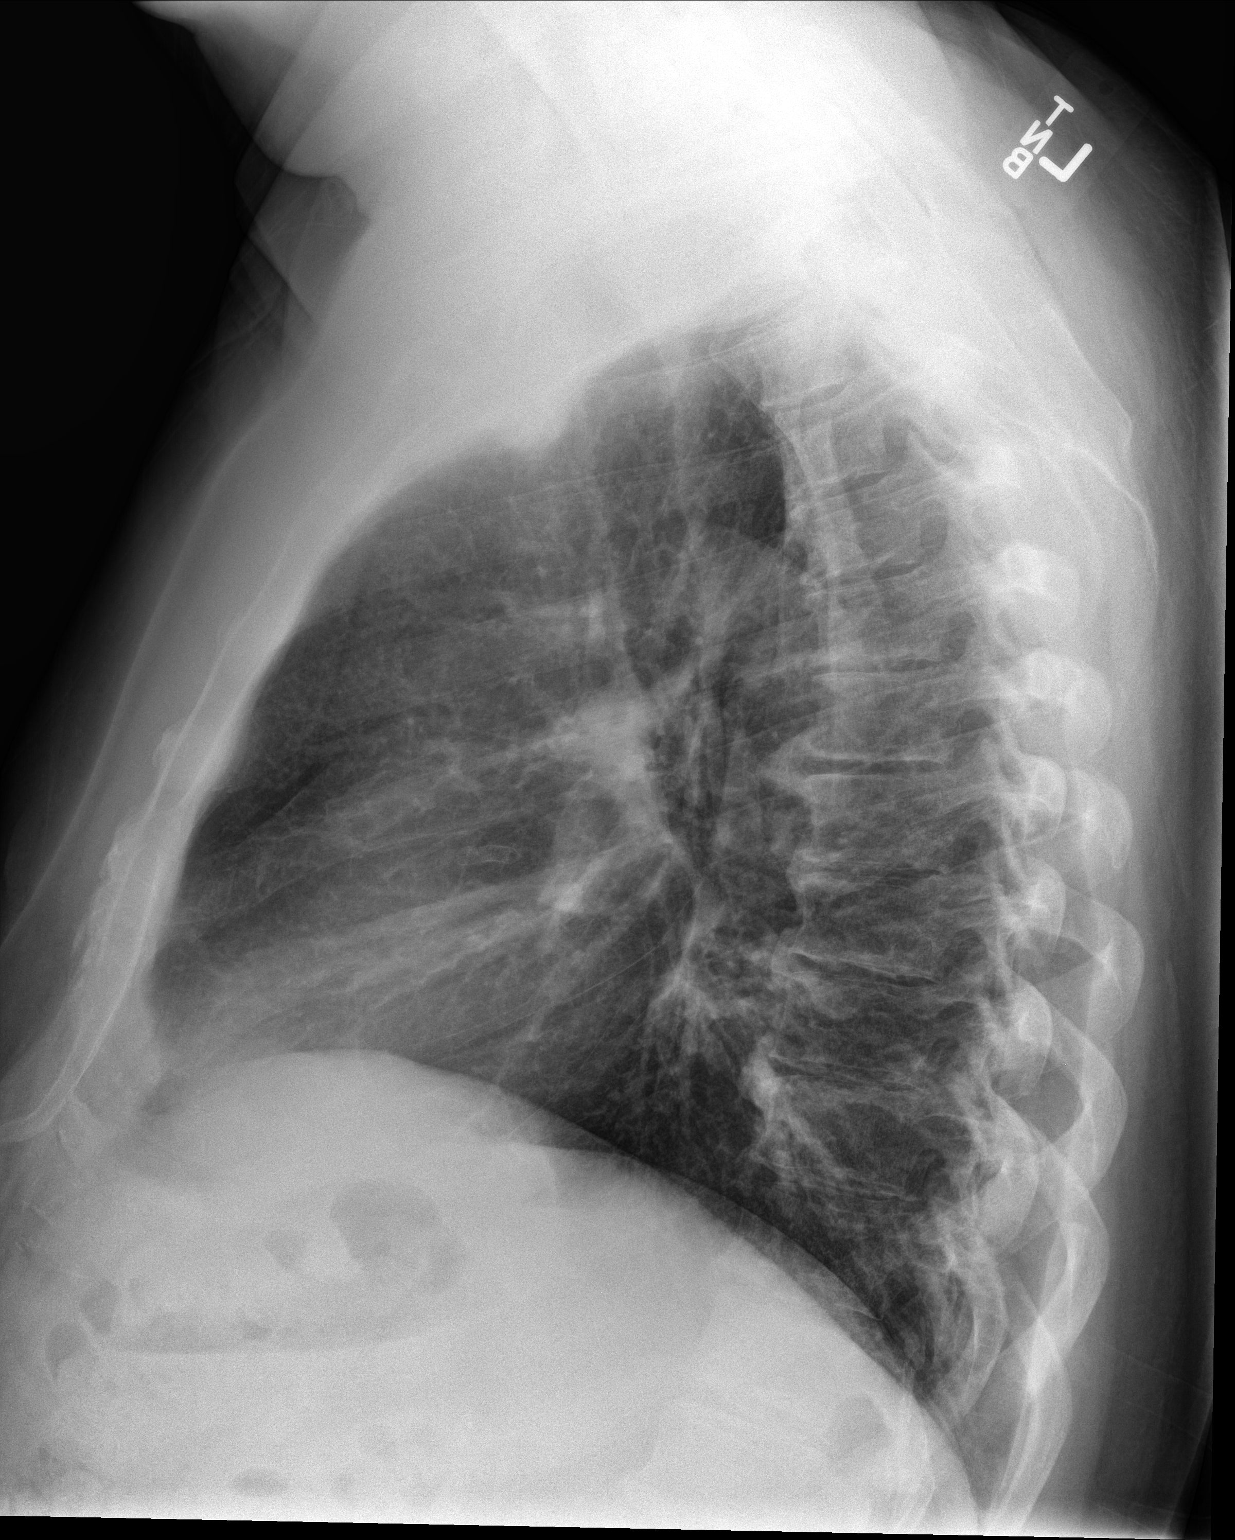

[2 of 2 positions shown; findings below may reference images not displayed]

FINDINGS: The lungs are adequately inflated. The interstitial markings are
coarse bilaterally. There is no alveolar infiltrate. There is no
pleural effusion. The heart is normal in size. The pulmonary
vascularity is not engorged. The mediastinum is normal in width.
There is mild multilevel degenerative disc disease of the thoracic
spine.
IMPRESSION: Mild chronic bronchitic changes. There is no pneumonia, CHF, nor
other acute cardiopulmonary abnormality.

## 2017-02-04 ENCOUNTER — Other Ambulatory Visit: Payer: Self-pay | Admitting: Family Medicine

## 2017-02-20 ENCOUNTER — Other Ambulatory Visit: Payer: Self-pay | Admitting: Cardiology

## 2017-03-11 ENCOUNTER — Encounter: Payer: Self-pay | Admitting: Family Medicine

## 2017-03-11 ENCOUNTER — Ambulatory Visit (INDEPENDENT_AMBULATORY_CARE_PROVIDER_SITE_OTHER): Payer: BC Managed Care – PPO | Admitting: Family Medicine

## 2017-03-11 VITALS — BP 124/70 | HR 50 | Temp 97.9°F | Resp 18 | Ht 70.0 in | Wt 272.0 lb

## 2017-03-11 DIAGNOSIS — M5432 Sciatica, left side: Secondary | ICD-10-CM | POA: Diagnosis not present

## 2017-03-11 MED ORDER — OXYCODONE-ACETAMINOPHEN 7.5-325 MG PO TABS: 1.0000 | ORAL_TABLET | ORAL | 0 refills | Status: DC | PRN

## 2017-03-11 MED ORDER — PREDNISONE 20 MG PO TABS
ORAL_TABLET | ORAL | 0 refills | Status: DC
Start: 2017-03-11 — End: 2017-03-19

## 2017-03-11 NOTE — Progress Notes (Signed)
Subjective:    Patient ID: Benjamin [name], male    DOB: Aug 10, 1950, 67 y.o.   MRN: [id number]  HPI Patient presents with a one-week history of pain starting in the center of his lower back, radiating into his posterior left gluteus and left hip area, down the posterior left thigh wrapping around his knee to the lateral left calf and shin area and into his foot. He also complains of numbness and tingling and pins and needles in his left foot. He also complains of throbbing aching pain shooting down his leg. He denies any specific fall or injury. He denies any fevers or chills. He denies any rash. Past Medical History:  Diagnosis Date  . Allergy   . ASCVD (arteriosclerotic cardiovascular disease)   . Hyperlipidemia   . Hypertension   . Myocardial infarction ([place]) 04/20/1999  . Obesity (BMI 30-39.9)   . OSA (obstructive sleep apnea) 11/02/2015   Past Surgical History:  Procedure Laterality Date  . CARDIAC CATHETERIZATION N/A 05/19/2015   Procedure: Right/Left Heart Cath and Coronary Angiography;  Surgeon: Belva Crome, MD;  Location: [facility];  Service: Cardiovascular;  Laterality: N/A;  . CORONARY STENT PLACEMENT  04/18/1999   Current Outpatient Medications on File Prior to Visit  Medication Sig Dispense Refill  . aspirin 81 MG tablet Take 81 mg by mouth daily. Reported on 05/05/2015    . atorvastatin (LIPITOR) 80 MG tablet TAKE ONE TABLET BY MOUTH DAILY AT 6PM 90 tablet 0  . carvedilol (COREG) 12.5 MG tablet TAKE ONE TABLET BY MOUTH TWO TIMES A DAY 180 tablet 1  . Cholecalciferol (VITAMIN D) 2000 UNITS CAPS Take 1 capsule by mouth daily.     . sacubitril-valsartan (ENTRESTO) 49-51 MG Take 1 tablet by mouth 2 (two) times daily. 180 tablet 1   No current facility-administered medications on file prior to visit.    Allergies  Allergen Reactions  . Penicillins Swelling   Social History   Socioeconomic History  . Marital status: Married    Spouse name: Not on file  . Number  of children: Not on file  . Years of education: Not on file  . Highest education level: Not on file  Social Needs  . Financial resource strain: Not on file  . Food insecurity - worry: Not on file  . Food insecurity - inability: Not on file  . Transportation needs - medical: Not on file  . Transportation needs - non-medical: Not on file  Occupational History  . Not on file  Tobacco Use  . Smoking status: Former Smoker    Packs/day: 0.50    Years: 8.00    Pack years: 4.00    Types: Cigarettes    Start date: 07/24/1966    Last attempt to quit: 07/24/1974    Years since quitting: 42.6  . Smokeless tobacco: Never Used  Substance and Sexual Activity  . Alcohol use: No    Alcohol/week: 0.0 oz  . Drug use: No  . Sexual activity: Yes    Birth control/protection: None  Other Topics Concern  . Not on file  Social History Narrative  . Not on file     Review of Systems  All other systems reviewed and are negative.      Objective:   Physical Exam  Cardiovascular: Normal rate, regular rhythm and normal heart sounds.  Pulmonary/Chest: Effort normal and breath sounds normal. No respiratory distress. He has no wheezes. He has no rales.  Musculoskeletal:  Lumbar back: He exhibits decreased range of motion and tenderness. He exhibits no bony tenderness and no pain.       Back:       Legs: Neurological: He exhibits normal muscle tone. Coordination normal.  Vitals reviewed.         Assessment & Plan:  Left sided sciatica  Pain sounds consistent with left-sided sciatica most likely from a herniated disc. Begin prednisone taper pack along with Percocet 7.5/325 one by mouth every 6 hours when necessary pain. Reassess in one week. If pain persists, proceed with an x-ray of the spine and then likely an MRI. However anticipate gradual improvement over the next 6 weeks with conservative management. He denies any saddle anesthesia, bowel or bladder incontinence, leg weakness

## 2017-03-18 ENCOUNTER — Telehealth: Payer: Self-pay | Admitting: Family Medicine

## 2017-03-18 NOTE — Telephone Encounter (Signed)
Patient called Benjamin [name] stating that his back is no better and was wanting a work note for all of last week and he left work early for today. When we wrote the note is was for no picking up more then 10 pounds but he states that he can not stand on his feet for long periods of time as his back will start hurting again. He did file a claim at work for it.

## 2017-03-18 NOTE — Progress Notes (Signed)
Subjective: CC: Work injury Date of injury: 03/07/2017 Employer: [employer] PCP: Susy Frizzle, MD NKN:[id number] Benjamin [name] is a 67 y.o. male presenting to clinic today for:  1. Low back pain Patient reports that on Thursday, 3/7, he was moving a cafeteria table and turned and felt himself tweak his left back.  He notes that he did not think anything of it at that time until he started developing low back pain radiating to the left lower extremity a few days later.  He notes that pain seems to be worse after walking or standing for prolonged amount of time.  Some prolonged sitting will also aggravate it.  He actually saw his primary care doctor on 03/11/2017 and was prescribed prednisone and Percocet.  He notes that neither the prednisone nor the Percocet have helped with the low back pain.  He denies lower extremity weakness, falls, buckling of the legs.  He does note that occasionally his left foot tends to fall asleep.  He reports that he is tried to go back to work a couple of times now and each time he is only lasted about a half an hour before pain became severe.  He is also used ibuprofen with little improvement in symptoms.  Denies saddle anesthesia, urinary retention or fecal incontinence.  No prior history of low back pain, back injury or back surgeries.  Allergies  Allergen Reactions  . Penicillins Swelling   Past Medical History:  Diagnosis Date  . Allergy   . ASCVD (arteriosclerotic cardiovascular disease)   . Hyperlipidemia   . Hypertension   . Myocardial infarction ([place]) 04/20/1999  . Obesity (BMI 30-39.9)   . OSA (obstructive sleep apnea) 11/02/2015   Family History  Problem Relation Age of Onset  . AAA (abdominal aortic aneurysm) Father    Social History   Socioeconomic History  . Marital status: Married    Spouse name: Not on file  . Number of children: Not on file  . Years of education: Not on file  . Highest education level: Not on file  Social Needs   . Financial resource strain: Not on file  . Food insecurity - worry: Not on file  . Food insecurity - inability: Not on file  . Transportation needs - medical: Not on file  . Transportation needs - non-medical: Not on file  Occupational History  . Not on file  Tobacco Use  . Smoking status: Former Smoker    Packs/day: 0.50    Years: 8.00    Pack years: 4.00    Types: Cigarettes    Start date: 07/24/1966    Last attempt to quit: 07/24/1974    Years since quitting: 42.6  . Smokeless tobacco: Never Used  Substance and Sexual Activity  . Alcohol use: No    Alcohol/week: 0.0 oz  . Drug use: No  . Sexual activity: Yes    Birth control/protection: None  Other Topics Concern  . Not on file  Social History Narrative  . Not on file   ROS: Per HPI  Objective: Office vital signs reviewed. BP 106/72   Pulse 68   Temp 97.8 F (36.6 C) (Oral)   Ht 5\' 10"  (1.778 m)   Wt 271 lb (122.9 kg)   BMI 38.88 kg/m   Physical Examination:  General: Awake, alert, obese, No acute distress HEENT: sclera white, MMM Pulm: normal work of breathing on room air Extremities: warm, well perfused, No edema, cyanosis or clubbing; +2 pulses bilaterally MSK: antalgic gait  and normal station  Lumbar spine: Patient has full active range of motion in all planes.  He does have some pain with extension.  No midline tenderness to palpation.  Mild paraspinal muscle tenderness to palpation on the left.  He has some pain with straight leg testing on the left.  Right lower extremity: Patient has full, painless active range of motion. 5/5 lower extremity strength in all planes.  Negative FADIR/ FABER  Left lower extremity: Patient has full active range of motion in all planes but does have pain with hip flexion. 4/5 hip strength noted in extension and abduction. 5/5 hip strength flexion and adduction.  Negative FADIR/ FABER. Skin: dry; intact; no rashes or lesions Neuro: 2/4 patellar DTRs bilaterally.  Light touch  sensation grossly in tact bilaterally.  Unable to perform toe walk.  He has normal heel walk.  Dg Lumbar Spine 2-3 Views  Result Date: 03/19/2017 CLINICAL DATA:  Twisted back while moving table EXAM: LUMBAR SPINE - 2 VIEW COMPARISON:  None. FINDINGS: Five lumbar type vertebral bodies are well visualized. Pelvic ring is intact. Mild osteophytic changes are seen. Degenerative anterolisthesis of L4 on L5 is noted. Mild disc space narrowing is noted at L3-4 L4-5 and L5-S1. No soft tissue changes are seen. IMPRESSION: Degenerative change without acute abnormality. Electronically Signed   By: Inez Catalina M.D.   On: 03/19/2017 15:40    Assessment/ Plan: 67 y.o. male   1. Acute left-sided low back pain with left-sided sciatica Patient with no red flags on exam.  X-ray was obtained because he has essentially not responded to prednisone or Percocet.  Personal review of x-ray demonstrated degenerative changes with osteophyte formations particularly the L4 and L5 level.  These are appreciated on both the lateral and AP views.  Radiologist read notes also mild disc space narrowing at levels L3 -4 and L4 -5 and L5 -S1.  I have placed a referral to orthopedics for further evaluation and management.  Patient will likely need physical therapy.  I have placed him with a 10 pound lifting, pushing, pulling restriction and limited his standing to 1 hour total per shift, since standing seems to be what exacerbates his symptoms.  Red flag symptoms and reasons for emergent evaluation emergency department discussed.  Patient was good understanding. - DG Lumbar Spine 2-3 Views; Future - Ambulatory referral to Orthopedic Surgery   Orders Placed This Encounter  Procedures  . DG Lumbar Spine 2-3 Views    Standing Status:   Future    Number of Occurrences:   1    Standing Expiration Date:   05/19/2018    Order Specific Question:   Reason for Exam (SYMPTOM  OR DIAGNOSIS REQUIRED)    Answer:   back pain    Order Specific  Question:   Preferred imaging location?    Answer:   Internal  . Ambulatory referral to Orthopedic Surgery    Referral Priority:   Routine    Referral Type:   Surgical    Referral Reason:   Specialty Services Required    Requested Specialty:   Orthopedic Surgery    Number of Visits Requested:   [place], [place] [phone]

## 2017-03-19 ENCOUNTER — Ambulatory Visit (INDEPENDENT_AMBULATORY_CARE_PROVIDER_SITE_OTHER): Payer: Worker's Compensation | Admitting: Family Medicine

## 2017-03-19 ENCOUNTER — Encounter: Payer: Self-pay | Admitting: Family Medicine

## 2017-03-19 ENCOUNTER — Ambulatory Visit (INDEPENDENT_AMBULATORY_CARE_PROVIDER_SITE_OTHER): Payer: Worker's Compensation

## 2017-03-19 VITALS — BP 106/72 | HR 68 | Temp 97.8°F | Ht 70.0 in | Wt 271.0 lb

## 2017-03-19 DIAGNOSIS — M5442 Lumbago with sciatica, left side: Secondary | ICD-10-CM

## 2017-03-19 NOTE — Telephone Encounter (Signed)
OK with work note for 2 weeks (out of work) for left sciatica.  Needs xray of L spine.

## 2017-03-19 NOTE — Telephone Encounter (Signed)
LMTRC

## 2017-03-20 NOTE — Telephone Encounter (Signed)
Spoke to pt and he is now going through workers comp for this and we will no longer be seeing him for this problem.

## 2017-03-21 ENCOUNTER — Encounter: Payer: Self-pay | Admitting: Family Medicine

## 2017-04-04 ENCOUNTER — Other Ambulatory Visit: Payer: Self-pay | Admitting: Cardiology

## 2017-04-15 ENCOUNTER — Other Ambulatory Visit: Payer: Medicare Other

## 2017-04-15 DIAGNOSIS — I10 Essential (primary) hypertension: Secondary | ICD-10-CM

## 2017-04-15 DIAGNOSIS — E7800 Pure hypercholesterolemia, unspecified: Secondary | ICD-10-CM | POA: Diagnosis not present

## 2017-04-15 DIAGNOSIS — I2510 Atherosclerotic heart disease of native coronary artery without angina pectoris: Secondary | ICD-10-CM | POA: Diagnosis not present

## 2017-04-15 DIAGNOSIS — Z125 Encounter for screening for malignant neoplasm of prostate: Secondary | ICD-10-CM

## 2017-04-15 DIAGNOSIS — R7303 Prediabetes: Secondary | ICD-10-CM | POA: Diagnosis not present

## 2017-04-15 LAB — CBC WITH DIFFERENTIAL/PLATELET
BASOS ABS: 47 {cells}/uL (ref 0–200)
Basophils Relative: 0.6 %
EOS ABS: 250 {cells}/uL (ref 15–500)
Eosinophils Relative: 3.2 %
HCT: 45.7 % (ref 38.5–50.0)
HEMOGLOBIN: 15.2 g/dL (ref 13.2–17.1)
Lymphs Abs: 2878 cells/uL (ref 850–3900)
MCH: 27.7 pg (ref 27.0–33.0)
MCHC: 33.3 g/dL (ref 32.0–36.0)
MCV: 83.4 fL (ref 80.0–100.0)
MONOS PCT: 7.8 %
MPV: 9.9 fL (ref 7.5–12.5)
NEUTROS ABS: 4017 {cells}/uL (ref 1500–7800)
NEUTROS PCT: 51.5 %
Platelets: 251 10*3/uL (ref 140–400)
RBC: 5.48 10*6/uL (ref 4.20–5.80)
RDW: 13.7 % (ref 11.0–15.0)
TOTAL LYMPHOCYTE: 36.9 %
WBC mixed population: 608 cells/uL (ref 200–950)
WBC: 7.8 10*3/uL (ref 3.8–10.8)

## 2017-04-15 LAB — COMPREHENSIVE METABOLIC PANEL
AG RATIO: 1.7 (calc) (ref 1.0–2.5)
ALT: 16 U/L (ref 9–46)
AST: 16 U/L (ref 10–35)
Albumin: 4 g/dL (ref 3.6–5.1)
Alkaline phosphatase (APISO): 99 U/L (ref 40–115)
BUN: 14 mg/dL (ref 7–25)
CALCIUM: 9 mg/dL (ref 8.6–10.3)
CO2: 29 mmol/L (ref 20–32)
Chloride: 106 mmol/L (ref 98–110)
Creat: 1.07 mg/dL (ref 0.70–1.25)
GLUCOSE: 95 mg/dL (ref 65–99)
Globulin: 2.3 g/dL (calc) (ref 1.9–3.7)
Potassium: 5 mmol/L (ref 3.5–5.3)
Sodium: 140 mmol/L (ref 135–146)
Total Bilirubin: 0.6 mg/dL (ref 0.2–1.2)
Total Protein: 6.3 g/dL (ref 6.1–8.1)

## 2017-04-15 LAB — PSA: PSA: 0.9 ng/mL (ref <=4.0)

## 2017-04-15 LAB — LIPID PANEL
Cholesterol: 109 mg/dL (ref <200)
HDL: 43 mg/dL (ref >40)
LDL Cholesterol (Calc): 47 mg/dL (calc)
NON-HDL CHOLESTEROL (CALC): 66 mg/dL (ref <130)
TRIGLYCERIDES: 104 mg/dL (ref <150)
Total CHOL/HDL Ratio: 2.5 (calc) (ref <5.0)

## 2017-04-18 ENCOUNTER — Ambulatory Visit (INDEPENDENT_AMBULATORY_CARE_PROVIDER_SITE_OTHER): Payer: Medicare Other | Admitting: Family Medicine

## 2017-04-18 ENCOUNTER — Ambulatory Visit: Payer: Self-pay | Admitting: Family Medicine

## 2017-04-18 ENCOUNTER — Other Ambulatory Visit: Payer: Self-pay

## 2017-04-18 ENCOUNTER — Encounter: Payer: Self-pay | Admitting: Family Medicine

## 2017-04-18 VITALS — BP 108/70 | HR 63 | Temp 98.3°F | Resp 18 | Ht 70.0 in | Wt 271.0 lb

## 2017-04-18 DIAGNOSIS — I2510 Atherosclerotic heart disease of native coronary artery without angina pectoris: Secondary | ICD-10-CM

## 2017-04-18 DIAGNOSIS — I5022 Chronic systolic (congestive) heart failure: Secondary | ICD-10-CM

## 2017-04-18 DIAGNOSIS — R7303 Prediabetes: Secondary | ICD-10-CM | POA: Diagnosis not present

## 2017-04-18 DIAGNOSIS — I10 Essential (primary) hypertension: Secondary | ICD-10-CM | POA: Diagnosis not present

## 2017-04-18 DIAGNOSIS — Z23 Encounter for immunization: Secondary | ICD-10-CM

## 2017-04-18 LAB — HEMOGLOBIN A1C, FINGERSTICK: Hgb A1C (fingerstick): 6 % OF TOTAL HGB — ABNORMAL HIGH (ref <6.0)

## 2017-04-18 NOTE — Progress Notes (Signed)
Subjective:    Patient ID: Benjamin [name], male    DOB: 11-Nov-1950, 67 y.o.   MRN: [id number]  HPI  04/2016 Patient is a very pleasant 67 year old male here today for follow-up of his vascular disease, congestive heart failure, and prediabetes. Since the last time I saw him, the patient had an echocardiogram that revealed an ejection fraction of 25-30%. He was started on the appropriate medications by his cardiologist he also resumed his CPAP. His most recent echocardiogram was performed in September 2017 revealed an ejection fraction of 45-50%. He is feeling better. However the last time I saw the patient, he was found to be a borderline diabetic with a hemoglobin A1c approaching 6.5. He is eating a high carbohydrate diet. He is not exercising. He is obese. He denies any polyuria, polydipsia, or blurry vision. He denies any chest pain shortness of breath or dyspnea on exertion. He denies any myalgias or right upper quadrant pain on his statin medication.  At that time, my plan was: I will check a CMP and a fasting lipid panel. Goal LDL cholesterol is less than 70. I will repeat a hemoglobin A1c. Goal hemoglobin A1c is less than 6.5. His blood pressure today is at goal. He is on the appropriate medications for congestive heart failure including a beta blocker, an angiotensin receptor blocker, and entresto.  While checking blood work, I will screen the patient for prostate cancer. Spent 10-15 minutes discussing a low carbohydrate diet and increasing aerobic exercise. Recommended 10-20 pounds weight loss  04/18/17 Patient is here today for follow-up.  Since I last saw him, he has been seeing Worker's Comp. due to a back injury with left-sided sciatica.  Finally after several weeks the pain has improved.  He is seeing a orthopedist who has ordered an MRI and the MRI is pending for next week however symptomatically he is doing much better.  His most recent lab work as listed below: Lab on 04/15/2017    Component Date Value Ref Range Status  . WBC 04/15/2017 7.8  3.8 - 10.8 Thousand/uL Final  . RBC 04/15/2017 5.48  4.20 - 5.80 Million/uL Final  . Hemoglobin 04/15/2017 15.2  13.2 - 17.1 g/dL Final  . HCT 04/15/2017 45.7  38.5 - 50.0 % Final  . MCV 04/15/2017 83.4  80.0 - 100.0 fL Final  . MCH 04/15/2017 27.7  27.0 - 33.0 pg Final  . MCHC 04/15/2017 33.3  32.0 - 36.0 g/dL Final  . RDW 04/15/2017 13.7  11.0 - 15.0 % Final  . Platelets 04/15/2017 251  140 - 400 Thousand/uL Final  . MPV 04/15/2017 9.9  7.5 - 12.5 fL Final  . Neutro Abs 04/15/2017 4,017  1,500 - 7,800 cells/uL Final  . Lymphs Abs 04/15/2017 2,878  850 - 3,900 cells/uL Final  . WBC mixed population 04/15/2017 608  200 - 950 cells/uL Final  . Eosinophils Absolute 04/15/2017 250  15 - 500 cells/uL Final  . Basophils Absolute 04/15/2017 47  0 - 200 cells/uL Final  . Neutrophils Relative % 04/15/2017 51.5  % Final  . Total Lymphocyte 04/15/2017 36.9  % Final  . Monocytes Relative 04/15/2017 7.8  % Final  . Eosinophils Relative 04/15/2017 3.2  % Final  . Basophils Relative 04/15/2017 0.6  % Final  . Glucose, Bld 04/15/2017 95  65 - 99 mg/dL Final   Comment: .            Fasting reference interval .   . BUN 04/15/2017 14  7 - 25 mg/dL Final  . Creat 04/15/2017 1.07  0.70 - 1.25 mg/dL Final   Comment: For patients >43 years of age, the reference limit for Creatinine is approximately 13% higher for people identified as African-American. .   Havery Moros Ratio 86/76/1951 NOT APPLICABLE  6 - 22 (calc) Final  . Sodium 04/15/2017 140  135 - 146 mmol/L Final  . Potassium 04/15/2017 5.0  3.5 - 5.3 mmol/L Final  . Chloride 04/15/2017 106  98 - 110 mmol/L Final  . CO2 04/15/2017 29  20 - 32 mmol/L Final  . Calcium 04/15/2017 9.0  8.6 - 10.3 mg/dL Final  . Total Protein 04/15/2017 6.3  6.1 - 8.1 g/dL Final  . Albumin 04/15/2017 4.0  3.6 - 5.1 g/dL Final  . Globulin 04/15/2017 2.3  1.9 - 3.7 g/dL (calc) Final  . AG Ratio  04/15/2017 1.7  1.0 - 2.5 (calc) Final  . Total Bilirubin 04/15/2017 0.6  0.2 - 1.2 mg/dL Final  . Alkaline phosphatase (APISO) 04/15/2017 99  40 - 115 U/L Final  . AST 04/15/2017 16  10 - 35 U/L Final  . ALT 04/15/2017 16  9 - 46 U/L Final  . Cholesterol 04/15/2017 109  <200 mg/dL Final  . HDL 04/15/2017 43  >40 mg/dL Final  . Triglycerides 04/15/2017 104  <150 mg/dL Final  . LDL Cholesterol (Calc) 04/15/2017 47  mg/dL (calc) Final   Comment: Reference range: <100 . Desirable range <100 mg/dL for primary prevention;   <70 mg/dL for patients with CHD or diabetic patients  with > or = 2 CHD risk factors. Marland Kitchen LDL-C is now calculated using the Martin-Hopkins  calculation, which is a validated novel method providing  better accuracy than the Friedewald equation in the  estimation of LDL-C.  Cresenciano Genre et al. Annamaria Helling. 9326;712(45): 2061-2068  (http://education.[employer].com/faq/FAQ164)   . Total CHOL/HDL Ratio 04/15/2017 2.5  <5.0 (calc) Final  . Non-HDL Cholesterol (Calc) 04/15/2017 66  <130 mg/dL (calc) Final   Comment: For patients with diabetes plus 1 major ASCVD risk  factor, treating to a non-HDL-C goal of <100 mg/dL  (LDL-C of <70 mg/dL) is considered a therapeutic  option.   Marland Kitchen PSA 04/15/2017 0.9  < OR = 4.0 ng/mL Final   Comment: The total PSA value from this assay system is  standardized against the WHO standard. The test  result will be approximately 20% lower when compared  to the equimolar-standardized total PSA (Beckman  Coulter). Comparison of serial PSA results should be  interpreted with this fact in mind. . This test was performed using the Siemens  chemiluminescent method. Values obtained from  different assay methods cannot be used interchangeably. PSA levels, regardless of value, should not be interpreted as absolute evidence of the presence or absence of disease.    I was extremely proud of his blood sugar.  His blood sugar is back to a normal level at  95.  Hemoglobin A1c was not drawn that we can add this today on a fingerstick.  Regarding his hyperlipidemia, his LDL cholesterol has improved further and is now well below 70.  There is no evidence of any liver irritation from Lipitor.  His prostate exam are normal with a PSA of 0.9.  The remainder of his lab work was excellent.  Past Medical History:  Diagnosis Date  . Allergy   . ASCVD (arteriosclerotic cardiovascular disease)   . Hyperlipidemia   . Hypertension   . Myocardial infarction ([place]) 04/20/1999  . Obesity (  BMI 30-39.9)   . OSA (obstructive sleep apnea) 11/02/2015   Past Surgical History:  Procedure Laterality Date  . CARDIAC CATHETERIZATION N/A 05/19/2015   Procedure: Right/Left Heart Cath and Coronary Angiography;  Surgeon: Belva Crome, MD;  Location: [facility];  Service: Cardiovascular;  Laterality: N/A;  . CORONARY STENT PLACEMENT  04/18/1999   Current Outpatient Medications on File Prior to Visit  Medication Sig Dispense Refill  . aspirin 81 MG tablet Take 81 mg by mouth daily. Reported on 05/05/2015    . atorvastatin (LIPITOR) 80 MG tablet TAKE ONE TABLET BY MOUTH DAILY AT 6PM 90 tablet 0  . carvedilol (COREG) 12.5 MG tablet TAKE ONE TABLET BY MOUTH TWO TIMES A DAY 180 tablet 1  . Cholecalciferol (VITAMIN D) 2000 UNITS CAPS Take 1 capsule by mouth daily.     Marland Kitchen ENTRESTO 49-51 MG TAKE ONE TABLET BY MOUTH TWO TIMES A DAY 180 tablet 1   No current facility-administered medications on file prior to visit.    Allergies  Allergen Reactions  . Penicillins Swelling   Social History   Socioeconomic History  . Marital status: Married    Spouse name: Not on file  . Number of children: Not on file  . Years of education: Not on file  . Highest education level: Not on file  Occupational History  . Not on file  Social Needs  . Financial resource strain: Not on file  . Food insecurity:    Worry: Not on file    Inability: Not on file  . Transportation needs:     Medical: Not on file    Non-medical: Not on file  Tobacco Use  . Smoking status: Former Smoker    Packs/day: 0.50    Years: 8.00    Pack years: 4.00    Types: Cigarettes    Start date: 07/24/1966    Last attempt to quit: 07/24/1974    Years since quitting: 42.7  . Smokeless tobacco: Never Used  Substance and Sexual Activity  . Alcohol use: No    Alcohol/week: 0.0 oz  . Drug use: No  . Sexual activity: Yes    Birth control/protection: None  Lifestyle  . Physical activity:    Days per week: Not on file    Minutes per session: Not on file  . Stress: Not on file  Relationships  . Social connections:    Talks on phone: Not on file    Gets together: Not on file    Attends religious service: Not on file    Active member of club or organization: Not on file    Attends meetings of clubs or organizations: Not on file    Relationship status: Not on file  . Intimate partner violence:    Fear of current or ex partner: Not on file    Emotionally abused: Not on file    Physically abused: Not on file    Forced sexual activity: Not on file  Other Topics Concern  . Not on file  Social History Narrative  . Not on file     Review of Systems  All other systems reviewed and are negative.      Objective:   Physical Exam  Constitutional: He appears well-developed and well-nourished.  HENT:  Right Ear: Tympanic membrane and ear canal normal.  Left Ear: Tympanic membrane and ear canal normal.  Nose: No mucosal edema or rhinorrhea. Right sinus exhibits no maxillary sinus tenderness and no frontal sinus tenderness. Left  sinus exhibits no maxillary sinus tenderness and no frontal sinus tenderness.  Mouth/Throat: Oropharynx is clear and moist. No oropharyngeal exudate.  Eyes: Pupils are equal, round, and reactive to light. Conjunctivae are normal.  Neck: Neck supple.  Cardiovascular: Normal rate, regular rhythm and normal heart sounds.  No murmur heard. Pulmonary/Chest: Effort normal and  breath sounds normal. No respiratory distress. He has no wheezes. He has no rales.  Lymphadenopathy:    He has no cervical adenopathy.  Vitals reviewed.         Assessment & Plan:  Prediabetes - Plan: Hemoglobin A1C, fingerstick  ASCVD (arteriosclerotic cardiovascular disease)  Benign essential HTN  Chronic systolic heart failure ([place])   Patient is due today for a fingerstick hemoglobin A1c.  Goal hemoglobin A1c is less than 6.5.  Patient is managing his sugars with his diet.  He is now compliant with his sleep apnea.  He feels more rested and is sleeping better throughout the night wearing his CPAP.  With regards to his congestive heart failure, his last echocardiogram showed significant improvement in ejection fraction.  He denies any chest pain shortness of breath or dyspnea on exertion.  He denies any polyuria, polydipsia, or blurry vision.  His fasting lipid panel is excellent.  His liver and kidney function tests are normal.  He received Prevnar 13 today in the office.  The remainder of his preventative care is up-to-date at the present time

## 2017-04-18 NOTE — Addendum Note (Signed)
Addended by: Vonna Kotyk A on: 04/18/2017 03:43 PM   Modules accepted: Orders

## 2017-05-06 ENCOUNTER — Other Ambulatory Visit: Payer: Self-pay | Admitting: Family Medicine

## 2017-08-25 ENCOUNTER — Other Ambulatory Visit: Payer: Self-pay | Admitting: Cardiology

## 2017-08-25 ENCOUNTER — Other Ambulatory Visit: Payer: Self-pay | Admitting: Family Medicine

## 2017-08-28 ENCOUNTER — Other Ambulatory Visit: Payer: Self-pay | Admitting: Cardiology

## 2017-09-17 ENCOUNTER — Ambulatory Visit (INDEPENDENT_AMBULATORY_CARE_PROVIDER_SITE_OTHER): Payer: BC Managed Care – PPO | Admitting: Cardiology

## 2017-09-17 ENCOUNTER — Encounter: Payer: Self-pay | Admitting: Cardiology

## 2017-09-17 VITALS — BP 113/68 | HR 62 | Ht 70.0 in | Wt 270.2 lb

## 2017-09-17 DIAGNOSIS — I2510 Atherosclerotic heart disease of native coronary artery without angina pectoris: Secondary | ICD-10-CM | POA: Diagnosis not present

## 2017-09-17 DIAGNOSIS — E782 Mixed hyperlipidemia: Secondary | ICD-10-CM

## 2017-09-17 DIAGNOSIS — I5022 Chronic systolic (congestive) heart failure: Secondary | ICD-10-CM | POA: Diagnosis not present

## 2017-09-17 NOTE — Patient Instructions (Signed)
Your physician wants you to follow-up in: [place] will receive a reminder letter in the mail two months in advance. If you don't receive a letter, please call our office to schedule the follow-up appointment.  Your physician recommends that you continue on your current medications as directed. Please refer to the Current Medication list given to you today.  WE WILL HAVE YOU AN APPOINTMENT SCHEDULED WITH DR TURNER  Thank you for choosing [facility]!!

## 2017-09-17 NOTE — Progress Notes (Signed)
Clinical Summary Mr. Badie is a 67 y.o.male seen today for follow up of the following medical problems.     1. CAD - previous notes mention MI in 2001 at [facility], cannot find cath report. Patient has stent card that shows he received a stent to his LAD at that time.  - repeat cath 05/2015 that showed patent coronaries. This was down after he was found to have a drop in LVEF>     - no recent chest pain - compliant with meds.   2. Chronic systolic heart failure/NICM - 05/2015 echo showed an LVEF 25-30%, this is a new finding of systolic dysfunction. - 05/2015 cath showed patent coronaries. Normal filling pressures at that time.  - repeat echo 9/217, LVEF improved to 45-50%. Grade I diasotlic dysfunction.    - no recent edema. No SOB/DOE   3. OSA  - followed by Dr Radford Pax - compliant with cpap.   4. HL -04/2017 lipids at goal, compliant with statin.    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   Past Medical History:  Diagnosis Date  . Allergy   . ASCVD (arteriosclerotic cardiovascular disease)   . Hyperlipidemia   . Hypertension   . Myocardial infarction ([place]) 04/20/1999  . Obesity (BMI 30-39.9)   . OSA (obstructive sleep apnea) 11/02/2015     Allergies  Allergen Reactions  . Penicillins Swelling     Current Outpatient Medications  Medication Sig Dispense Refill  . aspirin 81 MG tablet Take 81 mg by mouth daily. Reported on 05/05/2015    . atorvastatin (LIPITOR) 80 MG tablet TAKE ONE TABLET BY MOUTH DAILY AT 6PM 90 tablet 0  . carvedilol (COREG) 12.5 MG tablet TAKE ONE TABLET BY MOUTH TWO TIMES A DAY 180 tablet 1  . Cholecalciferol (VITAMIN D) 2000 UNITS CAPS Take 1 capsule by mouth daily.     Marland Kitchen ENTRESTO 49-51 MG TAKE ONE TABLET BY MOUTH TWO TIMES A DAY 180 tablet 0   No current facility-administered medications for this visit.      Past Surgical History:  Procedure Laterality Date  . CARDIAC CATHETERIZATION N/A 05/19/2015   Procedure:  Right/Left Heart Cath and Coronary Angiography;  Surgeon: Belva Crome, MD;  Location: [facility];  Service: Cardiovascular;  Laterality: N/A;  . CORONARY STENT PLACEMENT  04/18/1999     Allergies  Allergen Reactions  . Penicillins Swelling      Family History  Problem Relation Age of Onset  . AAA (abdominal aortic aneurysm) Father      Social History Mr. Tumolo reports that he quit smoking about 43 years ago. His smoking use included cigarettes. He started smoking about 51 years ago. He has a 4.00 pack-year smoking history. He has never used smokeless tobacco. Mr. Dilorenzo reports that he does not drink alcohol.   Review of Systems CONSTITUTIONAL: No weight loss, fever, chills, weakness or fatigue.  HEENT: Eyes: No visual loss, blurred vision, double vision or yellow sclerae.No hearing loss, sneezing, congestion, runny nose or sore throat.  SKIN: No rash or itching.  CARDIOVASCULAR: per hpi RESPIRATORY: No shortness of breath, cough or sputum.  GASTROINTESTINAL: No anorexia, nausea, vomiting or diarrhea. No abdominal pain or blood.  GENITOURINARY: No burning on urination, no polyuria NEUROLOGICAL: No headache, dizziness, syncope, paralysis, ataxia, numbness or tingling in the extremities. No change in bowel or bladder control.  MUSCULOSKELETAL: No muscle, back pain, joint pain or stiffness.  LYMPHATICS: No enlarged nodes. No history  of splenectomy.  PSYCHIATRIC: No history of depression or anxiety.  ENDOCRINOLOGIC: No reports of sweating, cold or heat intolerance. No polyuria or polydipsia.  Marland Kitchen   Physical Examination Vitals:   09/17/17 1359  BP: 113/68  Pulse: 62   Vitals:   09/17/17 1359  Weight: 270 lb 3.2 oz (122.6 kg)  Height: 5\' 10"  (1.778 m)    Gen: resting comfortably, no acute distress HEENT: no scleral icterus, pupils equal round and reactive, no palptable cervical adenopathy,  CV: RRR, no m/rg, no jvd Resp: Clear to auscultation bilaterally GI:  abdomen is soft, non-tender, non-distended, normal bowel sounds, no hepatosplenomegaly MSK: extremities are warm, no edema.  Skin: warm, no rash Neuro:  no focal deficits Psych: appropriate affect   Diagnostic Studies 05/2015 echo Study Conclusions  - Left ventricle: The cavity size was normal. Wall thickness was   normal. Systolic function was severely reduced. The estimated   ejection fraction was in the range of 25% to 30%. Diffuse   hypokinesis. Doppler parameters are consistent with abnormal left   ventricular relaxation (grade 1 diastolic dysfunction). - Regional wall motion abnormality: Akinesis of the mid anterior,   basal-mid anteroseptal, and apical myocardium. - Aortic valve: Mildly calcified annulus. Trileaflet; mildly   thickened leaflets. Valve area (VTI): 2.42 cm^2. Valve area   (Vmax): 2.32 cm^2. - Atrial septum: No defect or patent foramen ovale was identified. - Technically difficult study. Echocontrast was used to enhance   visualization.     05/2015 cath 1. Prox RCA to Mid RCA lesion, 20% stenosed. 2. Ost LAD to Prox LAD lesion, 30% stenosed. The lesion was previously treated with a stent (unknown type). 3. Dist Cx lesion, 50% stenosed.    Severe anterior apical hypokinesis. Estimated ejection fraction 35%. Normal left ventricular hemodynamics with EDP of 14 mmHg and mean capillary wedge 17 mmHg.  Widely patent coronary arteries with 30% diffuse ISR in the proximal LAD stent. There is 50% eccentric stenosis in the distal circumflex. Luminal irregularities are noted in the right coronary.  Post cath RECOMMENDATIONS:   Aggressive risk factor modification to prevent progression of CAD  Aggressive heart failure therapy to minimize progression of LV dysfunction.   09/2015 echo Study Conclusions  - Left ventricle: The cavity size was normal. Wall thickness was   normal. Systolic function was mildly reduced. The estimated   ejection fraction was in the  range of 45% to 50%. Doppler   parameters are consistent with abnormal left ventricular   relaxation (grade 1 diastolic dysfunction). - Regional wall motion abnormality: Hypokinesis of the mid   anterior, mid anteroseptal, and apical myocardium. - Aortic valve: Mildly calcified annulus. Trileaflet; mildly   thickened leaflets. Valve area (VTI): 3.07 cm^2. Valve area   (Vmax): 2.7 cm^2. Valve area (Vmean): 2.7 cm^2. - Technically difficult study.   Assessment and Plan  1. CAD - history of stent to LAD in 2001 - repeat cath in setting of newly diagnosed systolic heart failure shows patent coronaries - no recent symptoms, continue current meds EKG today with SR, anteroseptal Qwaves.   2. Chronic systolic heart failure/NICM - LVEF improved with medical therapy. - no recent symptoms, continue current meds  3. OSA - he missed his f/u with Dr Radford Pax, we will arrange.   4. Hyperlipidemia - lipids at goal, continue current meds   F/u 1 year      Arnoldo Lenis, M.D.

## 2017-09-20 ENCOUNTER — Telehealth: Payer: Self-pay | Admitting: *Deleted

## 2017-09-20 NOTE — Telephone Encounter (Signed)
CoverMyMeds called to follow up on the prior authorization for entresto that was started on this patient this week. Melissa said a message came back stating that no prior auth was needed that the patient requested a refill too soon.

## 2017-10-08 ENCOUNTER — Telehealth: Payer: Self-pay | Admitting: *Deleted

## 2017-10-08 NOTE — Telephone Encounter (Signed)
PA for Entresto approved through 10/05/2018 - approval fax sent for scanning

## 2017-11-15 ENCOUNTER — Ambulatory Visit (HOSPITAL_COMMUNITY)
Admission: RE | Admit: 2017-11-15 | Discharge: 2017-11-15 | Disposition: A | Discharge status: Home / Self Care | Payer: BC Managed Care – PPO | Source: Ambulatory Visit | Attending: Family Medicine | Admitting: Family Medicine

## 2017-11-15 ENCOUNTER — Telehealth: Payer: Self-pay | Admitting: Family Medicine

## 2017-11-15 ENCOUNTER — Encounter: Payer: Self-pay | Admitting: Family Medicine

## 2017-11-15 ENCOUNTER — Ambulatory Visit: Payer: BC Managed Care – PPO | Admitting: Family Medicine

## 2017-11-15 VITALS — BP 118/70 | HR 75 | Temp 99.8°F | Resp 17 | Ht 70.0 in | Wt 278.0 lb

## 2017-11-15 DIAGNOSIS — R509 Fever, unspecified: Secondary | ICD-10-CM | POA: Insufficient documentation

## 2017-11-15 DIAGNOSIS — R918 Other nonspecific abnormal finding of lung field: Secondary | ICD-10-CM | POA: Insufficient documentation

## 2017-11-15 DIAGNOSIS — I2510 Atherosclerotic heart disease of native coronary artery without angina pectoris: Secondary | ICD-10-CM

## 2017-11-15 DIAGNOSIS — J209 Acute bronchitis, unspecified: Secondary | ICD-10-CM | POA: Diagnosis not present

## 2017-11-15 DIAGNOSIS — R05 Cough: Secondary | ICD-10-CM | POA: Diagnosis not present

## 2017-11-15 LAB — INFLUENZA A AND B AG, IMMUNOASSAY
INFLUENZA A ANTIGEN: NOT DETECTED
INFLUENZA B ANTIGEN: NOT DETECTED

## 2017-11-15 MED ORDER — LEVOFLOXACIN 500 MG PO TABS: 500.0000 mg | ORAL_TABLET | Freq: Every day | ORAL | 0 refills | Status: DC

## 2017-11-15 MED ORDER — PREDNISONE 20 MG PO TABS: ORAL_TABLET | ORAL | 0 refills | Status: DC

## 2017-11-15 NOTE — Telephone Encounter (Signed)
Patient's wife called in very upset today stating that patient has a fever of 104, cough, congestion, and hoarseness. She states that she called in to get him seen here on Wednesday and we were booked up so she took him to a walk in clinic. At the walk in clinic she stated that he was diagnosed with Bronchitis and was prescribed a steroid dose pack, a nasal spray and some cough medicine but since taking these medications he seems to be getting worse. I advised patient that with his fever being high and symptoms not improving on medications she could take him to an urgent care or ER. His wife then stated that since we are his primary care we should be able to accommodate the patient and there is no way she will get him to go to urgent care, and that we should be able to work him in due to this being her second time calling us this week. Please advise?

## 2017-11-15 NOTE — Progress Notes (Signed)
Subjective:    Patient ID: Benjamin [name], male    DOB: 01-29-1950, 67 y.o.   MRN: [id number]  HPI Patient states that he has been sick for approximately 2 weeks.  It began with a cold including head congestion and rhinorrhea.  However earlier this week it suddenly worsened.  He developed a fever to 104, diffuse coughing, increasing shortness of breath, pleurisy, and wheezing.  He was seen in urgent care on Wednesday and was diagnosed with bronchitis and was started on a Z-Pak.  He states that Thursday and into this morning symptoms have worsened.  He continues to run high fever.  He continues to have a cough productive of brown sputum.  He continues to report shortness of breath and chest pain and diffuse body aches.  His flu test today is negative.  Pulmonary exam is significant for diminished breath sounds bilaterally with expiratory wheezing.  He also has rhonchorous breath sounds in the left upper lobe. Past Medical History:  Diagnosis Date  . Allergy   . ASCVD (arteriosclerotic cardiovascular disease)   . Hyperlipidemia   . Hypertension   . Myocardial infarction ([place]) 04/20/1999  . Obesity (BMI 30-39.9)   . OSA (obstructive sleep apnea) 11/02/2015   Past Surgical History:  Procedure Laterality Date  . CARDIAC CATHETERIZATION N/A 05/19/2015   Procedure: Right/Left Heart Cath and Coronary Angiography;  Surgeon: Belva Crome, MD;  Location: [facility];  Service: Cardiovascular;  Laterality: N/A;  . CORONARY STENT PLACEMENT  04/18/1999   Current Outpatient Medications on File Prior to Visit  Medication Sig Dispense Refill  . aspirin 81 MG tablet Take 81 mg by mouth daily. Reported on 05/05/2015    . atorvastatin (LIPITOR) 80 MG tablet TAKE ONE TABLET BY MOUTH DAILY AT 6PM 90 tablet 0  . carvedilol (COREG) 12.5 MG tablet TAKE ONE TABLET BY MOUTH TWO TIMES A DAY 180 tablet 1  . Cholecalciferol (VITAMIN D) 2000 UNITS CAPS Take 1 capsule by mouth daily.     Marland Kitchen ENTRESTO 49-51 MG TAKE ONE  TABLET BY MOUTH TWO TIMES A DAY 180 tablet 0   No current facility-administered medications on file prior to visit.    Allergies  Allergen Reactions  . Penicillins Swelling   Social History   Socioeconomic History  . Marital status: Married    Spouse name: Not on file  . Number of children: Not on file  . Years of education: Not on file  . Highest education level: Not on file  Occupational History  . Not on file  Social Needs  . Financial resource strain: Not on file  . Food insecurity:    Worry: Not on file    Inability: Not on file  . Transportation needs:    Medical: Not on file    Non-medical: Not on file  Tobacco Use  . Smoking status: Former Smoker    Packs/day: 0.50    Years: 8.00    Pack years: 4.00    Types: Cigarettes    Start date: 07/24/1966    Last attempt to quit: 07/24/1974    Years since quitting: 43.3  . Smokeless tobacco: Never Used  Substance and Sexual Activity  . Alcohol use: No    Alcohol/week: 0.0 standard drinks  . Drug use: No  . Sexual activity: Yes    Birth control/protection: None  Lifestyle  . Physical activity:    Days per week: Not on file    Minutes per session: Not on file  .  Stress: Not on file  Relationships  . Social connections:    Talks on phone: Not on file    Gets together: Not on file    Attends religious service: Not on file    Active member of club or organization: Not on file    Attends meetings of clubs or organizations: Not on file    Relationship status: Not on file  . Intimate partner violence:    Fear of current or ex partner: Not on file    Emotionally abused: Not on file    Physically abused: Not on file    Forced sexual activity: Not on file  Other Topics Concern  . Not on file  Social History Narrative  . Not on file      Review of Systems  All other systems reviewed and are negative.      Objective:   Physical Exam  Constitutional: He appears well-developed and well-nourished. No distress.    HENT:  Head: Normocephalic and atraumatic.  Right Ear: Tympanic membrane, external ear and ear canal normal.  Left Ear: Tympanic membrane, external ear and ear canal normal.  Nose: Mucosal edema and rhinorrhea present. Right sinus exhibits no maxillary sinus tenderness and no frontal sinus tenderness. Left sinus exhibits no maxillary sinus tenderness and no frontal sinus tenderness.  Mouth/Throat: Oropharynx is clear and moist. No oropharyngeal exudate, posterior oropharyngeal edema, posterior oropharyngeal erythema or tonsillar abscesses.  Eyes: Conjunctivae are normal.  Cardiovascular: Normal rate, regular rhythm and normal heart sounds.  Pulmonary/Chest: Effort normal. He has decreased breath sounds. He has wheezes in the right upper field, the right lower field, the left upper field and the left lower field. He has rhonchi in the left upper field and the left middle field. He has no rales.  Abdominal: Soft. Bowel sounds are normal. He exhibits no distension and no mass. There is no tenderness. There is no rebound and no guarding.  Skin: He is not diaphoretic.  Vitals reviewed.         Assessment & Plan:  Fever, unspecified fever cause - Plan: Influenza A and B Ag, Immunoassay, DG Chest 2 View  Bronchitis with bronchospasm  Flu test is negative.  I will send the patient for chest x-ray.  He appears to have bronchitis with underlying bronchospasm although I cannot exclude a community-acquired pneumonia.  I recommended switching his antibiotics to Levaquin to extend his antibiotic coverage for possible resistant Streptococcus.  I will start the patient on a prednisone taper pack given his wheezing and bronchospasm.  Recheck the patient on Monday or immediately if worse.  Discontinue Z-Pak

## 2017-11-22 ENCOUNTER — Other Ambulatory Visit: Payer: Self-pay | Admitting: Family Medicine

## 2017-11-23 ENCOUNTER — Other Ambulatory Visit: Payer: Self-pay | Admitting: Cardiology

## 2017-12-03 ENCOUNTER — Other Ambulatory Visit: Payer: Self-pay | Admitting: Family Medicine

## 2017-12-03 ENCOUNTER — Telehealth: Payer: Self-pay | Admitting: Family Medicine

## 2017-12-03 NOTE — Telephone Encounter (Signed)
Pt called [facility] that he was still sick and coughing up phelm and wanted to know if he needed another antibx?   His wife has called me 3 additional times today and her last vm she left was very rude and informed us that "she had lived in 38 different states that we were the worse doctors office she has ever had to deal with and he may be dead in 24-48 before she gets a call back. She understands he is not our only patient however he is her only husband."  Dr. Dennard Schaumann informed of message and states that he can have another round of antibiotics but will need to be seen if no improvement.   Pt aware of refill on antibiotic and informed to follow-up if no better after this.

## 2017-12-13 ENCOUNTER — Other Ambulatory Visit: Payer: Self-pay | Admitting: Family Medicine

## 2018-02-18 ENCOUNTER — Ambulatory Visit (INDEPENDENT_AMBULATORY_CARE_PROVIDER_SITE_OTHER): Payer: BC Managed Care – PPO | Admitting: Family Medicine

## 2018-02-18 ENCOUNTER — Encounter: Payer: Self-pay | Admitting: Family Medicine

## 2018-02-18 VITALS — BP 110/74 | HR 65 | Temp 98.2°F | Resp 18 | Ht 70.0 in | Wt 268.0 lb

## 2018-02-18 DIAGNOSIS — R7303 Prediabetes: Secondary | ICD-10-CM | POA: Diagnosis not present

## 2018-02-18 DIAGNOSIS — N6310 Unspecified lump in the right breast, unspecified quadrant: Secondary | ICD-10-CM

## 2018-02-18 NOTE — Progress Notes (Signed)
Subjective:    Patient ID: Benjamin [name], male    DOB: 1950-10-16, 68 y.o.   MRN: [id number]  HPI Approximately 1 month ago, the patient had a toothpick behind his right ear.  He dropped a toothpick from his ear and as he reached to catch the toothpick he accidentally slammed the toothpick into his right breast as well as into his right hand when he reflexively tried to catch it.  He thought that he had removed the toothpick in its entirety from the breast and from his hand.  However ever since that time, a mass has formed in the right breast.  It is approximately 2 cm x 1.5 cm.  It is well-circumscribed firm and rubbery in nature.  It is also slightly tender to touch.  The skin overlying it is slightly erythematous and there also appears to be a small dimple in the skin overlying the mass.  Patient is also due for fasting lab work to monitor his prediabetes.  He is also reporting neuropathic pain in both feet.  He reports a dull aching pain as well as numbness in the arches of both feet with no exacerbating or alleviating factors.  Diabetic foot exam was performed today and is normal Past Medical History:  Diagnosis Date  . Allergy   . ASCVD (arteriosclerotic cardiovascular disease)   . Hyperlipidemia   . Hypertension   . Myocardial infarction ([place]) 04/20/1999  . Obesity (BMI 30-39.9)   . OSA (obstructive sleep apnea) 11/02/2015   Past Surgical History:  Procedure Laterality Date  . CARDIAC CATHETERIZATION N/A 05/19/2015   Procedure: Right/Left Heart Cath and Coronary Angiography;  Surgeon: Belva Crome, MD;  Location: [facility];  Service: Cardiovascular;  Laterality: N/A;  . CORONARY STENT PLACEMENT  04/18/1999   Current Outpatient Medications on File Prior to Visit  Medication Sig Dispense Refill  . aspirin 81 MG tablet Take 81 mg by mouth daily. Reported on 05/05/2015    . atorvastatin (LIPITOR) 80 MG tablet TAKE ONE TABLET BY MOUTH DAILY AT 6PM 90 tablet 0  . carvedilol  (COREG) 12.5 MG tablet TAKE ONE TABLET BY MOUTH TWO TIMES A DAY 180 tablet 1  . Cholecalciferol (VITAMIN D) 2000 UNITS CAPS Take 1 capsule by mouth daily.     Marland Kitchen ENTRESTO 49-51 MG TAKE ONE TABLET BY MOUTH TWO TIMES A DAY 180 tablet 1  . levofloxacin (LEVAQUIN) 500 MG tablet TAKE ONE TABLET BY MOUTH DAILY 7 tablet 0  . predniSONE (DELTASONE) 10 MG tablet TAKE 6 TABLETS BY MOUTH ONCE A DAY FOR 2 DAYS, THEN 4 TABLETS ONCE A DAY FOR 2 DAYS, THEN 2 TABLETS ONCE A DAY FOR 2 DAYS . TAKE WITH FOOD. 24 tablet 0   No current facility-administered medications on file prior to visit.    Allergies  Allergen Reactions  . Penicillins Swelling   Social History   Socioeconomic History  . Marital status: Married    Spouse name: Not on file  . Number of children: Not on file  . Years of education: Not on file  . Highest education level: Not on file  Occupational History  . Not on file  Social Needs  . Financial resource strain: Not on file  . Food insecurity:    Worry: Not on file    Inability: Not on file  . Transportation needs:    Medical: Not on file    Non-medical: Not on file  Tobacco Use  . Smoking status: Former Smoker  Packs/day: 0.50    Years: 8.00    Pack years: 4.00    Types: Cigarettes    Start date: 07/24/1966    Last attempt to quit: 07/24/1974    Years since quitting: 43.6  . Smokeless tobacco: Never Used  Substance and Sexual Activity  . Alcohol use: No    Alcohol/week: 0.0 standard drinks  . Drug use: No  . Sexual activity: Yes    Birth control/protection: None  Lifestyle  . Physical activity:    Days per week: Not on file    Minutes per session: Not on file  . Stress: Not on file  Relationships  . Social connections:    Talks on phone: Not on file    Gets together: Not on file    Attends religious service: Not on file    Active member of club or organization: Not on file    Attends meetings of clubs or organizations: Not on file    Relationship status: Not on  file  . Intimate partner violence:    Fear of current or ex partner: Not on file    Emotionally abused: Not on file    Physically abused: Not on file    Forced sexual activity: Not on file  Other Topics Concern  . Not on file  Social History Narrative  . Not on file      Review of Systems  All other systems reviewed and are negative.      Objective:   Physical Exam Vitals signs reviewed.  Constitutional:      General: He is not in acute distress.    Appearance: He is well-developed. He is not diaphoretic.  HENT:     Head: Normocephalic and atraumatic.     Right Ear: Tympanic membrane, ear canal and external ear normal.     Left Ear: Tympanic membrane, ear canal and external ear normal.     Nose: Mucosal edema present. No congestion or rhinorrhea.     Right Sinus: No maxillary sinus tenderness or frontal sinus tenderness.     Left Sinus: No maxillary sinus tenderness or frontal sinus tenderness.     Mouth/Throat:     Pharynx: No oropharyngeal exudate or posterior oropharyngeal erythema.     Tonsils: No tonsillar abscesses.  Eyes:     Conjunctiva/sclera: Conjunctivae normal.  Cardiovascular:     Rate and Rhythm: Normal rate and regular rhythm.     Heart sounds: Normal heart sounds.  Pulmonary:     Effort: Pulmonary effort is normal.     Breath sounds: Normal breath sounds. No wheezing, rhonchi or rales.  Chest:     Chest wall: Mass and tenderness present.    Abdominal:     General: Bowel sounds are normal. There is no distension.     Palpations: Abdomen is soft. There is no mass.     Tenderness: There is no abdominal tenderness. There is no guarding or rebound.           Assessment & Plan:  Prediabetes - Plan: COMPLETE METABOLIC PANEL WITH GFR, CBC with Differential/Platelet, Lipid panel, Hemoglobin A1c  Breast mass, right - Plan: Ambulatory referral to General Surgery  I believe the patient has form some type of granuloma due to foreign material from the  toothpick or possibly a cyst although I cannot rule out a malignancy in the breast.  Therefore I will consult general surgery for an excisional biopsy.  I have asked the patient to return fasting for a CMP,  fasting lipid panel, and hemoglobin A1c to monitor the management of his diabetes.  Diabetic foot exam was performed today and is normal however I suspect that the pain in his feet is neuropathic in nature.  Neck step in work-up for this would be nerve conduction studies.  Patient defers this at the present time until after the masses been removed from his right breast

## 2018-02-20 ENCOUNTER — Other Ambulatory Visit: Payer: Self-pay | Admitting: Cardiology

## 2018-02-27 ENCOUNTER — Ambulatory Visit (INDEPENDENT_AMBULATORY_CARE_PROVIDER_SITE_OTHER): Payer: BC Managed Care – PPO | Admitting: General Surgery

## 2018-02-27 ENCOUNTER — Encounter: Payer: Self-pay | Admitting: General Surgery

## 2018-02-27 VITALS — BP 115/65 | HR 58 | Temp 97.3°F | Resp 20 | Wt 267.6 lb

## 2018-02-27 DIAGNOSIS — L03319 Cellulitis of trunk, unspecified: Secondary | ICD-10-CM

## 2018-02-27 DIAGNOSIS — L02219 Cutaneous abscess of trunk, unspecified: Secondary | ICD-10-CM

## 2018-02-27 MED ORDER — DOXYCYCLINE HYCLATE 50 MG PO CAPS: 50.0000 mg | ORAL_CAPSULE | Freq: Two times a day (BID) | ORAL | 0 refills | Status: DC

## 2018-02-27 NOTE — Progress Notes (Signed)
Roberth Berling; [id number]; 02-Oct-1950   HPI Patient is a 68 year old white male who was referred to my care by Dr. Dennard Schaumann for evaluation treatment of an abscess on his right breast.  Patient states that 2 months ago, he had a toothpick that got embedded in the right breast.  He did pull it out, but there was some bleeding there present.  Since that time, he has had intermittent episodes of drainage from this wound.  He currently has 0 out of 10 pain at that site.  No fevers or chills have been noted. Past Medical History:  Diagnosis Date  . Allergy   . ASCVD (arteriosclerotic cardiovascular disease)   . Hyperlipidemia   . Hypertension   . Myocardial infarction ([place]) 04/20/1999  . Obesity (BMI 30-39.9)   . OSA (obstructive sleep apnea) 11/02/2015    Past Surgical History:  Procedure Laterality Date  . CARDIAC CATHETERIZATION N/A 05/19/2015   Procedure: Right/Left Heart Cath and Coronary Angiography;  Surgeon: Belva Crome, MD;  Location: [facility];  Service: Cardiovascular;  Laterality: N/A;  . CORONARY STENT PLACEMENT  04/18/1999    Family History  Problem Relation Age of Onset  . AAA (abdominal aortic aneurysm) Father     Current Outpatient Medications on File Prior to Visit  Medication Sig Dispense Refill  . aspirin 81 MG tablet Take 81 mg by mouth daily. Reported on 05/05/2015    . atorvastatin (LIPITOR) 80 MG tablet TAKE ONE TABLET BY MOUTH DAILY AT 6PM 90 tablet 0  . carvedilol (COREG) 12.5 MG tablet TAKE ONE TABLET BY MOUTH TWO TIMES A DAY 180 tablet 2  . Cholecalciferol (VITAMIN D) 2000 UNITS CAPS Take 1 capsule by mouth daily.     Marland Kitchen ENTRESTO 49-51 MG TAKE ONE TABLET BY MOUTH TWO TIMES A DAY 180 tablet 1   No current facility-administered medications on file prior to visit.     Allergies  Allergen Reactions  . Penicillins Swelling    Social History   Substance and Sexual Activity  Alcohol Use No  . Alcohol/week: 0.0 standard drinks    Social History    Tobacco Use  Smoking Status Former Smoker  . Packs/day: 0.50  . Years: 8.00  . Pack years: 4.00  . Types: Cigarettes  . Start date: 07/24/1966  . Last attempt to quit: 07/24/1974  . Years since quitting: 43.6  Smokeless Tobacco Never Used    Review of Systems  Constitutional: Negative.   HENT: Negative.   Eyes: Negative.   Respiratory: Negative.   Cardiovascular: Negative.   Gastrointestinal: Negative.   Genitourinary: Negative.   Musculoskeletal: Negative.   Skin: Negative.   Neurological: Negative.   Endo/Heme/Allergies: Negative.   Psychiatric/Behavioral: Negative.     Objective   Vitals:   02/27/18 1126  BP: 115/65  Pulse: (!) 58  Resp: 20  Temp: (!) 97.3 F (36.3 C)    Physical Exam Vitals signs reviewed.  Constitutional:      Appearance: Normal appearance. He is not ill-appearing.  HENT:     Head: Normocephalic and atraumatic.  Cardiovascular:     Rate and Rhythm: Normal rate and regular rhythm.     Heart sounds: Normal heart sounds. No murmur. No friction rub. No gallop.   Pulmonary:     Effort: Pulmonary effort is normal. No respiratory distress.     Breath sounds: Normal breath sounds. No stridor. No wheezing, rhonchi or rales.  Skin:    General: Skin is warm and dry.  Comments: Punctate fluctuant wound in the right upper quadrant of the right breast.  I was able to express some purulent fluid.  After applying topical freeze spray, a small incision was made.  Some purulent fluid and blood was expressed.  I could not definitively see a foreign body present.  Triple antibiotic ointment was applied.  Patient tolerated the procedure well.  Neurological:     Mental Status: He is alert and oriented to person, place, and time.   Dr. Samella Parr notes reviewed  Assessment  Abscess, right breast secondary to puncture wound by toothpick Plan   Doxycycline has been started.  Patient instructed to keep the wound clean and dry twice a day then apply triple  antibiotic ointment.  I will see him again in 5 days to reassess the wound.  Should this continue to be inflamed, formal incision and drainage in the operating room may be needed.

## 2018-03-04 ENCOUNTER — Ambulatory Visit: Payer: BC Managed Care – PPO | Admitting: General Surgery

## 2018-03-05 ENCOUNTER — Other Ambulatory Visit: Payer: BC Managed Care – PPO

## 2018-03-06 LAB — HEMOGLOBIN A1C
EAG (MMOL/L): 7.4 (calc)
Hgb A1c MFr Bld: 6.3 % of total Hgb — ABNORMAL HIGH (ref <5.7)
Mean Plasma Glucose: 134 (calc)

## 2018-03-06 LAB — COMPLETE METABOLIC PANEL WITH GFR
AG Ratio: 1.9 (calc) (ref 1.0–2.5)
ALT: 17 U/L (ref 9–46)
AST: 16 U/L (ref 10–35)
Albumin: 4 g/dL (ref 3.6–5.1)
Alkaline phosphatase (APISO): 102 U/L (ref 35–144)
BUN: 12 mg/dL (ref 7–25)
CALCIUM: 9.2 mg/dL (ref 8.6–10.3)
CO2: 30 mmol/L (ref 20–32)
Chloride: 106 mmol/L (ref 98–110)
Creat: 0.99 mg/dL (ref 0.70–1.25)
GFR, EST NON AFRICAN AMERICAN: 78 mL/min/{1.73_m2} (ref >=60)
GFR, Est African American: 90 mL/min/{1.73_m2} (ref >=60)
Globulin: 2.1 g/dL (calc) (ref 1.9–3.7)
Glucose, Bld: 101 mg/dL — ABNORMAL HIGH (ref 65–99)
Potassium: 4.7 mmol/L (ref 3.5–5.3)
Sodium: 142 mmol/L (ref 135–146)
Total Bilirubin: 0.9 mg/dL (ref 0.2–1.2)
Total Protein: 6.1 g/dL (ref 6.1–8.1)

## 2018-03-06 LAB — CBC WITH DIFFERENTIAL/PLATELET
ABSOLUTE MONOCYTES: 703 {cells}/uL (ref 200–950)
BASOS PCT: 0.8 %
Basophils Absolute: 71 cells/uL (ref 0–200)
Eosinophils Absolute: 401 cells/uL (ref 15–500)
Eosinophils Relative: 4.5 %
HCT: 44.7 % (ref 38.5–50.0)
Hemoglobin: 14.9 g/dL (ref 13.2–17.1)
Lymphs Abs: 3133 cells/uL (ref 850–3900)
MCH: 28.1 pg (ref 27.0–33.0)
MCHC: 33.3 g/dL (ref 32.0–36.0)
MCV: 84.3 fL (ref 80.0–100.0)
MPV: 10.2 fL (ref 7.5–12.5)
Monocytes Relative: 7.9 %
Neutro Abs: 4592 cells/uL (ref 1500–7800)
Neutrophils Relative %: 51.6 %
Platelets: 246 10*3/uL (ref 140–400)
RBC: 5.3 10*6/uL (ref 4.20–5.80)
RDW: 13.2 % (ref 11.0–15.0)
Total Lymphocyte: 35.2 %
WBC: 8.9 10*3/uL (ref 3.8–10.8)

## 2018-03-06 LAB — LIPID PANEL
Cholesterol: 110 mg/dL (ref <200)
HDL: 42 mg/dL (ref >=40)
LDL Cholesterol (Calc): 50 mg/dL (calc)
Non-HDL Cholesterol (Calc): 68 mg/dL (calc) (ref <130)
Total CHOL/HDL Ratio: 2.6 (calc) (ref <5.0)
Triglycerides: 93 mg/dL (ref <150)

## 2018-05-12 ENCOUNTER — Telehealth: Payer: Self-pay

## 2018-05-12 NOTE — Telephone Encounter (Signed)
Pt is  scheduled for colonoscopy tomorrow at [facility]..Anesthesia wants cardiac clearance    Phone [phone]   Fax [phone] fax attn Caryl Pina

## 2018-05-12 NOTE — Progress Notes (Signed)
From cardiology standpoint ok to proceed with colonoscopy as plannned.   Carlyle Dolly MD

## 2018-05-12 NOTE — Telephone Encounter (Signed)
Telephone note placed in chart with clearance, ok to proceed with colonoscopy   Zandra Abts MD

## 2018-05-12 NOTE — Telephone Encounter (Signed)
Ashley notified.

## 2018-05-13 DIAGNOSIS — K635 Polyp of colon: Secondary | ICD-10-CM | POA: Diagnosis not present

## 2018-05-13 DIAGNOSIS — D123 Benign neoplasm of transverse colon: Secondary | ICD-10-CM | POA: Diagnosis not present

## 2018-05-13 DIAGNOSIS — D122 Benign neoplasm of ascending colon: Secondary | ICD-10-CM | POA: Diagnosis not present

## 2018-05-13 DIAGNOSIS — Z1211 Encounter for screening for malignant neoplasm of colon: Secondary | ICD-10-CM | POA: Diagnosis not present

## 2018-05-13 DIAGNOSIS — D124 Benign neoplasm of descending colon: Secondary | ICD-10-CM | POA: Diagnosis not present

## 2018-05-13 LAB — HM COLONOSCOPY

## 2018-05-15 ENCOUNTER — Encounter: Payer: Self-pay | Admitting: Family Medicine

## 2018-05-15 ENCOUNTER — Other Ambulatory Visit: Payer: Self-pay

## 2018-05-15 ENCOUNTER — Ambulatory Visit (INDEPENDENT_AMBULATORY_CARE_PROVIDER_SITE_OTHER): Payer: BC Managed Care – PPO | Admitting: Family Medicine

## 2018-05-15 VITALS — BP 110/68 | HR 76 | Temp 99.0°F | Resp 18 | Ht 70.0 in | Wt 274.0 lb

## 2018-05-15 DIAGNOSIS — J329 Chronic sinusitis, unspecified: Secondary | ICD-10-CM

## 2018-05-15 DIAGNOSIS — H60311 Diffuse otitis externa, right ear: Secondary | ICD-10-CM

## 2018-05-15 MED ORDER — LEVOCETIRIZINE DIHYDROCHLORIDE 5 MG PO TABS: 5.0000 mg | ORAL_TABLET | Freq: Every evening | ORAL | 0 refills | Status: DC

## 2018-05-15 MED ORDER — CIPROFLOXACIN-DEXAMETHASONE 0.3-0.1 % OT SUSP: 4.0000 [drp] | Freq: Two times a day (BID) | OTIC | 0 refills | Status: DC

## 2018-05-15 NOTE — Progress Notes (Signed)
Subjective:    Patient ID: Benjamin [name], male    DOB: 1950-03-09, 68 y.o.   MRN: [id number]  HPI Symptoms began yesterday with right ear pain.  He also has had congestion with rhinorrhea and sneezing.  He denies any fever.  His temperature yesterday was 98.  Today it is 99.  He denies any shortness of breath.  He denies any cough.  On physical exam, his right auditory canal is very swollen.  There is also white exudate throughout the canal consistent with otitis externa.  However he is also very congested with clear rhinorrhea and sneezing  Past Medical History:  Diagnosis Date  . Allergy   . ASCVD (arteriosclerotic cardiovascular disease)   . Hyperlipidemia   . Hypertension   . Myocardial infarction ([place]) 04/20/1999  . Obesity (BMI 30-39.9)   . OSA (obstructive sleep apnea) 11/02/2015   Past Surgical History:  Procedure Laterality Date  . CARDIAC CATHETERIZATION N/A 05/19/2015   Procedure: Right/Left Heart Cath and Coronary Angiography;  Surgeon: Belva Crome, MD;  Location: [facility];  Service: Cardiovascular;  Laterality: N/A;  . CORONARY STENT PLACEMENT  04/18/1999   Current Outpatient Medications on File Prior to Visit  Medication Sig Dispense Refill  . aspirin 81 MG tablet Take 81 mg by mouth daily. Reported on 05/05/2015    . atorvastatin (LIPITOR) 80 MG tablet TAKE ONE TABLET BY MOUTH DAILY AT 6PM 90 tablet 0  . carvedilol (COREG) 12.5 MG tablet TAKE ONE TABLET BY MOUTH TWO TIMES A DAY 180 tablet 2  . Cholecalciferol (VITAMIN D) 2000 UNITS CAPS Take 1 capsule by mouth daily.     Marland Kitchen doxycycline (VIBRAMYCIN) 50 MG capsule Take 1 capsule (50 mg total) by mouth 2 (two) times daily. 14 capsule 0  . ENTRESTO 49-51 MG TAKE ONE TABLET BY MOUTH TWO TIMES A DAY 180 tablet 1   No current facility-administered medications on file prior to visit.    Allergies  Allergen Reactions  . Penicillins Swelling   Social History   Socioeconomic History  . Marital status: Married     Spouse name: Not on file  . Number of children: Not on file  . Years of education: Not on file  . Highest education level: Not on file  Occupational History  . Not on file  Social Needs  . Financial resource strain: Not on file  . Food insecurity:    Worry: Not on file    Inability: Not on file  . Transportation needs:    Medical: Not on file    Non-medical: Not on file  Tobacco Use  . Smoking status: Former Smoker    Packs/day: 0.50    Years: 8.00    Pack years: 4.00    Types: Cigarettes    Start date: 07/24/1966    Last attempt to quit: 07/24/1974    Years since quitting: 43.8  . Smokeless tobacco: Never Used  Substance and Sexual Activity  . Alcohol use: No    Alcohol/week: 0.0 standard drinks  . Drug use: No  . Sexual activity: Yes    Birth control/protection: None  Lifestyle  . Physical activity:    Days per week: Not on file    Minutes per session: Not on file  . Stress: Not on file  Relationships  . Social connections:    Talks on phone: Not on file    Gets together: Not on file    Attends religious service: Not on file  Active member of club or organization: Not on file    Attends meetings of clubs or organizations: Not on file    Relationship status: Not on file  . Intimate partner violence:    Fear of current or ex partner: Not on file    Emotionally abused: Not on file    Physically abused: Not on file    Forced sexual activity: Not on file  Other Topics Concern  . Not on file  Social History Narrative  . Not on file      Review of Systems  All other systems reviewed and are negative.      Objective:   Physical Exam Vitals signs reviewed.  Constitutional:      General: He is not in acute distress.    Appearance: Normal appearance. He is not ill-appearing or toxic-appearing.  HENT:     Right Ear: Tympanic membrane and ear canal normal. Drainage, swelling and tenderness present. No middle ear effusion. There is no impacted cerumen.      Left Ear: Tympanic membrane, ear canal and external ear normal. Swelling present. No tenderness.  No middle ear effusion.     Nose: Congestion and rhinorrhea present. Rhinorrhea is clear.  Cardiovascular:     Rate and Rhythm: Normal rate and regular rhythm.     Heart sounds: Normal heart sounds.  Pulmonary:     Effort: Pulmonary effort is normal. No respiratory distress.     Breath sounds: Normal breath sounds. No wheezing, rhonchi or rales.  Lymphadenopathy:     Cervical: No cervical adenopathy.  Neurological:     Mental Status: He is alert.           Assessment & Plan:  Acute diffuse otitis externa of right ear  Rhinosinusitis  Begin Ciprodex 4 drops each ear canal but especially the right twice daily for 7 days.  Treat the rhinosinusitis his allergies with Xyzal 5 mg daily.  If he develops a fever and cough, I would recommend COVID testing due to his medical complications including congestive heart failure and ASCVD.  Patient is recommended to stay out of work today tomorrow and through the weekend.  If symptoms are worsening we will refer the patient for testing however I believe he is most likely got otitis externa as well as allergic rhinosinusitis

## 2018-05-19 ENCOUNTER — Other Ambulatory Visit: Payer: Self-pay

## 2018-05-19 ENCOUNTER — Ambulatory Visit (INDEPENDENT_AMBULATORY_CARE_PROVIDER_SITE_OTHER): Payer: BC Managed Care – PPO | Admitting: Family Medicine

## 2018-05-19 DIAGNOSIS — J329 Chronic sinusitis, unspecified: Secondary | ICD-10-CM | POA: Diagnosis not present

## 2018-05-19 DIAGNOSIS — H6691 Otitis media, unspecified, right ear: Secondary | ICD-10-CM

## 2018-05-19 DIAGNOSIS — J310 Chronic rhinitis: Secondary | ICD-10-CM

## 2018-05-19 MED ORDER — LEVOFLOXACIN 500 MG PO TABS: 500.0000 mg | ORAL_TABLET | Freq: Every day | ORAL | 0 refills | Status: DC

## 2018-05-19 NOTE — Progress Notes (Signed)
Subjective:    Patient ID: Benjamin [name], male    DOB: 12-19-1950, 68 y.o.   MRN: [id number]  HPI  05/15/18 Symptoms began yesterday with right ear pain.  He also has had congestion with rhinorrhea and sneezing.  He denies any fever.  His temperature yesterday was 98.  Today it is 99.  He denies any shortness of breath.  He denies any cough.  On physical exam, his right auditory canal is very swollen.  There is also white exudate throughout the canal consistent with otitis externa.  However he is also very congested with clear rhinorrhea and sneezing.  At that time, my plan was: Begin Ciprodex 4 drops each ear canal but especially the right twice daily for 7 days.  Treat the rhinosinusitis his allergies with Xyzal 5 mg daily.  If he develops a fever and cough, I would recommend COVID testing due to his medical complications including congestive heart failure and ASCVD.  Patient is recommended to stay out of work today tomorrow and through the weekend.  If symptoms are worsening we will refer the patient for testing however I believe he is most likely got otitis externa as well as allergic rhinosinusitis  05/19/18 The pain in the patient's right ear is worse.  Despite using the drops for the last 4 days he is seen no benefit.  Now the pain is spread behind his right eye.  He continues to have nasal congestion and sinus pressure.  He is also now experiencing vertigo when he rolls over in bed.  He states he is noticed decreased hearing in his right ear.  He feels like he is talking with an echo whenever he speaks like his head is in a tunnel.  He reports sharp pain in his right ear at times.  He reports a constant pressure-like pain in his right ear as well.  He denies any fevers or chills.  Past Medical History:  Diagnosis Date  . Allergy   . ASCVD (arteriosclerotic cardiovascular disease)   . Hyperlipidemia   . Hypertension   . Myocardial infarction ([place]) 04/20/1999  . Obesity (BMI 30-39.9)   .  OSA (obstructive sleep apnea) 11/02/2015   Past Surgical History:  Procedure Laterality Date  . CARDIAC CATHETERIZATION N/A 05/19/2015   Procedure: Right/Left Heart Cath and Coronary Angiography;  Surgeon: Belva Crome, MD;  Location: [facility];  Service: Cardiovascular;  Laterality: N/A;  . CORONARY STENT PLACEMENT  04/18/1999   Current Outpatient Medications on File Prior to Visit  Medication Sig Dispense Refill  . aspirin 81 MG tablet Take 81 mg by mouth daily. Reported on 05/05/2015    . atorvastatin (LIPITOR) 80 MG tablet TAKE ONE TABLET BY MOUTH DAILY AT 6PM 90 tablet 0  . carvedilol (COREG) 12.5 MG tablet TAKE ONE TABLET BY MOUTH TWO TIMES A DAY 180 tablet 2  . Cholecalciferol (VITAMIN D) 2000 UNITS CAPS Take 1 capsule by mouth daily.     . ciprofloxacin-dexamethasone (CIPRODEX) OTIC suspension Place 4 drops into both ears 2 (two) times daily. 7.5 mL 0  . ENTRESTO 49-51 MG TAKE ONE TABLET BY MOUTH TWO TIMES A DAY 180 tablet 1  . levocetirizine (XYZAL) 5 MG tablet Take 1 tablet (5 mg total) by mouth every evening. 30 tablet 0   No current facility-administered medications on file prior to visit.    Allergies  Allergen Reactions  . Penicillins Swelling   Social History   Socioeconomic History  . Marital status: Married  Spouse name: Not on file  . Number of children: Not on file  . Years of education: Not on file  . Highest education level: Not on file  Occupational History  . Not on file  Social Needs  . Financial resource strain: Not on file  . Food insecurity:    Worry: Not on file    Inability: Not on file  . Transportation needs:    Medical: Not on file    Non-medical: Not on file  Tobacco Use  . Smoking status: Former Smoker    Packs/day: 0.50    Years: 8.00    Pack years: 4.00    Types: Cigarettes    Start date: 07/24/1966    Last attempt to quit: 07/24/1974    Years since quitting: 43.8  . Smokeless tobacco: Never Used  Substance and Sexual  Activity  . Alcohol use: No    Alcohol/week: 0.0 standard drinks  . Drug use: No  . Sexual activity: Yes    Birth control/protection: None  Lifestyle  . Physical activity:    Days per week: Not on file    Minutes per session: Not on file  . Stress: Not on file  Relationships  . Social connections:    Talks on phone: Not on file    Gets together: Not on file    Attends religious service: Not on file    Active member of club or organization: Not on file    Attends meetings of clubs or organizations: Not on file    Relationship status: Not on file  . Intimate partner violence:    Fear of current or ex partner: Not on file    Emotionally abused: Not on file    Physically abused: Not on file    Forced sexual activity: Not on file  Other Topics Concern  . Not on file  Social History Narrative  . Not on file      Review of Systems  All other systems reviewed and are negative.      Objective:   No exam could be performed today as the patient was seen over the telephone       Assessment & Plan:  Rhinosinusitis  Infective right otitis media  Patient agreed to be seen over the telephone.  Patient is currently at home.  I am currently my office.  Phone call began at 350.  Phone call ended at 401.  Patient's history sounds consistent with an inner ear infection/otitis media.  I recommended he discontinue Ciprodex and given his penicillin allergy I would switch the patient to Levaquin 500 mg p.o. daily for 7 days.  This should also cover sinusitis.  Continue Xyzal.  Reassess in 48 hours if no better or sooner if worse

## 2018-05-21 ENCOUNTER — Other Ambulatory Visit: Payer: Self-pay | Admitting: Cardiology

## 2018-05-21 ENCOUNTER — Other Ambulatory Visit: Payer: Self-pay | Admitting: Family Medicine

## 2018-06-10 ENCOUNTER — Other Ambulatory Visit: Payer: Self-pay | Admitting: Family Medicine

## 2018-07-09 ENCOUNTER — Encounter: Payer: Self-pay | Admitting: *Deleted

## 2018-07-11 ENCOUNTER — Other Ambulatory Visit: Payer: Self-pay | Admitting: Family Medicine

## 2018-07-17 ENCOUNTER — Other Ambulatory Visit: Payer: Self-pay

## 2018-07-17 ENCOUNTER — Encounter: Payer: Self-pay | Admitting: Family Medicine

## 2018-07-17 ENCOUNTER — Ambulatory Visit (INDEPENDENT_AMBULATORY_CARE_PROVIDER_SITE_OTHER): Payer: BC Managed Care – PPO | Admitting: Family Medicine

## 2018-07-17 VITALS — BP 128/66 | HR 64 | Temp 98.4°F | Resp 14 | Ht 70.0 in | Wt 278.0 lb

## 2018-07-17 DIAGNOSIS — H6063 Unspecified chronic otitis externa, bilateral: Secondary | ICD-10-CM

## 2018-07-17 DIAGNOSIS — J329 Chronic sinusitis, unspecified: Secondary | ICD-10-CM | POA: Diagnosis not present

## 2018-07-17 MED ORDER — CIPRODEX 0.3-0.1 % OT SUSP: 4.0000 [drp] | Freq: Two times a day (BID) | OTIC | 0 refills | Status: DC

## 2018-07-17 MED ORDER — FLUTICASONE PROPIONATE 50 MCG/ACT NA SUSP: 2.0000 | Freq: Every day | NASAL | 2 refills | Status: DC

## 2018-07-17 NOTE — Patient Instructions (Signed)
Return in 1-2 weeks for recheck of ears and hearing testing   Follow up sooner if needed   Otitis Externa  Otitis externa is an infection of the outer ear canal. The outer ear canal is the area between the outside of the ear and the eardrum. Otitis externa is sometimes called swimmer's ear. What are the causes? Common causes of this condition include:  Swimming in dirty water.  Moisture in the ear.  An injury to the inside of the ear.  An object stuck in the ear.  A cut or scrape on the outside of the ear. What increases the risk? You are more likely to get this condition if you go swimming often. What are the signs or symptoms?  Itching in the ear. This is often the first symptom.  Swelling of the ear.  Redness in the ear.  Ear pain. The pain may get worse when you pull on your ear.  Pus coming from the ear. How is this treated? This condition may be treated with:  Antibiotic ear drops. These are often given for 10-14 days.  Medicines to reduce itching and swelling. Follow these instructions at home:  If you were given antibiotic ear drops, use them as told by your doctor. Do not stop using them even if your condition gets better.  Take over-the-counter and prescription medicines only as told by your doctor.  Avoid getting water in your ears as told by your doctor. You may be told to avoid swimming or water sports for a few days.  Keep all follow-up visits as told by your doctor. This is important. How is this prevented?  Keep your ears dry. Use the corner of a towel to dry your ears after you swim or bathe.  Try not to scratch or put things in your ear. Doing these things makes it easier for germs to grow in your ear.  Avoid swimming in lakes, dirty water, or pools that may not have the right amount of a chemical called chlorine. Contact a doctor if:  You have a fever.  Your ear is still red, swollen, or painful after 3 days.  You still have pus coming  from your ear after 3 days.  Your redness, swelling, or pain gets worse.  You have a really bad headache.  You have redness, swelling, pain, or tenderness behind your ear. Summary  Otitis externa is an infection of the outer ear canal.  Symptoms include pain, redness, and swelling of the ear.  If you were given antibiotic ear drops, use them as told by your doctor. Do not stop using them even if your condition gets better.  Try not to scratch or put things in your ear. This information is not intended to replace advice given to you by your health care provider. Make sure you discuss any questions you have with your health care provider. Document Released: 06/06/2007 Document Revised: 05/24/2017 Document Reviewed: 05/24/2017 Elsevier Patient Education  2020 [employer].   Eustachian Tube Dysfunction  Eustachian tube dysfunction refers to a condition in which a blockage develops in the narrow passage that connects the middle ear to the back of the nose (eustachian tube). The eustachian tube regulates air pressure in the middle ear by letting air move between the ear and nose. It also helps to drain fluid from the middle ear space. Eustachian tube dysfunction can affect one or both ears. When the eustachian tube does not function properly, air pressure, fluid, or both can build up  in the middle ear. What are the causes? This condition occurs when the eustachian tube becomes blocked or cannot open normally. Common causes of this condition include:  Ear infections.  Colds and other infections that affect the nose, mouth, and throat (upper respiratory tract).  Allergies.  Irritation from cigarette smoke.  Irritation from stomach acid coming up into the esophagus (gastroesophageal reflux). The esophagus is the tube that carries food from the mouth to the stomach.  Sudden changes in air pressure, such as from descending in an airplane or scuba diving.  Abnormal growths in the nose  or throat, such as: ? Growths that line the nose (nasal polyps). ? Abnormal growth of cells (tumors). ? Enlarged tissue at the back of the throat (adenoids). What increases the risk? You are more likely to develop this condition if:  You smoke.  You are overweight.  You are a child who has: ? Certain birth defects of the mouth, such as cleft palate. ? Large tonsils or adenoids. What are the signs or symptoms? Common symptoms of this condition include:  A feeling of fullness in the ear.  Ear pain.  Clicking or popping noises in the ear.  Ringing in the ear.  Hearing loss.  Loss of balance.  Dizziness. Symptoms may get worse when the air pressure around you changes, such as when you travel to an area of high elevation, fly on an airplane, or go scuba diving. How is this diagnosed? This condition may be diagnosed based on:  Your symptoms.  A physical exam of your ears, nose, and throat.  Tests, such as those that measure: ? The movement of your eardrum (tympanogram). ? Your hearing (audiometry). How is this treated? Treatment depends on the cause and severity of your condition.  In mild cases, you may relieve your symptoms by moving air into your ears. This is called "popping the ears."  In more severe cases, or if you have symptoms of fluid in your ears, treatment may include: ? Medicines to relieve congestion (decongestants). ? Medicines that treat allergies (antihistamines). ? Nasal sprays or ear drops that contain medicines that reduce swelling (steroids). ? A procedure to drain the fluid in your eardrum (myringotomy). In this procedure, a small tube is placed in the eardrum to:  Drain the fluid.  Restore the air in the middle ear space. ? A procedure to insert a balloon device through the nose to inflate the opening of the eustachian tube (balloon dilation). Follow these instructions at home: Lifestyle  Do not do any of the following until your health care  provider approves: ? Travel to high altitudes. ? Fly in airplanes. ? Work in a Pension scheme manager or room. ? Scuba dive.  Do not use any products that contain nicotine or tobacco, such as cigarettes and e-cigarettes. If you need help quitting, ask your health care provider.  Keep your ears dry. Wear fitted earplugs during showering and bathing. Dry your ears completely after. General instructions  Take over-the-counter and prescription medicines only as told by your health care provider.  Use techniques to help pop your ears as recommended by your health care provider. These may include: ? Chewing gum. ? Yawning. ? Frequent, forceful swallowing. ? Closing your mouth, holding your nose closed, and gently blowing as if you are trying to blow air out of your nose.  Keep all follow-up visits as told by your health care provider. This is important. Contact a health care provider if:  Your symptoms do not  go away after treatment.  Your symptoms come back after treatment.  You are unable to pop your ears.  You have: ? A fever. ? Pain in your ear. ? Pain in your head or neck. ? Fluid draining from your ear.  Your hearing suddenly changes.  You become very dizzy.  You lose your balance. Summary  Eustachian tube dysfunction refers to a condition in which a blockage develops in the eustachian tube.  It can be caused by ear infections, allergies, inhaled irritants, or abnormal growths in the nose or throat.  Symptoms include ear pain, hearing loss, or ringing in the ears.  Mild cases are treated with maneuvers to unblock the ears, such as yawning or ear popping.  Severe cases are treated with medicines. Surgery may also be done (rare). This information is not intended to replace advice given to you by your health care provider. Make sure you discuss any questions you have with your health care provider. Document Released: 01/14/2015 Document Revised: 04/09/2017 Document  Reviewed: 04/09/2017 Elsevier Patient Education  2020 [employer].

## 2018-07-17 NOTE — Progress Notes (Signed)
Patient ID: Benjamin [name], male    DOB: 17-Sep-1950, 68 y.o.   MRN: [id number]  PCP: Susy Frizzle, MD  Chief Complaint  Patient presents with  . B Ear Pain    x2 weeks- R>L- drainage in AM, ringing and HOH throughtout the day    Subjective:   Benjamin [name] is a 68 y.o. male, presents to clinic with CC of bilateral ear pain R>L.  Feels like there is draining, fullness that his hearing is decreased.  He does have history of ear infections, outer and inner ear infections he states as well as swimmer's ear.  He was recently treated with some eardrops and also oral antibiotics.  He does chronically clean his ears with Q-tips.  His ear canals itch him.  He denies any ringing in his ears, loss of balance, URI symptoms, fever, chills, sweats.    Patient Active Problem List   Diagnosis Date Noted  . OSA (obstructive sleep apnea) 11/02/2015  . Benign essential HTN 11/02/2015  . Obesity (BMI 30-39.9) 11/02/2015  . Chronic systolic heart failure ([place]) 05/19/2015  . CAD in native artery 05/19/2015  . ASCVD (arteriosclerotic cardiovascular disease)   . Allergy   . Myocardial infarction ([place]) 04/20/1999     Prior to Admission medications   Medication Sig Start Date End Date Taking? Authorizing Provider  aspirin 81 MG tablet Take 81 mg by mouth daily. Reported on 05/05/2015   Yes [provider]  atorvastatin (LIPITOR) 80 MG tablet TAKE ONE TABLET BY MOUTH DAILY AT 6 PM 05/21/18  Yes Susy Frizzle, MD  carvedilol (COREG) 12.5 MG tablet TAKE ONE TABLET BY MOUTH TWO TIMES A DAY 02/20/18  Yes Arnoldo Lenis, MD  Cholecalciferol (VITAMIN D) 2000 UNITS CAPS Take 1 capsule by mouth daily.    Yes [provider]  ENTRESTO 49-51 MG TAKE ONE TABLET BY MOUTH TWO TIMES A DAY 05/21/18  Yes Branch, Alphonse Guild, MD  levocetirizine (XYZAL) 5 MG tablet TAKE ONE TABLET BY MOUTH EVERY EVENING 07/11/18  Yes Susy Frizzle, MD     Allergies  Allergen Reactions  . Penicillins  Swelling     Family History  Problem Relation Age of Onset  . AAA (abdominal aortic aneurysm) Father      Social History   Socioeconomic History  . Marital status: Married    Spouse name: Not on file  . Number of children: Not on file  . Years of education: Not on file  . Highest education level: Not on file  Occupational History  . Not on file  Social Needs  . Financial resource strain: Not on file  . Food insecurity    Worry: Not on file    Inability: Not on file  . Transportation needs    Medical: Not on file    Non-medical: Not on file  Tobacco Use  . Smoking status: Former Smoker    Packs/day: 0.50    Years: 8.00    Pack years: 4.00    Types: Cigarettes    Start date: 07/24/1966    Quit date: 07/24/1974    Years since quitting: 44.0  . Smokeless tobacco: Never Used  Substance and Sexual Activity  . Alcohol use: No    Alcohol/week: 0.0 standard drinks  . Drug use: No  . Sexual activity: Yes    Birth control/protection: None  Lifestyle  . Physical activity    Days per week: Not on file    Minutes per session: Not  on file  . Stress: Not on file  Relationships  . Social Herbalist on phone: Not on file    Gets together: Not on file    Attends religious service: Not on file    Active member of club or organization: Not on file    Attends meetings of clubs or organizations: Not on file    Relationship status: Not on file  . Intimate partner violence    Fear of current or ex partner: Not on file    Emotionally abused: Not on file    Physically abused: Not on file    Forced sexual activity: Not on file  Other Topics Concern  . Not on file  Social History Narrative  . Not on file     Review of Systems  Constitutional: Negative.   HENT: Negative.   Eyes: Negative.   Respiratory: Negative.   Cardiovascular: Negative.   Gastrointestinal: Negative.   Endocrine: Negative.   Genitourinary: Negative.   Musculoskeletal: Negative.   Skin:  Negative.   Allergic/Immunologic: Negative.   Neurological: Negative.   Hematological: Negative.   Psychiatric/Behavioral: Negative.   All other systems reviewed and are negative.      Objective:    Vitals:   07/17/18 1114  BP: 128/66  Pulse: 64  Resp: 14  Temp: 98.4 F (36.9 C)  TempSrc: Oral  SpO2: 98%  Weight: 278 lb (126.1 kg)  Height: 5\' 10"  (1.778 m)      Physical Exam Vitals signs and nursing note reviewed.  Constitutional:      General: He is not in acute distress.    Appearance: Normal appearance. He is well-developed. He is not toxic-appearing or diaphoretic.  HENT:     Head: Normocephalic and atraumatic.     Jaw: No trismus.     Right Ear: No decreased hearing noted. Swelling present. No laceration or drainage. No middle ear effusion. No mastoid tenderness.     Left Ear: External ear normal. No decreased hearing noted.     Ears:     Comments: Grossly normal hearing bilaterally Right external auditory canal and right external ear with chronic appearing edema and erythema with flaking of the skin, canal swollen very tight, unable to visualize tympanic membrane.  Right external ear no tenderness to palpation of tragus or with manipulation of pinna Left ear external fairly normal-appearing, left external auditory canal mildly edematous and erythematous, also cannot visualize tympanic membrane Bilateral ears the deepest point visualized canal with thin appearing white debris appears flaky, no landmarks visualized of TM bilaterally No pre-auricular or postauricular lymphadenopathy    Nose: Mucosal edema and rhinorrhea present.     Right Sinus: No maxillary sinus tenderness or frontal sinus tenderness.     Left Sinus: No maxillary sinus tenderness or frontal sinus tenderness.     Mouth/Throat:     Mouth: Mucous membranes are not pale, not dry and not cyanotic.     Pharynx: Uvula midline. Posterior oropharyngeal erythema present. No oropharyngeal exudate or uvula  swelling.     Tonsils: No tonsillar exudate or tonsillar abscesses.  Eyes:     General: Lids are normal.        Right eye: No discharge.        Left eye: No discharge.     Conjunctiva/sclera: Conjunctivae normal.     Pupils: Pupils are equal, round, and reactive to light.  Neck:     Musculoskeletal: Normal range of motion and neck supple.  Trachea: Trachea and phonation normal. No tracheal deviation.  Cardiovascular:     Rate and Rhythm: Normal rate and regular rhythm.     Pulses:          Radial pulses are 2+ on the right side and 2+ on the left side.     Heart sounds: Normal heart sounds. No murmur. No friction rub. No gallop.   Pulmonary:     Effort: Pulmonary effort is normal. No tachypnea, accessory muscle usage or respiratory distress.     Breath sounds: Normal breath sounds. No stridor. No decreased breath sounds, wheezing, rhonchi or rales.  Abdominal:     General: Bowel sounds are normal. There is no distension.     Palpations: Abdomen is soft.     Tenderness: There is no abdominal tenderness.  Musculoskeletal: Normal range of motion.  Skin:    General: Skin is warm and dry.     Capillary Refill: Capillary refill takes less than 2 seconds.     Coloration: Skin is not pale.     Findings: No rash.     Nails: There is no clubbing.   Neurological:     Mental Status: He is alert and oriented to person, place, and time.     Motor: No abnormal muscle tone.     Coordination: Coordination normal.     Gait: Gait normal.  Psychiatric:        Speech: Speech normal.        Behavior: Behavior normal. Behavior is cooperative.           Assessment & Plan:      ICD-10-CM   1. Chronic otitis externa of both ears, unspecified type  H60.63 ciprofloxacin-dexamethasone (CIPRODEX) OTIC suspension  2. Rhinosinusitis  J32.9 fluticasone (FLONASE) 50 MCG/ACT nasal spray    Both ears were gently irrigated with warm water and hydrogen peroxide, patient tolerated without any pain or  vertigo, ears reexamined, still unable to visualize tympanic membrane bilaterally.  Since there was no pain feel is highly unlikely for tympanic membrane to be ruptured but will treat again with Ciprodex for 3 to 5 days, patient to start antihistamines, and follow-up in clinic for recheck to visualize tympanic membrane.  He was also instructed not to use Q-tips in his ears.  Once tympanic membrane's are visualized and intact would like to treat patient with Cortisporin drops for more chronic otitis externa.   He was also instructed to treat sinusitis with nasal steroid spray and antihistamines as I do suspect that some of his hearing symptoms are eustachian tube dysfunction.  If any persistent decreased hearing symptoms will test in office or refer to ENT based off patient preference   Delsa Grana, PA-C 07/17/18 11:52 AM

## 2018-07-29 ENCOUNTER — Ambulatory Visit (INDEPENDENT_AMBULATORY_CARE_PROVIDER_SITE_OTHER): Payer: BC Managed Care – PPO | Admitting: Family Medicine

## 2018-07-29 ENCOUNTER — Encounter: Payer: Self-pay | Admitting: Family Medicine

## 2018-07-29 ENCOUNTER — Other Ambulatory Visit: Payer: Self-pay

## 2018-07-29 VITALS — BP 100/62 | HR 48 | Temp 98.8°F | Resp 16 | Ht 70.0 in | Wt 276.2 lb

## 2018-07-29 DIAGNOSIS — H6063 Unspecified chronic otitis externa, bilateral: Secondary | ICD-10-CM | POA: Diagnosis not present

## 2018-07-29 DIAGNOSIS — H68003 Unspecified Eustachian salpingitis, bilateral: Secondary | ICD-10-CM

## 2018-07-29 DIAGNOSIS — H9193 Unspecified hearing loss, bilateral: Secondary | ICD-10-CM

## 2018-07-29 DIAGNOSIS — J329 Chronic sinusitis, unspecified: Secondary | ICD-10-CM

## 2018-07-29 MED ORDER — NEOMYCIN-POLYMYXIN-HC 3.5-10000-1 OT SOLN: 4.0000 [drp] | Freq: Four times a day (QID) | OTIC | 1 refills | Status: AC

## 2018-07-29 NOTE — Progress Notes (Signed)
Patient ID: Benjamin [name], male    DOB: 1950/03/17, 68 y.o.   MRN: [id number]  PCP: Susy Frizzle, MD  Chief Complaint  Patient presents with  . Ear Pain    Patient in today for follow up on right ear pain    Subjective:   07/29/18 Benjamin [name] is a 68 y.o. male here for follow up on b/l ear pain and decreased hearing.  He has treated with ciprodex, flonase and allergy meds, it is feeling much better, decrease in pain and itching.  He still have some intermittent popping and fullness with decreased hearing in both ears, but overall hearing and sx better.  No SE of ear drops.  He has tried to not itch or use q-tips in ears.   07/17/2018: Benjamin [name] is a 68 y.o. male, presents to clinic with CC of bilateral ear pain R>L.  Feels like there is draining, fullness that his hearing is decreased.  He does have history of ear infections, outer and inner ear infections he states as well as swimmer's ear.  He was recently treated with some eardrops and also oral antibiotics.  He does chronically clean his ears with Q-tips.  His ear canals itch him.  He denies any ringing in his ears, loss of balance, URI symptoms, fever, chills, sweats.    Patient Active Problem List   Diagnosis Date Noted  . OSA (obstructive sleep apnea) 11/02/2015  . Benign essential HTN 11/02/2015  . Obesity (BMI 30-39.9) 11/02/2015  . Chronic systolic heart failure ([place]) 05/19/2015  . CAD in native artery 05/19/2015  . ASCVD (arteriosclerotic cardiovascular disease)   . Allergy   . Myocardial infarction ([place]) 04/20/1999     Prior to Admission medications   Medication Sig Start Date End Date Taking? Authorizing Provider  aspirin 81 MG tablet Take 81 mg by mouth daily. Reported on 05/05/2015   Yes [provider]  atorvastatin (LIPITOR) 80 MG tablet TAKE ONE TABLET BY MOUTH DAILY AT 6 PM 05/21/18  Yes Susy Frizzle, MD  carvedilol (COREG) 12.5 MG tablet TAKE ONE TABLET BY MOUTH TWO TIMES A DAY  02/20/18  Yes Arnoldo Lenis, MD  Cholecalciferol (VITAMIN D) 2000 UNITS CAPS Take 1 capsule by mouth daily.    Yes [provider]  ENTRESTO 49-51 MG TAKE ONE TABLET BY MOUTH TWO TIMES A DAY 05/21/18  Yes Branch, Alphonse Guild, MD  levocetirizine (XYZAL) 5 MG tablet TAKE ONE TABLET BY MOUTH EVERY EVENING 07/11/18  Yes Susy Frizzle, MD     Allergies  Allergen Reactions  . Penicillins Swelling     Family History  Problem Relation Age of Onset  . AAA (abdominal aortic aneurysm) Father      Social History   Socioeconomic History  . Marital status: Married    Spouse name: Not on file  . Number of children: Not on file  . Years of education: Not on file  . Highest education level: Not on file  Occupational History  . Not on file  Social Needs  . Financial resource strain: Not on file  . Food insecurity    Worry: Not on file    Inability: Not on file  . Transportation needs    Medical: Not on file    Non-medical: Not on file  Tobacco Use  . Smoking status: Former Smoker    Packs/day: 0.50    Years: 8.00    Pack years: 4.00    Types: Cigarettes  Start date: 07/24/1966    Quit date: 07/24/1974    Years since quitting: 44.0  . Smokeless tobacco: Never Used  Substance and Sexual Activity  . Alcohol use: No    Alcohol/week: 0.0 standard drinks  . Drug use: No  . Sexual activity: Yes    Birth control/protection: None  Lifestyle  . Physical activity    Days per week: Not on file    Minutes per session: Not on file  . Stress: Not on file  Relationships  . Social Herbalist on phone: Not on file    Gets together: Not on file    Attends religious service: Not on file    Active member of club or organization: Not on file    Attends meetings of clubs or organizations: Not on file    Relationship status: Not on file  . Intimate partner violence    Fear of current or ex partner: Not on file    Emotionally abused: Not on file    Physically abused:  Not on file    Forced sexual activity: Not on file  Other Topics Concern  . Not on file  Social History Narrative  . Not on file     Review of Systems  Constitutional: Negative.   HENT: Negative.   Eyes: Negative.   Respiratory: Negative.   Cardiovascular: Negative.   Gastrointestinal: Negative.   Endocrine: Negative.   Genitourinary: Negative.   Musculoskeletal: Negative.   Skin: Negative.   Allergic/Immunologic: Negative.   Neurological: Negative.   Hematological: Negative.   Psychiatric/Behavioral: Negative.   All other systems reviewed and are negative.      Objective:    Vitals:   07/29/18 0856  BP: 100/62  Pulse: (!) 48  Resp: 16  Temp: 98.8 F (37.1 C)  TempSrc: Oral  SpO2: 98%  Weight: 276 lb 4 oz (125.3 kg)  Height: 5\' 10"  (1.778 m)      Physical Exam Vitals signs and nursing note reviewed.  Constitutional:      General: He is not in acute distress.    Appearance: Normal appearance. He is well-developed. He is not toxic-appearing or diaphoretic.  HENT:     Head: Normocephalic and atraumatic.     Jaw: No trismus.     Right Ear: Ear canal and external ear normal. No drainage, swelling or tenderness. No middle ear effusion. There is no impacted cerumen. No mastoid tenderness.     Left Ear: Ear canal and external ear normal. No drainage, swelling or tenderness.  No middle ear effusion. There is no impacted cerumen. No mastoid tenderness.     Nose: Mucosal edema and congestion present. No rhinorrhea.     Right Turbinates: Enlarged and swollen.     Left Turbinates: Enlarged and swollen.     Right Sinus: No maxillary sinus tenderness or frontal sinus tenderness.     Left Sinus: No maxillary sinus tenderness or frontal sinus tenderness.     Mouth/Throat:     Mouth: Mucous membranes are moist. Mucous membranes are not pale, not dry and not cyanotic.     Pharynx: Oropharynx is clear. Uvula midline. No oropharyngeal exudate, posterior oropharyngeal erythema  or uvula swelling.     Tonsils: No tonsillar exudate or tonsillar abscesses.  Eyes:     General: Lids are normal.        Right eye: No discharge.        Left eye: No discharge.     Conjunctiva/sclera: Conjunctivae  normal.     Pupils: Pupils are equal, round, and reactive to light.  Neck:     Musculoskeletal: Normal range of motion and neck supple.     Trachea: Trachea and phonation normal. No tracheal deviation.  Cardiovascular:     Rate and Rhythm: Regular rhythm. Bradycardia present.     Chest Wall: PMI is not displaced.     Pulses: No decreased pulses.          Radial pulses are 2+ on the right side and 2+ on the left side.     Heart sounds: Normal heart sounds. Heart sounds not distant. No murmur. No friction rub. No gallop.   Pulmonary:     Effort: Pulmonary effort is normal. No tachypnea, accessory muscle usage or respiratory distress.     Breath sounds: Normal breath sounds. No stridor. No decreased breath sounds, wheezing, rhonchi or rales.  Abdominal:     General: Bowel sounds are normal. There is no distension.     Palpations: Abdomen is soft.     Tenderness: There is no abdominal tenderness.  Musculoskeletal: Normal range of motion.  Skin:    General: Skin is warm and dry.     Capillary Refill: Capillary refill takes less than 2 seconds.     Coloration: Skin is not pale.     Findings: No rash.     Nails: There is no clubbing.   Neurological:     Mental Status: He is alert.     Motor: No abnormal muscle tone.     Coordination: Coordination normal.     Gait: Gait normal.  Psychiatric:        Speech: Speech normal.        Behavior: Behavior normal. Behavior is cooperative.           Assessment & Plan:      ICD-10-CM   1. Chronic otitis externa of both ears, unspecified type  H60.63 Ambulatory referral to ENT  2. Rhinosinusitis  J32.9 Ambulatory referral to ENT  3. Catarrh of both eustachian tubes  H68.003 Ambulatory referral to ENT  4. Decreased hearing of  both ears  H91.93 Ambulatory referral to ENT    Both ears appear improving from last visit, still some thin wax, not impacted, TM's still difficult to fully visualize, but visible portions appear intact.  Tx recurrent or chronic otitis externa PRN with cortisporin drops and OTC ear wax removal/irrigation such as debrox with gentle irrigation, or return to clinic PRN  Nasal mucosa and turbinates are less erythematous, but still severely edematous. If still having sx with hearing in the next 1-2 weeks recommend going to ENT and referral put in.  Still believe the fluid feeling, popping and hearing sx are eustachian tube dysfunction and will likely resolve in another 1-2 weeks.  The nurse asked pt if he was feeling well or not and noted low heart rate and low BP - rechecked his HR in room, 52 - 56, BP is WNL, similar to past BP per review of flow sheets - encouraged him to cut coreg dose in half if HR is below 50 AND he is sx (lightheadedness, fatigue) and call and notify and follow up with cardiology.     Delsa Grana, PA-C 07/29/18 9:06 AM

## 2018-07-29 NOTE — Patient Instructions (Addendum)
Continue using flonase and xyzal to help with swelling and allergies - should gradually help nasal and ear symptoms.  Use the cortisporin drops for few days at a time (3-7) as needed for outer ear pain, swelling, irritation.  Can be used for more chronic outer ear problems.  Follow up with ENT for further evaluation of your hearing, ears and nasal mucosa.    You can try cutting your Coreg in half and can check your heart rate and blood pressure at home. Would only temporarily cut the dose in half if you are having symptoms of low heart rate.  I would follow up with the prescribing cardiologist.

## 2018-08-19 ENCOUNTER — Other Ambulatory Visit: Payer: Self-pay | Admitting: Family Medicine

## 2018-08-25 ENCOUNTER — Encounter: Payer: Self-pay | Admitting: Family Medicine

## 2018-09-12 ENCOUNTER — Encounter: Payer: Self-pay | Admitting: Family Medicine

## 2018-09-12 ENCOUNTER — Ambulatory Visit (INDEPENDENT_AMBULATORY_CARE_PROVIDER_SITE_OTHER): Payer: BC Managed Care – PPO | Admitting: Family Medicine

## 2018-09-12 VITALS — BP 110/68 | HR 56 | Temp 98.8°F | Resp 18 | Ht 70.0 in | Wt 270.0 lb

## 2018-09-12 DIAGNOSIS — G609 Hereditary and idiopathic neuropathy, unspecified: Secondary | ICD-10-CM

## 2018-09-12 MED ORDER — GABAPENTIN 300 MG PO CAPS: 300.0000 mg | ORAL_CAPSULE | Freq: Three times a day (TID) | ORAL | 3 refills | Status: DC | PRN

## 2018-09-12 NOTE — Progress Notes (Signed)
Subjective:    Patient ID: Benjamin [name], male    DOB: 02-Jun-1950, 68 y.o.   MRN: [id number]  HPI Patient reports the gradual onset of numbness and tingling in both feet.  Tingling encompasses the plantar aspects of both feet and also the dorsums of both mid feet.  He also reports pins-and-needles and stinging pain.  At time his feet feel numb.  At other times the skin feels thick and stiff.  At other times the toes will burn and sting.  Diabetic foot exam was performed today and shows normal sensation to 10 g monofilament bilaterally.  Patient has normal pulses.  There are no wounds or abrasions on the feet.  Patient also asked me to examine his ears today.  Patient has a mild cerumen accumulation in his right ear with an approximate 50% obstruction.  He has an approximate 50% obstruction in his left auditory canal as well.  I recommended using Debrox as needed to help clear this. Past Medical History:  Diagnosis Date  . Allergy   . ASCVD (arteriosclerotic cardiovascular disease)   . Hyperlipidemia   . Hypertension   . Myocardial infarction ([place]) 04/20/1999  . Obesity (BMI 30-39.9)   . OSA (obstructive sleep apnea) 11/02/2015   Past Surgical History:  Procedure Laterality Date  . CARDIAC CATHETERIZATION N/A 05/19/2015   Procedure: Right/Left Heart Cath and Coronary Angiography;  Surgeon: Belva Crome, MD;  Location: [facility];  Service: Cardiovascular;  Laterality: N/A;  . CORONARY STENT PLACEMENT  04/18/1999   Current Outpatient Medications on File Prior to Visit  Medication Sig Dispense Refill  . aspirin 81 MG tablet Take 81 mg by mouth daily. Reported on 05/05/2015    . atorvastatin (LIPITOR) 80 MG tablet TAKE 1 TABLET BY MOUTH DAILY AT 6PM 90 tablet 0  . carvedilol (COREG) 12.5 MG tablet TAKE ONE TABLET BY MOUTH TWO TIMES A DAY 180 tablet 2  . Cholecalciferol (VITAMIN D) 2000 UNITS CAPS Take 1 capsule by mouth daily.     Marland Kitchen ENTRESTO 49-51 MG TAKE ONE TABLET BY MOUTH TWO TIMES A  DAY 180 tablet 3  . fluticasone (FLONASE) 50 MCG/ACT nasal spray Place 2 sprays into both nostrils daily. 16 g 2  . levocetirizine (XYZAL) 5 MG tablet TAKE ONE TABLET BY MOUTH EVERY EVENING 90 tablet 1   No current facility-administered medications on file prior to visit.    Allergies  Allergen Reactions  . Penicillins Swelling   Social History   Socioeconomic History  . Marital status: Married    Spouse name: Not on file  . Number of children: Not on file  . Years of education: Not on file  . Highest education level: Not on file  Occupational History  . Not on file  Social Needs  . Financial resource strain: Not on file  . Food insecurity    Worry: Not on file    Inability: Not on file  . Transportation needs    Medical: Not on file    Non-medical: Not on file  Tobacco Use  . Smoking status: Former Smoker    Packs/day: 0.50    Years: 8.00    Pack years: 4.00    Types: Cigarettes    Start date: 07/24/1966    Quit date: 07/24/1974    Years since quitting: 44.1  . Smokeless tobacco: Never Used  Substance and Sexual Activity  . Alcohol use: No    Alcohol/week: 0.0 standard drinks  . Drug use: No  .  Sexual activity: Yes    Birth control/protection: None  Lifestyle  . Physical activity    Days per week: Not on file    Minutes per session: Not on file  . Stress: Not on file  Relationships  . Social Herbalist on phone: Not on file    Gets together: Not on file    Attends religious service: Not on file    Active member of club or organization: Not on file    Attends meetings of clubs or organizations: Not on file    Relationship status: Not on file  . Intimate partner violence    Fear of current or ex partner: Not on file    Emotionally abused: Not on file    Physically abused: Not on file    Forced sexual activity: Not on file  Other Topics Concern  . Not on file  Social History Narrative  . Not on file      Review of Systems  All other systems  reviewed and are negative.      Objective:   Physical Exam Vitals signs reviewed.  Constitutional:      Appearance: He is obese.  Cardiovascular:     Rate and Rhythm: Normal rate and regular rhythm.  Pulmonary:     Effort: Pulmonary effort is normal.     Breath sounds: Normal breath sounds.  Musculoskeletal:     Right foot: Normal range of motion and normal capillary refill. No tenderness, bony tenderness, swelling or crepitus.     Left foot: Normal range of motion and normal capillary refill. No bony tenderness, swelling or crepitus.  Neurological:     Mental Status: He is alert.           Assessment & Plan:  Idiopathic peripheral neuropathy - Plan: Vitamin B12, COMPLETE METABOLIC PANEL WITH GFR, TSH, Hemoglobin A1c  Patient appears to be developing idiopathic peripheral neuropathy.  I assume this is due to diabetes.  Check vitamin B12 level to rule out B12 deficiency.  Check TSH to rule out hypothyroidism.  Check hemoglobin A1c to ensure adequate management and control of his blood sugars.  Check a CMP to evaluate for any electrolyte disturbances.  Meanwhile he can use gabapentin 300 mg every 8 hours as needed for nerve pain.

## 2018-09-13 LAB — COMPLETE METABOLIC PANEL WITH GFR
AG Ratio: 2.1 (calc) (ref 1.0–2.5)
ALT: 19 U/L (ref 9–46)
AST: 16 U/L (ref 10–35)
Albumin: 4.2 g/dL (ref 3.6–5.1)
Alkaline phosphatase (APISO): 105 U/L (ref 35–144)
BUN: 15 mg/dL (ref 7–25)
CO2: 26 mmol/L (ref 20–32)
Calcium: 8.9 mg/dL (ref 8.6–10.3)
Chloride: 106 mmol/L (ref 98–110)
Creat: 1.12 mg/dL (ref 0.70–1.25)
GFR, Est African American: 77 mL/min/{1.73_m2} (ref >=60)
GFR, Est Non African American: 67 mL/min/{1.73_m2} (ref >=60)
Globulin: 2 g/dL (calc) (ref 1.9–3.7)
Glucose, Bld: 103 mg/dL — ABNORMAL HIGH (ref 65–99)
Potassium: 4.9 mmol/L (ref 3.5–5.3)
Sodium: 142 mmol/L (ref 135–146)
Total Bilirubin: 0.8 mg/dL (ref 0.2–1.2)
Total Protein: 6.2 g/dL (ref 6.1–8.1)

## 2018-09-13 LAB — HEMOGLOBIN A1C
Hgb A1c MFr Bld: 6.1 % of total Hgb — ABNORMAL HIGH (ref <5.7)
Mean Plasma Glucose: 128 (calc)
eAG (mmol/L): 7.1 (calc)

## 2018-09-13 LAB — TSH: TSH: 1.39 mIU/L (ref 0.40–4.50)

## 2018-09-13 LAB — VITAMIN B12: Vitamin B-12: 259 pg/mL (ref 200–1100)

## 2018-10-10 ENCOUNTER — Other Ambulatory Visit: Payer: Self-pay

## 2018-10-10 DIAGNOSIS — Z20822 Contact with and (suspected) exposure to covid-19: Secondary | ICD-10-CM

## 2018-10-12 LAB — NOVEL CORONAVIRUS, NAA: SARS-CoV-2, NAA: NOT DETECTED

## 2018-10-21 DIAGNOSIS — H6123 Impacted cerumen, bilateral: Secondary | ICD-10-CM | POA: Diagnosis not present

## 2018-10-21 DIAGNOSIS — H9011 Conductive hearing loss, unilateral, right ear, with unrestricted hearing on the contralateral side: Secondary | ICD-10-CM | POA: Diagnosis not present

## 2018-10-28 ENCOUNTER — Other Ambulatory Visit: Payer: Self-pay | Admitting: Otolaryngology

## 2018-10-28 DIAGNOSIS — H902 Conductive hearing loss, unspecified: Secondary | ICD-10-CM

## 2018-10-30 ENCOUNTER — Other Ambulatory Visit: Payer: BC Managed Care – PPO

## 2018-11-03 ENCOUNTER — Ambulatory Visit
Admission: RE | Admit: 2018-11-03 | Discharge: 2018-11-03 | Disposition: A | Discharge status: Home / Self Care | Payer: BC Managed Care – PPO | Source: Ambulatory Visit | Attending: Otolaryngology | Admitting: Otolaryngology

## 2018-11-03 DIAGNOSIS — H9011 Conductive hearing loss, unilateral, right ear, with unrestricted hearing on the contralateral side: Secondary | ICD-10-CM | POA: Diagnosis not present

## 2018-11-03 DIAGNOSIS — H902 Conductive hearing loss, unspecified: Secondary | ICD-10-CM

## 2018-11-07 ENCOUNTER — Encounter: Payer: Self-pay | Admitting: Cardiology

## 2018-11-07 ENCOUNTER — Other Ambulatory Visit: Payer: Self-pay

## 2018-11-07 ENCOUNTER — Ambulatory Visit (INDEPENDENT_AMBULATORY_CARE_PROVIDER_SITE_OTHER): Payer: BC Managed Care – PPO | Admitting: Cardiology

## 2018-11-07 VITALS — BP 99/62 | HR 55 | Ht 70.0 in | Wt 280.0 lb

## 2018-11-07 DIAGNOSIS — I2510 Atherosclerotic heart disease of native coronary artery without angina pectoris: Secondary | ICD-10-CM | POA: Diagnosis not present

## 2018-11-07 DIAGNOSIS — E782 Mixed hyperlipidemia: Secondary | ICD-10-CM | POA: Diagnosis not present

## 2018-11-07 DIAGNOSIS — I5022 Chronic systolic (congestive) heart failure: Secondary | ICD-10-CM

## 2018-11-07 MED ORDER — CARVEDILOL 3.125 MG PO TABS: 3.1250 mg | ORAL_TABLET | Freq: Two times a day (BID) | ORAL | 1 refills | Status: DC

## 2018-11-07 NOTE — Progress Notes (Signed)
Clinical Summary Benjamin [name] is a 68 y.o.male seen today for follow up of the following medical problems.     1. CAD - previous notes mention MI in 2001 at [facility], cannot find cath report. Patient has stent card that shows he received a stent to his LAD at that time.  - repeat cath 05/2015 that showed patent coronaries. This was down after he was found to have a drop in LVEF    - no recent chest pain - compliant with meds.      2. Chronic systolic heart failure/NICM - 05/2015 echo showed an LVEF 25-30%, this is a new finding of systolic dysfunction. - 05/2015 cath showed patent coronaries. Normal filling pressures at that time.  - repeatecho 9/217, LVEF improved to 45-50%.Grade I diasotlic dysfunction.   - no recent edema - compliant with meds   3. OSA - followed by Dr Radford Pax - compliant with cpap.   4. HL -04/2017 lipids at goal, compliant with statin.   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    Past Medical History:  Diagnosis Date  . Allergy   . ASCVD (arteriosclerotic cardiovascular disease)   . Hyperlipidemia   . Hypertension   . Myocardial infarction ([place]) 04/20/1999  . Obesity (BMI 30-39.9)   . OSA (obstructive sleep apnea) 11/02/2015     Allergies  Allergen Reactions  . Penicillins Swelling     Current Outpatient Medications  Medication Sig Dispense Refill  . aspirin 81 MG tablet Take 81 mg by mouth daily. Reported on 05/05/2015    . atorvastatin (LIPITOR) 80 MG tablet TAKE 1 TABLET BY MOUTH DAILY AT 6PM 90 tablet 0  . carvedilol (COREG) 12.5 MG tablet TAKE ONE TABLET BY MOUTH TWO TIMES A DAY 180 tablet 2  . Cholecalciferol (VITAMIN D) 2000 UNITS CAPS Take 1 capsule by mouth daily.     Marland Kitchen ENTRESTO 49-51 MG TAKE ONE TABLET BY MOUTH TWO TIMES A DAY 180 tablet 3  . fluticasone (FLONASE) 50 MCG/ACT nasal spray Place 2 sprays into both nostrils daily. 16 g 2  . gabapentin (NEURONTIN)  300 MG capsule Take 1 capsule (300 mg total) by mouth 3 (three) times daily as needed (NERVE PAIN). 90 capsule 3  . levocetirizine (XYZAL) 5 MG tablet TAKE ONE TABLET BY MOUTH EVERY EVENING 90 tablet 1   No current facility-administered medications for this visit.      Past Surgical History:  Procedure Laterality Date  . CARDIAC CATHETERIZATION N/A 05/19/2015   Procedure: Right/Left Heart Cath and Coronary Angiography;  Surgeon: Belva Crome, MD;  Location: [facility] [facility];  Service: Cardiovascular;  Laterality: N/A;  . CORONARY STENT PLACEMENT  04/18/1999     Allergies  Allergen Reactions  . Penicillins Swelling      Family History  Problem Relation Age of Onset  . AAA (abdominal aortic aneurysm) Father      Social History Mr. Vivenzio reports that he quit smoking about 44 years ago. His smoking use included cigarettes. He started smoking about 52 years ago. He has a 4.00 pack-year smoking history. He has never used smokeless tobacco. Mr. Gago reports no history of alcohol use.   Review of Systems CONSTITUTIONAL: No weight loss, fever, chills, weakness or fatigue.  HEENT: Eyes: No visual loss, blurred vision, double vision or yellow sclerae.No hearing loss, sneezing, congestion, runny nose or sore throat.  SKIN: No rash or itching.  CARDIOVASCULAR: per hpi RESPIRATORY: No shortness of breath, cough or sputum.  GASTROINTESTINAL: No anorexia, nausea, vomiting or diarrhea. No abdominal pain or blood.  GENITOURINARY: No burning on urination, no polyuria NEUROLOGICAL: No headache, dizziness, syncope, paralysis, ataxia, numbness or tingling in the extremities. No change in bowel or bladder control.  MUSCULOSKELETAL: No muscle, back pain, joint pain or stiffness.  LYMPHATICS: No enlarged nodes. No history of splenectomy.  PSYCHIATRIC: No history of depression or anxiety.  ENDOCRINOLOGIC: No reports of sweating, cold or heat intolerance. No polyuria or polydipsia.  Marland Kitchen    Physical Examination Today's Vitals   11/07/18 1544  BP: 99/62  Pulse: (!) 55  SpO2: 99%  Weight: 280 lb (127 kg)  Height: 5\' 10"  (1.778 m)   Body mass index is 40.18 kg/m.  Gen: resting comfortably, no acute distress HEENT: no scleral icterus, pupils equal round and reactive, no palptable cervical adenopathy,  CV: RRR, no m/r/g, no jvd Resp: Clear to auscultation bilaterally GI: abdomen is soft, non-tender, non-distended, normal bowel sounds, no hepatosplenomegaly MSK: extremities are warm, no edema.  Skin: warm, no rash Neuro:  no focal deficits Psych: appropriate affect   Diagnostic Studies  05/2015 echo Study Conclusions  - Left ventricle: The cavity size was normal. Wall thickness was normal. Systolic function was severely reduced. The estimated ejection fraction was in the range of 25% to 30%. Diffuse hypokinesis. Doppler parameters are consistent with abnormal left ventricular relaxation (grade 1 diastolic dysfunction). - Regional wall motion abnormality: Akinesis of the mid anterior, basal-mid anteroseptal, and apical myocardium. - Aortic valve: Mildly calcified annulus. Trileaflet; mildly thickened leaflets. Valve area (VTI): 2.42 cm^2. Valve area (Vmax): 2.32 cm^2. - Atrial septum: No defect or patent foramen ovale was identified. - Technically difficult study. Echocontrast was used to enhance visualization.     05/2015 cath 1. Prox RCA to Mid RCA lesion, 20% stenosed. 2. Ost LAD to Prox LAD lesion, 30% stenosed. The lesion was previously treated with a stent (unknown type). 3. Dist Cx lesion, 50% stenosed.   Severe anterior apical hypokinesis. Estimated ejection fraction 35%. Normal left ventricular hemodynamics with EDP of 14 mmHg and mean capillary wedge 17 mmHg.  Widely patent coronary arteries with 30% diffuse ISR in the proximal LAD stent. There is 50% eccentric stenosis in the distal circumflex. Luminal irregularities are  noted in the right coronary.  Post cath RECOMMENDATIONS:   Aggressive risk factor modification to prevent progression of CAD  Aggressive heart failure therapy to minimize progression of LV dysfunction.   09/2015 echo Study Conclusions  - Left ventricle: The cavity size was normal. Wall thickness was normal. Systolic function was mildly reduced. The estimated ejection fraction was in the range of 45% to 50%. Doppler parameters are consistent with abnormal left ventricular relaxation (grade 1 diastolic dysfunction). - Regional wall motion abnormality: Hypokinesis of the mid anterior, mid anteroseptal, and apical myocardium. - Aortic valve: Mildly calcified annulus. Trileaflet; mildly thickened leaflets. Valve area (VTI): 3.07 cm^2. Valve area (Vmax): 2.7 cm^2. Valve area (Vmean): 2.7 cm^2. - Technically difficult study.   Assessment and Plan  1. CAD - history of stent to LAD in 2001 - repeat cath in setting of newly diagnosed systolic heart failure shows patent coronaries --no symptoms. Some low bp's and mild bradycardia, we will lower coreg to 3.125mg  bid - EKG shows SR, no acute sichemic changes   2. Chronic systolic heart failure/NICM -LVEF improved with medical therapy. - doing well without symptoms, continue current meds   3.  Hyperlipidemia - he is at goal, continue statin  4. OSA - f/u with Dr Radford Pax  F/u 1 year   Arnoldo Lenis, M.D.

## 2018-11-07 NOTE — Patient Instructions (Signed)
Your physician wants you to follow-up in: [place] will receive a reminder letter in the mail two months in advance. If you don't receive a letter, please call our office to schedule the follow-up appointment.  Your physician has recommended you make the following change in your medication:   WE WILL CONTACT DR Landis Gandy OFFICE TO SCHEDULE YOU A FOLLOW UP  DECREASE COREG 3.125 MG TWICE DAILY   Thank you for choosing [facility]!!

## 2018-11-18 ENCOUNTER — Other Ambulatory Visit: Payer: Self-pay | Admitting: Family Medicine

## 2018-11-18 ENCOUNTER — Other Ambulatory Visit: Payer: Self-pay | Admitting: Cardiology

## 2018-11-19 DIAGNOSIS — H9011 Conductive hearing loss, unilateral, right ear, with unrestricted hearing on the contralateral side: Secondary | ICD-10-CM | POA: Diagnosis not present

## 2018-11-19 DIAGNOSIS — H6123 Impacted cerumen, bilateral: Secondary | ICD-10-CM | POA: Diagnosis not present

## 2018-11-20 ENCOUNTER — Telehealth: Payer: Self-pay | Admitting: *Deleted

## 2018-11-20 NOTE — Telephone Encounter (Signed)
received notice from [facility] that pt was approved for Entresto through 11/19/2019

## 2018-12-29 ENCOUNTER — Telehealth: Payer: Self-pay | Admitting: *Deleted

## 2018-12-29 NOTE — Telephone Encounter (Signed)

## 2019-01-04 ENCOUNTER — Other Ambulatory Visit: Payer: Self-pay | Admitting: Family Medicine

## 2019-01-11 NOTE — Progress Notes (Signed)
Cardiology Office Note:    Date:  01/12/2019   ID:  [name], DOB 09/27/1950, MRN [id number]  PCP:  Susy Frizzle, MD  Cardiologist:  Fransico Him, MD    Referring MD: Susy Frizzle, MD   Chief Complaint  Patient presents with  . Sleep Apnea  . Hypertension    History of Present Illness:    Benjamin [name] is a 69 y.o. male with a hx of HTN, hyperlipidemia, CAD with MI and OSA on CPAP. He was diagnosed with OSA 10 years ago and was on CPAP for 6-8 months and stopped it due to discomfort with the mask. He subsequently restarted the device and was seen by me.  I repeated a sleep study and he has severe OSA with an AHI of 48/hr with oxygen desaturations as low as 78%.  He is now on CPAP at 16cm H2O.    He is doing well with his CPAP device and thinks that he has gotten used to it.  He tolerates the mask and feels the pressure is adequate.  Since going on CPAP he feels rested in the am and has no significant daytime sleepiness.  He denies any significant mouth or nasal dryness or nasal congestion.  He does not think that he snores.     Past Medical History:  Diagnosis Date  . Allergy   . ASCVD (arteriosclerotic cardiovascular disease)   . Hyperlipidemia   . Hypertension   . Myocardial infarction ([place]) 04/20/1999  . Obesity (BMI 30-39.9)   . OSA (obstructive sleep apnea) 11/02/2015    Past Surgical History:  Procedure Laterality Date  . CARDIAC CATHETERIZATION N/A 05/19/2015   Procedure: Right/Left Heart Cath and Coronary Angiography;  Surgeon: Belva Crome, MD;  Location: [facility];  Service: Cardiovascular;  Laterality: N/A;  . CORONARY STENT PLACEMENT  04/18/1999    Current Medications: Current Meds  Medication Sig  . aspirin 81 MG tablet Take 81 mg by mouth daily. Reported on 05/05/2015  . atorvastatin (LIPITOR) 80 MG tablet TAKE ONE TABLET BY MOUTH DAILY AT 6P IN THE EVENING  . carvedilol (COREG) 3.125 MG tablet Take 1 tablet (3.125 mg total) by mouth 2  (two) times daily.  . Cholecalciferol (VITAMIN D) 2000 UNITS CAPS Take 1 capsule by mouth daily.   Marland Kitchen ENTRESTO 49-51 MG TAKE ONE TABLET BY MOUTH TWO TIMES A DAY  . gabapentin (NEURONTIN) 300 MG capsule Take 1 capsule (300 mg total) by mouth 3 (three) times daily as needed (NERVE PAIN).  Marland Kitchen levocetirizine (XYZAL) 5 MG tablet TAKE ONE TABLET BY MOUTH EVERY EVENING     Allergies:   Penicillins   Social History   Socioeconomic History  . Marital status: Married    Spouse name: Not on file  . Number of children: Not on file  . Years of education: Not on file  . Highest education level: Not on file  Occupational History  . Not on file  Tobacco Use  . Smoking status: Former Smoker    Packs/day: 0.50    Years: 8.00    Pack years: 4.00    Types: Cigarettes    Start date: 07/24/1966    Quit date: 07/24/1974    Years since quitting: 44.5  . Smokeless tobacco: Never Used  Substance and Sexual Activity  . Alcohol use: No    Alcohol/week: 0.0 standard drinks  . Drug use: No  . Sexual activity: Yes    Birth control/protection: None  Other Topics Concern  .  Not on file  Social History Narrative  . Not on file   Social Determinants of Health   Financial Resource Strain:   . Difficulty of Paying Living Expenses: Not on file  Food Insecurity:   . Worried About Charity fundraiser in the Last Year: Not on file  . Ran Out of Food in the Last Year: Not on file  Transportation Needs:   . Lack of Transportation (Medical): Not on file  . Lack of Transportation (Non-Medical): Not on file  Physical Activity:   . Days of Exercise per Week: Not on file  . Minutes of Exercise per Session: Not on file  Stress:   . Feeling of Stress : Not on file  Social Connections:   . Frequency of Communication with Friends and Family: Not on file  . Frequency of Social Gatherings with Friends and Family: Not on file  . Attends Religious Services: Not on file  . Active Member of Clubs or Organizations: Not  on file  . Attends Archivist Meetings: Not on file  . Marital Status: Not on file     Family History: The patient's family history includes AAA (abdominal aortic aneurysm) in his father.  ROS:   Please see the history of present illness.    ROS  All other systems reviewed and negative.   EKGs/Labs/Other Studies Reviewed:    The following studies were reviewed today: PAP compliance download  EKG:  EKG is not ordered today.   Recent Labs: 03/05/2018: Hemoglobin 14.9; Platelets 246 09/12/2018: ALT 19; BUN 15; Creat 1.12; Potassium 4.9; Sodium 142; TSH 1.39   Recent Lipid Panel    Component Value Date/Time   CHOL 110 03/05/2018 0959   TRIG 93 03/05/2018 0959   HDL 42 03/05/2018 0959   CHOLHDL 2.6 03/05/2018 0959   VLDL 29 04/27/2016 0822   LDLCALC 50 03/05/2018 0959    Physical Exam:    VS:  BP 112/68   Pulse 66   Ht 5\' 10"  (1.778 m)   Wt 283 lb 3.2 oz (128.5 kg)   SpO2 94%   BMI 40.63 kg/m     Wt Readings from Last 3 Encounters:  01/12/19 283 lb 3.2 oz (128.5 kg)  11/07/18 280 lb (127 kg)  09/12/18 270 lb (122.5 kg)     GEN:  Well nourished, well developed in no acute distress HEENT: Normal NECK: No JVD; No carotid bruits LYMPHATICS: No lymphadenopathy CARDIAC: RRR, no murmurs, rubs, gallops RESPIRATORY:  Clear to auscultation without rales, wheezing or rhonchi  ABDOMEN: Soft, non-tender, non-distended MUSCULOSKELETAL:  No edema; No deformity  SKIN: Warm and dry NEUROLOGIC:  Alert and oriented x 3 PSYCHIATRIC:  Normal affect   ASSESSMENT:    1. OSA (obstructive sleep apnea)   2. Benign essential HTN   3. Obesity (BMI 30-39.9)    PLAN:    In order of problems listed above:  1.  OSA - The patient is tolerating PAP therapy well without any problems. The patient has been using and benefiting from PAP use and will continue to benefit from therapy. His device is very old and cannot get data on AHI or compliance.  I will order him a new Resmed  CPAP at 12cm H2O and get a download 4 weeks later. I gave him a Rx as well in case he wants to order it online.  He will see me back in 10 weeks after getting his new device to make sure he is not having  any problems.   2.  HTN -BP controlled -continue Carvedilol 3.125mg  BID, Entresto 49-51mg  BID  3.  Obesity -I have encouraged him to get into a routine exercise program and cut back on carbs and portions.      Medication Adjustments/Labs and Tests Ordered: Current medicines are reviewed at length with the patient today.  Concerns regarding medicines are outlined above.  No orders of the defined types were placed in this encounter.  No orders of the defined types were placed in this encounter.   Signed, Fransico Him, MD  01/12/2019 2:28 PM    [place]

## 2019-01-12 ENCOUNTER — Telehealth: Payer: Self-pay | Admitting: *Deleted

## 2019-01-12 ENCOUNTER — Ambulatory Visit (INDEPENDENT_AMBULATORY_CARE_PROVIDER_SITE_OTHER): Payer: BC Managed Care – PPO | Admitting: Cardiology

## 2019-01-12 ENCOUNTER — Other Ambulatory Visit: Payer: Self-pay

## 2019-01-12 VITALS — BP 112/68 | HR 66 | Ht 70.0 in | Wt 283.2 lb

## 2019-01-12 DIAGNOSIS — E669 Obesity, unspecified: Secondary | ICD-10-CM | POA: Diagnosis not present

## 2019-01-12 DIAGNOSIS — G4733 Obstructive sleep apnea (adult) (pediatric): Secondary | ICD-10-CM | POA: Diagnosis not present

## 2019-01-12 DIAGNOSIS — I10 Essential (primary) hypertension: Secondary | ICD-10-CM

## 2019-01-12 NOTE — Patient Instructions (Signed)
Medication Instructions:  Your physician recommends that you continue on your current medications as directed. Please refer to the Current Medication list given to you today.  *If you need a refill on your cardiac medications before your next appointment, please call your pharmacy*  Follow-Up: At [facility], you and your health needs are our priority.  As part of our continuing mission to provide you with exceptional heart care, we have created designated Provider Care Teams.  These Care Teams include your primary Cardiologist (physician) and Advanced Practice Providers (APPs -  Physician Assistants and Nurse Practitioners) who all work together to provide you with the care you need, when you need it.    

## 2019-01-12 NOTE — Telephone Encounter (Signed)
Patient has a Development worker, community 2001 unit.   This unit is not able to be downloaded.   The unit has 9,874 total hours and 1,096 days greater than 4 hours of usage.   This is all the compliance data that is able to be retrieved.   CURRENT PRESSURE IS 12 CM  SN# [id number] on unit

## 2019-01-12 NOTE — Telephone Encounter (Signed)
Order placed to [facility] via fax

## 2019-01-12 NOTE — Telephone Encounter (Signed)
-----   Message from Sueanne Margarita, MD sent at 01/12/2019  2:30 PM EST ----- Please order a ResMed CPAP at [facility] with heated humidity and mask of choice.  He will need a followup  vritual in 10 weeks from when he gets his device.

## 2019-01-16 ENCOUNTER — Ambulatory Visit (INDEPENDENT_AMBULATORY_CARE_PROVIDER_SITE_OTHER): Payer: BC Managed Care – PPO | Admitting: Family Medicine

## 2019-01-16 ENCOUNTER — Encounter: Payer: Self-pay | Admitting: Family Medicine

## 2019-01-16 ENCOUNTER — Other Ambulatory Visit: Payer: Self-pay

## 2019-01-16 VITALS — BP 138/74 | HR 66 | Temp 97.8°F | Resp 18 | Ht 70.0 in | Wt 277.0 lb

## 2019-01-16 DIAGNOSIS — M5431 Sciatica, right side: Secondary | ICD-10-CM

## 2019-01-16 MED ORDER — PREDNISONE 20 MG PO TABS: ORAL_TABLET | ORAL | 0 refills | Status: DC

## 2019-01-16 MED ORDER — TIZANIDINE HCL 4 MG PO TABS: 4.0000 mg | ORAL_TABLET | Freq: Four times a day (QID) | ORAL | 0 refills | Status: DC | PRN

## 2019-01-16 NOTE — Progress Notes (Signed)
Subjective:    Patient ID: Benjamin [name], male    DOB: 12-20-1950, 69 y.o.   MRN: [id number]  HPI  Patient presents today with 3 to 4 days of pain emanating from his right gluteal area and right lateral hip.  It is a burning intense throbbing pain that radiates down his right posterior lateral leg into his right calf.  The pain comes and goes with movement.  He has pain while laying supine on the table today.  He has pain with positive straight leg raise.  He has no pain with flexion or extension of the hip with the knee flexed.  He has no pain with internal or external rotation of the hip.  He has no tenderness palpation of the greater trochanteric bursa.  He has no pain with resisted knee flexion or resisted hip extension.  There is no tenderness to palpation in the hamstring, gluteus, or gastrocnemius muscles.  There is no erythema or swelling.  Pain sounds neuropathic in character and quality. Past Medical History:  Diagnosis Date  . Allergy   . ASCVD (arteriosclerotic cardiovascular disease)   . Hyperlipidemia   . Hypertension   . Myocardial infarction ([place]) 04/20/1999  . Obesity (BMI 30-39.9)   . OSA (obstructive sleep apnea) 11/02/2015   Past Surgical History:  Procedure Laterality Date  . CARDIAC CATHETERIZATION N/A 05/19/2015   Procedure: Right/Left Heart Cath and Coronary Angiography;  Surgeon: Belva Crome, MD;  Location: [facility];  Service: Cardiovascular;  Laterality: N/A;  . CORONARY STENT PLACEMENT  04/18/1999   Current Outpatient Medications on File Prior to Visit  Medication Sig Dispense Refill  . aspirin 81 MG tablet Take 81 mg by mouth daily. Reported on 05/05/2015    . atorvastatin (LIPITOR) 80 MG tablet TAKE ONE TABLET BY MOUTH DAILY AT 6P IN THE EVENING 90 tablet 0  . carvedilol (COREG) 3.125 MG tablet Take 1 tablet (3.125 mg total) by mouth 2 (two) times daily. 180 tablet 1  . Cholecalciferol (VITAMIN D) 2000 UNITS CAPS Take 1 capsule by mouth daily.     Marland Kitchen  ENTRESTO 49-51 MG TAKE ONE TABLET BY MOUTH TWO TIMES A DAY 180 tablet 3  . levocetirizine (XYZAL) 5 MG tablet TAKE ONE TABLET BY MOUTH EVERY EVENING 90 tablet 1  . gabapentin (NEURONTIN) 300 MG capsule Take 1 capsule (300 mg total) by mouth 3 (three) times daily as needed (NERVE PAIN). (Patient not taking: Reported on 01/16/2019) 90 capsule 3   No current facility-administered medications on file prior to visit.   Allergies  Allergen Reactions  . Penicillins Swelling   Social History   Socioeconomic History  . Marital status: Married    Spouse name: Not on file  . Number of children: Not on file  . Years of education: Not on file  . Highest education level: Not on file  Occupational History  . Not on file  Tobacco Use  . Smoking status: Former Smoker    Packs/day: 0.50    Years: 8.00    Pack years: 4.00    Types: Cigarettes    Start date: 07/24/1966    Quit date: 07/24/1974    Years since quitting: 44.5  . Smokeless tobacco: Never Used  Substance and Sexual Activity  . Alcohol use: No    Alcohol/week: 0.0 standard drinks  . Drug use: No  . Sexual activity: Yes    Birth control/protection: None  Other Topics Concern  . Not on file  Social History  Narrative  . Not on file   Social Determinants of Health   Financial Resource Strain:   . Difficulty of Paying Living Expenses: Not on file  Food Insecurity:   . Worried About Charity fundraiser in the Last Year: Not on file  . Ran Out of Food in the Last Year: Not on file  Transportation Needs:   . Lack of Transportation (Medical): Not on file  . Lack of Transportation (Non-Medical): Not on file  Physical Activity:   . Days of Exercise per Week: Not on file  . Minutes of Exercise per Session: Not on file  Stress:   . Feeling of Stress : Not on file  Social Connections:   . Frequency of Communication with Friends and Family: Not on file  . Frequency of Social Gatherings with Friends and Family: Not on file  . Attends  Religious Services: Not on file  . Active Member of Clubs or Organizations: Not on file  . Attends Archivist Meetings: Not on file  . Marital Status: Not on file  Intimate Partner Violence:   . Fear of Current or Ex-Partner: Not on file  . Emotionally Abused: Not on file  . Physically Abused: Not on file  . Sexually Abused: Not on file     Review of Systems  All other systems reviewed and are negative.      Objective:   Physical Exam  Constitutional: He appears well-developed and well-nourished.  HENT:  Nose: No mucosal edema or rhinorrhea. Right sinus exhibits no maxillary sinus tenderness and no frontal sinus tenderness. Left sinus exhibits no maxillary sinus tenderness and no frontal sinus tenderness.  Cardiovascular: Normal rate, regular rhythm and normal heart sounds.  No murmur heard. Pulmonary/Chest: Effort normal and breath sounds normal. No respiratory distress. He has no wheezes. He has no rales.  Musculoskeletal:     Lumbar back: Tenderness present. No bony tenderness. Decreased range of motion.     Right upper leg: No swelling, edema, tenderness or bony tenderness.       Legs:  Vitals reviewed.         Assessment & Plan:  Right sciatica Begin prednisone taper pack over the next 6 days and supplement with Zanaflex 4 mg every 6 hours as needed for muscle spasm.  Recheck next week if no better or sooner if worse.

## 2019-01-19 ENCOUNTER — Encounter: Payer: Self-pay | Admitting: Family Medicine

## 2019-01-19 ENCOUNTER — Other Ambulatory Visit: Payer: Self-pay | Admitting: Family Medicine

## 2019-01-19 MED ORDER — OXYCODONE-ACETAMINOPHEN 7.5-325 MG PO TABS: 1.0000 | ORAL_TABLET | ORAL | 0 refills | Status: DC | PRN

## 2019-01-20 ENCOUNTER — Encounter: Payer: Self-pay | Admitting: Family Medicine

## 2019-01-21 ENCOUNTER — Encounter: Payer: Self-pay | Admitting: Family Medicine

## 2019-01-21 NOTE — Telephone Encounter (Signed)
Apt made so pt could discuss back pain with patient

## 2019-01-22 ENCOUNTER — Ambulatory Visit (INDEPENDENT_AMBULATORY_CARE_PROVIDER_SITE_OTHER): Payer: BC Managed Care – PPO | Admitting: Family Medicine

## 2019-01-22 ENCOUNTER — Other Ambulatory Visit: Payer: Self-pay

## 2019-01-22 DIAGNOSIS — M5416 Radiculopathy, lumbar region: Secondary | ICD-10-CM | POA: Diagnosis not present

## 2019-01-22 MED ORDER — HYDROMORPHONE HCL 4 MG PO TABS: 4.0000 mg | ORAL_TABLET | Freq: Four times a day (QID) | ORAL | 0 refills | Status: DC | PRN

## 2019-01-22 MED ORDER — PREDNISONE 20 MG PO TABS: ORAL_TABLET | ORAL | 0 refills | Status: DC

## 2019-01-22 NOTE — Progress Notes (Signed)
Subjective:    Patient ID: Benjamin [name], male    DOB: [birth year], 69 y.o.   MRN: [id number]  HPI  Patient is being seen today as a telephone visit.  He consents to be seen today via telephone.  Phone call began at 1040.  Phone call concluded at 1050.  Please see my previous office visit.  Patient was seen January 15 for right leg pain was diagnosed with sciatica.  He was started on a prednisone taper pack and was given Percocet for breakthrough pain.  Patient states that the pain is no better.  He states that the pain originates in the back and on the side of his right hip.  It will radiate down the side of his right leg into his right knee and then radiate across his right shin to his ankle.  It does not extend into the foot.  He denies any numbness or weakness in the leg.  He denies any bowel or bladder incontinence.  He denies any weakness in the left leg.  However the pain is not improving.  The pain has been present for approximately 1 week.  He had similar symptoms in 2019 that took several weeks to resolve.  X-ray at that time did show degenerative disc disease.  Patient has not had an MRI.  He denies any pain to palpation over the greater trochanter.  He denies any pain when he lays on the side of his leg.  However whenever he is completely supine in bed, the pain will radiate down his leg more.  If he sits up in a chair the pain is somewhat better.  He denies any claudication-like symptoms.  He denies any redness or swelling in the leg.  He denies any erythema in the leg. Past Medical History:  Diagnosis Date  . Allergy   . ASCVD (arteriosclerotic cardiovascular disease)   . Hyperlipidemia   . Hypertension   . Myocardial infarction ([place]) 04/20/1999  . Obesity (BMI 30-39.9)   . OSA (obstructive sleep apnea) 11/02/2015   Past Surgical History:  Procedure Laterality Date  . CARDIAC CATHETERIZATION N/A 05/19/2015   Procedure: Right/Left Heart Cath and Coronary Angiography;  Surgeon: Belva Crome, MD;  Location: [facility];  Service: Cardiovascular;  Laterality: N/A;  . CORONARY STENT PLACEMENT  04/18/1999   Current Outpatient Medications on File Prior to Visit  Medication Sig Dispense Refill  . aspirin 81 MG tablet Take 81 mg by mouth daily. Reported on 05/05/2015    . atorvastatin (LIPITOR) 80 MG tablet TAKE ONE TABLET BY MOUTH DAILY AT 6P IN THE EVENING 90 tablet 0  . carvedilol (COREG) 3.125 MG tablet Take 1 tablet (3.125 mg total) by mouth 2 (two) times daily. 180 tablet 1  . Cholecalciferol (VITAMIN D) 2000 UNITS CAPS Take 1 capsule by mouth daily.     Marland Kitchen ENTRESTO 49-51 MG TAKE ONE TABLET BY MOUTH TWO TIMES A DAY 180 tablet 3  . gabapentin (NEURONTIN) 300 MG capsule Take 1 capsule (300 mg total) by mouth 3 (three) times daily as needed (NERVE PAIN). (Patient not taking: Reported on 01/16/2019) 90 capsule 3  . levocetirizine (XYZAL) 5 MG tablet TAKE ONE TABLET BY MOUTH EVERY EVENING 90 tablet 1  . oxyCODONE-acetaminophen (PERCOCET) 7.5-325 MG tablet Take 1 tablet by mouth every 4 (four) hours as needed for severe pain. 30 tablet 0  . tiZANidine (ZANAFLEX) 4 MG tablet Take 1 tablet (4 mg total) by mouth every 6 (six) hours as  needed for muscle spasms. 30 tablet 0   No current facility-administered medications on file prior to visit.   Allergies  Allergen Reactions  . Penicillins Swelling   Social History   Socioeconomic History  . Marital status: Married    Spouse name: Not on file  . Number of children: Not on file  . Years of education: Not on file  . Highest education level: Not on file  Occupational History  . Not on file  Tobacco Use  . Smoking status: Former Smoker    Packs/day: 0.50    Years: 8.00    Pack years: 4.00    Types: Cigarettes    Start date: 07/24/1966    Quit date: 07/24/1974    Years since quitting: 44.5  . Smokeless tobacco: Never Used  Substance and Sexual Activity  . Alcohol use: No    Alcohol/week: 0.0 standard drinks  . Drug use:  No  . Sexual activity: Yes    Birth control/protection: None  Other Topics Concern  . Not on file  Social History Narrative  . Not on file   Social Determinants of Health   Financial Resource Strain:   . Difficulty of Paying Living Expenses: Not on file  Food Insecurity:   . Worried About Charity fundraiser in the Last Year: Not on file  . Ran Out of Food in the Last Year: Not on file  Transportation Needs:   . Lack of Transportation (Medical): Not on file  . Lack of Transportation (Non-Medical): Not on file  Physical Activity:   . Days of Exercise per Week: Not on file  . Minutes of Exercise per Session: Not on file  Stress:   . Feeling of Stress : Not on file  Social Connections:   . Frequency of Communication with Friends and Family: Not on file  . Frequency of Social Gatherings with Friends and Family: Not on file  . Attends Religious Services: Not on file  . Active Member of Clubs or Organizations: Not on file  . Attends Archivist Meetings: Not on file  . Marital Status: Not on file  Intimate Partner Violence:   . Fear of Current or Ex-Partner: Not on file  . Emotionally Abused: Not on file  . Physically Abused: Not on file  . Sexually Abused: Not on file     Review of Systems  All other systems reviewed and are negative.      Objective:   Physical Exam   Physical exam cannot be performed today as the patient was seen as a telephone visit however he denies any tenderness to palpation over the greater trochanter.  I wanted to rule out greater trochanteric bursitis with IT band syndrome however there is no tenderness to palpation over the IT band or the greater trochanter.  Instead the pain sounds neuropathic and is radiating down the lateral aspect of his hip down his thigh across his knee and into his anterior right shin.  The pain sounds neuropathic in nature and I still favor lumbar radiculopathy as a cause.     Assessment & Plan:  Lumbar  radiculopathy  Patient's pain still sounds neuropathic.  I favor sciatica or some type of lumbar radiculopathy.  Pain has been present for 1 week.  I explained to the patient that pain from herniated disc in his back can sometimes take up to 6 weeks to recover.  Therefore I would give the patient an additional prednisone taper pack.  We will discontinue  Percocet and switch to Dilaudid 4 mg every 4-6 hours as needed for pain.  Reassess in 1 week.  If pain is not improving at that point, I would try to schedule the patient for an MRI and if insurance would not cover that I would consult orthopedics given the fact the patient's pain is severe and is not seem to be improving with conservative measures.  He very well could benefit from an epidural steroid injection.

## 2019-02-02 ENCOUNTER — Encounter: Payer: Self-pay | Admitting: Family Medicine

## 2019-02-03 ENCOUNTER — Other Ambulatory Visit: Payer: Self-pay | Admitting: Family Medicine

## 2019-02-03 DIAGNOSIS — M5431 Sciatica, right side: Secondary | ICD-10-CM

## 2019-02-06 ENCOUNTER — Encounter: Payer: Self-pay | Admitting: Family Medicine

## 2019-02-09 ENCOUNTER — Other Ambulatory Visit: Payer: Self-pay | Admitting: Family Medicine

## 2019-02-09 DIAGNOSIS — M5431 Sciatica, right side: Secondary | ICD-10-CM

## 2019-02-09 MED ORDER — CELECOXIB 200 MG PO CAPS: 200.0000 mg | ORAL_CAPSULE | Freq: Two times a day (BID) | ORAL | 0 refills | Status: DC | PRN

## 2019-02-13 DIAGNOSIS — M545 Low back pain: Secondary | ICD-10-CM | POA: Diagnosis not present

## 2019-02-14 ENCOUNTER — Other Ambulatory Visit: Payer: Self-pay | Admitting: Family Medicine

## 2019-02-17 ENCOUNTER — Other Ambulatory Visit: Payer: Self-pay | Admitting: Family Medicine

## 2019-02-20 DIAGNOSIS — M5416 Radiculopathy, lumbar region: Secondary | ICD-10-CM | POA: Diagnosis not present

## 2019-02-25 DIAGNOSIS — M545 Low back pain: Secondary | ICD-10-CM | POA: Diagnosis not present

## 2019-02-25 DIAGNOSIS — M5136 Other intervertebral disc degeneration, lumbar region: Secondary | ICD-10-CM | POA: Diagnosis not present

## 2019-02-25 DIAGNOSIS — M48061 Spinal stenosis, lumbar region without neurogenic claudication: Secondary | ICD-10-CM | POA: Diagnosis not present

## 2019-02-26 DIAGNOSIS — Z23 Encounter for immunization: Secondary | ICD-10-CM | POA: Diagnosis not present

## 2019-03-08 ENCOUNTER — Other Ambulatory Visit: Payer: Self-pay | Admitting: Family Medicine

## 2019-03-19 DIAGNOSIS — M48062 Spinal stenosis, lumbar region with neurogenic claudication: Secondary | ICD-10-CM | POA: Diagnosis not present

## 2019-03-19 DIAGNOSIS — M5136 Other intervertebral disc degeneration, lumbar region: Secondary | ICD-10-CM | POA: Diagnosis not present

## 2019-03-24 DIAGNOSIS — H6123 Impacted cerumen, bilateral: Secondary | ICD-10-CM | POA: Diagnosis not present

## 2019-03-24 DIAGNOSIS — H900 Conductive hearing loss, bilateral: Secondary | ICD-10-CM | POA: Diagnosis not present

## 2019-03-24 DIAGNOSIS — R42 Dizziness and giddiness: Secondary | ICD-10-CM | POA: Diagnosis not present

## 2019-03-31 DIAGNOSIS — H6123 Impacted cerumen, bilateral: Secondary | ICD-10-CM | POA: Diagnosis not present

## 2019-03-31 DIAGNOSIS — R42 Dizziness and giddiness: Secondary | ICD-10-CM | POA: Diagnosis not present

## 2019-03-31 DIAGNOSIS — H900 Conductive hearing loss, bilateral: Secondary | ICD-10-CM | POA: Diagnosis not present

## 2019-04-02 DIAGNOSIS — M5136 Other intervertebral disc degeneration, lumbar region: Secondary | ICD-10-CM | POA: Diagnosis not present

## 2019-04-02 DIAGNOSIS — M48061 Spinal stenosis, lumbar region without neurogenic claudication: Secondary | ICD-10-CM | POA: Diagnosis not present

## 2019-04-07 ENCOUNTER — Other Ambulatory Visit: Payer: Self-pay | Admitting: Family Medicine

## 2019-04-07 DIAGNOSIS — Z23 Encounter for immunization: Secondary | ICD-10-CM | POA: Diagnosis not present

## 2019-04-13 ENCOUNTER — Other Ambulatory Visit: Payer: Self-pay | Admitting: Family Medicine

## 2019-04-21 DIAGNOSIS — M5136 Other intervertebral disc degeneration, lumbar region: Secondary | ICD-10-CM | POA: Diagnosis not present

## 2019-04-25 ENCOUNTER — Other Ambulatory Visit: Payer: Self-pay | Admitting: Cardiology

## 2019-05-02 ENCOUNTER — Other Ambulatory Visit: Payer: Self-pay | Admitting: Cardiology

## 2019-06-17 ENCOUNTER — Encounter: Payer: Self-pay | Admitting: Family Medicine

## 2019-06-23 ENCOUNTER — Ambulatory Visit (INDEPENDENT_AMBULATORY_CARE_PROVIDER_SITE_OTHER): Payer: BC Managed Care – PPO | Admitting: Family Medicine

## 2019-06-23 ENCOUNTER — Other Ambulatory Visit: Payer: Self-pay

## 2019-06-23 VITALS — BP 110/72 | HR 54 | Temp 97.9°F | Ht 70.0 in | Wt 270.0 lb

## 2019-06-23 DIAGNOSIS — I5022 Chronic systolic (congestive) heart failure: Secondary | ICD-10-CM

## 2019-06-23 DIAGNOSIS — I2510 Atherosclerotic heart disease of native coronary artery without angina pectoris: Secondary | ICD-10-CM | POA: Diagnosis not present

## 2019-06-23 DIAGNOSIS — R7303 Prediabetes: Secondary | ICD-10-CM | POA: Diagnosis not present

## 2019-06-23 NOTE — Progress Notes (Signed)
Subjective:    Patient ID: Benjamin [name], male    DOB: Oct 08, 1950, 69 y.o.   MRN: [id number]  HPI Patient is a very pleasant 69 year old male with PMH of CHF (ECHO 2017 with EF 45-50%), CAD, and DM2 (last HgA1c was 6.1 9/20).  Patient recently message me saying that his legs were swelling and that his feet were swelling.  He also reported purplish discoloration in his right foot below the medial malleolus as well as purple and bluish discoloration in his toes.  Therefore I asked him to come in as soon as possible due to my concern about possible peripheral vascular disease.  Today on exam there is no discoloration visible.  Patient does have large varicose veins in both legs.  However there is no pitting edema.  There is no blue or purple discoloration to the skin.  He does have a subungual hematoma on his right second toe.  I believe this is the discoloration he was referencing in his toes.  This is simply a hematoma.  He has good strong dorsalis pedis and posterior tibialis pulses bilaterally.  He denies any chest pain shortness of breath or dyspnea on exertion.  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  He denies any burning stinging pain however he does report a discomfort in a thick sensation in his feet which I attribute to numbness and lack of two-point discrimination.    Past Medical History:  Diagnosis Date   Allergy    ASCVD (arteriosclerotic cardiovascular disease)    Hyperlipidemia    Hypertension    Myocardial infarction ([place]) 04/20/1999   Obesity (BMI 30-39.9)    OSA (obstructive sleep apnea) 11/02/2015   Past Surgical History:  Procedure Laterality Date   CARDIAC CATHETERIZATION N/A 05/19/2015   Procedure: Right/Left Heart Cath and Coronary Angiography;  Surgeon: Belva Crome, MD;  Location: [place] [facility];  Service: Cardiovascular;  Laterality: N/A;   CORONARY STENT PLACEMENT  04/18/1999   Current Outpatient Medications on File Prior to Visit  Medication Sig  Dispense Refill   aspirin 81 MG tablet Take 81 mg by mouth daily. Reported on 05/05/2015     atorvastatin (LIPITOR) 80 MG tablet TAKE 1 TABLET BY MOUTH DAILY AT 6PM IN THE EVENING 90 tablet 0   carvedilol (COREG) 3.125 MG tablet TAKE 1 TABLET BY MOUTH TWO TIMES A DAY 180 tablet 3   celecoxib (CELEBREX) 200 MG capsule TAKE ONE CAPSULE BY MOUTH TWICE A DAY AS NEEDED FOR MODERATE PAIN 60 capsule 3   Cholecalciferol (VITAMIN D) 2000 UNITS CAPS Take 1 capsule by mouth daily.      ENTRESTO 49-51 MG TAKE ONE TABLET BY MOUTH TWICE A DAY 180 tablet 2   gabapentin (NEURONTIN) 300 MG capsule Take 1 capsule (300 mg total) by mouth 3 (three) times daily as needed (NERVE PAIN). (Patient not taking: Reported on 01/16/2019) 90 capsule 3   HYDROmorphone (DILAUDID) 4 MG tablet Take 1 tablet (4 mg total) by mouth every 6 (six) hours as needed for severe pain. 30 tablet 0   levocetirizine (XYZAL) 5 MG tablet TAKE ONE TABLET BY MOUTH EVERY EVENING 90 tablet 2   oxyCODONE-acetaminophen (PERCOCET) 7.5-325 MG tablet Take 1 tablet by mouth every 4 (four) hours as needed for severe pain. 30 tablet 0   predniSONE (DELTASONE) 20 MG tablet 3 tabs poqday 1-2, 2 tabs poqday 3-4, 1 tab poqday 5-6 12 tablet 0   tiZANidine (ZANAFLEX) 4 MG tablet Take 1 tablet (4 mg total) by  mouth every 6 (six) hours as needed for muscle spasms. 30 tablet 0   No current facility-administered medications on file prior to visit.   Allergies  Allergen Reactions   Penicillins Swelling   Social History   Socioeconomic History   Marital status: Married    Spouse name: Not on file   Number of children: Not on file   Years of education: Not on file   Highest education level: Not on file  Occupational History   Not on file  Tobacco Use   Smoking status: Former Smoker    Packs/day: 0.50    Years: 8.00    Pack years: 4.00    Types: Cigarettes    Start date: 07/24/1966    Quit date: 07/24/1974    Years since quitting: 44.9    Smokeless tobacco: Never Used  Substance and Sexual Activity   Alcohol use: No    Alcohol/week: 0.0 standard drinks   Drug use: No   Sexual activity: Yes    Birth control/protection: None  Other Topics Concern   Not on file  Social History Narrative   Not on file   Social Determinants of Health   Financial Resource Strain:    Difficulty of Paying Living Expenses:   Food Insecurity:    Worried About Charity fundraiser in the Last Year:    Arboriculturist in the Last Year:   Transportation Needs:    Film/video editor (Medical):    Lack of Transportation (Non-Medical):   Physical Activity:    Days of Exercise per Week:    Minutes of Exercise per Session:   Stress:    Feeling of Stress :   Social Connections:    Frequency of Communication with Friends and Family:    Frequency of Social Gatherings with Friends and Family:    Attends Religious Services:    Active Member of Clubs or Organizations:    Attends Music therapist:    Marital Status:   Intimate Partner Violence:    Fear of Current or Ex-Partner:    Emotionally Abused:    Physically Abused:    Sexually Abused:      Review of Systems  All other systems reviewed and are negative.      Objective:   Physical Exam Vitals reviewed.  Constitutional:      Appearance: He is well-developed.  HENT:     Right Ear: Tympanic membrane and ear canal normal.     Left Ear: Tympanic membrane and ear canal normal.     Nose: No mucosal edema or rhinorrhea.     Right Sinus: No maxillary sinus tenderness or frontal sinus tenderness.     Left Sinus: No maxillary sinus tenderness or frontal sinus tenderness.     Mouth/Throat:     Pharynx: No oropharyngeal exudate.  Eyes:     Conjunctiva/sclera: Conjunctivae normal.     Pupils: Pupils are equal, round, and reactive to light.  Cardiovascular:     Rate and Rhythm: Normal rate and regular rhythm.     Heart sounds: Normal heart sounds.  No murmur heard.   Pulmonary:     Effort: Pulmonary effort is normal. No respiratory distress.     Breath sounds: Normal breath sounds. No wheezing or rales.  Musculoskeletal:     Cervical back: Neck supple.  Lymphadenopathy:     Cervical: No cervical adenopathy.           Assessment & Plan:  CAD in native  artery  Prediabetes - Plan: Hemoglobin A1c, CBC with Differential/Platelet, COMPLETE METABOLIC PANEL WITH GFR, Lipid panel, Microalbumin, urine  Chronic systolic heart failure (HCC) - Plan: CANCELED: Brain natriuretic peptide  Today on his exam there is no evidence of peripheral edema.  There is no sign of fluid overload.  There is also no sign of peripheral vascular disease.  I believe that the discoloration he referenced in his feet was due to a subungual hematoma in his right second toe as well as possible venous congestion causing a discoloration to the feet due to swelling in the legs.  However now that the swelling has subsided, the discoloration has improved.  Therefore I believe the swelling was more likely due to his venous insufficiency and dependent edema rather than fluid overload due to congestive heart failure.  Today on exam, the patient is clearly not fluid overloaded.  His blood pressure is well controlled.  While the patient is here today I would like to check a CBC, CMP, fasting lipid panel, A1c, and urine microalbumin.  If lab work is normal I see no reason to change treatment at the present time.

## 2019-06-24 LAB — COMPLETE METABOLIC PANEL WITH GFR
AG Ratio: 1.9 (calc) (ref 1.0–2.5)
ALT: 21 U/L (ref 9–46)
AST: 15 U/L (ref 10–35)
Albumin: 4 g/dL (ref 3.6–5.1)
Alkaline phosphatase (APISO): 107 U/L (ref 35–144)
BUN: 11 mg/dL (ref 7–25)
CO2: 26 mmol/L (ref 20–32)
Calcium: 9.2 mg/dL (ref 8.6–10.3)
Chloride: 104 mmol/L (ref 98–110)
Creat: 1.02 mg/dL (ref 0.70–1.25)
GFR, Est African American: 87 mL/min/{1.73_m2} (ref >=60)
GFR, Est Non African American: 75 mL/min/{1.73_m2} (ref >=60)
Globulin: 2.1 g/dL (calc) (ref 1.9–3.7)
Glucose, Bld: 125 mg/dL — ABNORMAL HIGH (ref 65–99)
Potassium: 4.3 mmol/L (ref 3.5–5.3)
Sodium: 140 mmol/L (ref 135–146)
Total Bilirubin: 0.8 mg/dL (ref 0.2–1.2)
Total Protein: 6.1 g/dL (ref 6.1–8.1)

## 2019-06-24 LAB — CBC WITH DIFFERENTIAL/PLATELET
Absolute Monocytes: 590 cells/uL (ref 200–950)
Basophils Absolute: 44 cells/uL (ref 0–200)
Basophils Relative: 0.5 %
Eosinophils Absolute: 150 cells/uL (ref 15–500)
Eosinophils Relative: 1.7 %
HCT: 47.9 % (ref 38.5–50.0)
Hemoglobin: 15.7 g/dL (ref 13.2–17.1)
Lymphs Abs: 2710 cells/uL (ref 850–3900)
MCH: 28.5 pg (ref 27.0–33.0)
MCHC: 32.8 g/dL (ref 32.0–36.0)
MCV: 87.1 fL (ref 80.0–100.0)
MPV: 10 fL (ref 7.5–12.5)
Monocytes Relative: 6.7 %
Neutro Abs: 5306 cells/uL (ref 1500–7800)
Neutrophils Relative %: 60.3 %
Platelets: 252 10*3/uL (ref 140–400)
RBC: 5.5 10*6/uL (ref 4.20–5.80)
RDW: 13.1 % (ref 11.0–15.0)
Total Lymphocyte: 30.8 %
WBC: 8.8 10*3/uL (ref 3.8–10.8)

## 2019-06-24 LAB — LIPID PANEL
Cholesterol: 141 mg/dL (ref <200)
HDL: 47 mg/dL (ref >=40)
LDL Cholesterol (Calc): 70 mg/dL (calc)
Non-HDL Cholesterol (Calc): 94 mg/dL (calc) (ref <130)
Total CHOL/HDL Ratio: 3 (calc) (ref <5.0)
Triglycerides: 154 mg/dL — ABNORMAL HIGH (ref <150)

## 2019-06-24 LAB — HEMOGLOBIN A1C
Hgb A1c MFr Bld: 6.3 % of total Hgb — ABNORMAL HIGH (ref <5.7)
Mean Plasma Glucose: 134 (calc)
eAG (mmol/L): 7.4 (calc)

## 2019-06-24 LAB — MICROALBUMIN, URINE: Microalb, Ur: <0.2 mg/dL

## 2019-06-29 DIAGNOSIS — R42 Dizziness and giddiness: Secondary | ICD-10-CM | POA: Diagnosis not present

## 2019-06-29 DIAGNOSIS — H6123 Impacted cerumen, bilateral: Secondary | ICD-10-CM | POA: Diagnosis not present

## 2019-11-02 DIAGNOSIS — Z23 Encounter for immunization: Secondary | ICD-10-CM | POA: Diagnosis not present

## 2019-11-25 ENCOUNTER — Telehealth: Payer: Self-pay | Admitting: Cardiology

## 2019-11-25 NOTE — Telephone Encounter (Signed)
New message     [facility] authorization has expired and needs a new one for refill

## 2019-11-25 NOTE — Telephone Encounter (Addendum)
Patient calling office inquiring about his Entresto refill & authorization.  He is due to see Dr. Harl Bowie for his 1 year recall this November anyway, so will go ahead & schedule.  Scheduled with Katina Dung, NP on Thursday, 12/03/2019.    States that he does have enough medication for the next week or so.

## 2019-11-25 NOTE — Telephone Encounter (Signed)
Spoke with [name] at [facility]. She states that Prior authorization has been completed and denied.

## 2019-12-02 NOTE — Progress Notes (Addendum)
Cardiology Office Note  Date: 12/03/2019   ID: Benjamin [name], DOB 07-Mar-1950, MRN [id number]  PCP:  Susy Frizzle, MD  Cardiologist:  Carlyle Dolly, MD Electrophysiologist:  None   Chief Complaint: follow up Chronic systolic HF, CAD, HTN, HLD.  History of Present Illness: Benjamin [name] is a 69 y.o. male with a history of CAD, HTN, HLD, OSA, obesity, Chronic systolic HF.  Previous encounter with Dr. Harl Bowie on 09/17/2017 for chronic systolic heart failure, CAD, mixed hyperlipidemia.  Last cardiac catheterization in May 2017 showed patent coronary arteries.  Last echocardiogram September 2017 showed EF of 45 to 50% with G1 DD.  Was experiencing no recent edema, shortness of breath, or DOE.  OSA was followed by Dr. Radford Pax and he was compliant with his CPAP.  His lipids were at goal and he was compliant with statin medication.  Latest encounter with Dr. Radford Pax 01/12/2019.  He was doing well with his CPAP and feeling more rested.  His blood pressure was controlled and he was continuing carvedilol 3.125 mg p.o. twice daily, Entresto 49/51 mg p.o. twice daily.  He was encouraged to get started in an exercise program and cut back on carbs and portions.  He presents today for follow up. He denies any recent acute illnesses or hospitalizations. 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 He states he has no issues with anginal or exertional symptoms when performing his duties. He denies any orthostatic symptoms, palpitations or arrhythmias, PND orthopnea, claudication, DVT / PE like symptoms. No lower extremity edema. He has venous varicosities.  Past Medical History:  Diagnosis Date  . Allergy   . ASCVD (arteriosclerotic cardiovascular disease)   . Hyperlipidemia   . Hypertension   . Myocardial infarction ([place]) 04/20/1999  . Obesity (BMI 30-39.9)   . OSA (obstructive sleep apnea) 11/02/2015    Past Surgical History:  Procedure Laterality Date  . CARDIAC CATHETERIZATION N/A  05/19/2015   Procedure: Right/Left Heart Cath and Coronary Angiography;  Surgeon: Belva Crome, MD;  Location: [place] [facility];  Service: Cardiovascular;  Laterality: N/A;  . CORONARY STENT PLACEMENT  04/18/1999    Current Outpatient Medications  Medication Sig Dispense Refill  . aspirin 81 MG tablet Take 81 mg by mouth daily. Reported on 05/05/2015    . atorvastatin (LIPITOR) 80 MG tablet TAKE 1 TABLET BY MOUTH DAILY AT 6PM IN THE EVENING 90 tablet 0  . carvedilol (COREG) 3.125 MG tablet TAKE 1 TABLET BY MOUTH TWO TIMES A DAY 180 tablet 3  . Cholecalciferol (VITAMIN D) 2000 UNITS CAPS Take 1 capsule by mouth daily.     Marland Kitchen ENTRESTO 49-51 MG TAKE ONE TABLET BY MOUTH TWICE A DAY 180 tablet 2  . gabapentin (NEURONTIN) 300 MG capsule Take 1 capsule (300 mg total) by mouth 3 (three) times daily as needed (NERVE PAIN). 90 capsule 3   No current facility-administered medications for this visit.   Allergies:  Penicillins   Social History: The patient  reports that he quit smoking about 45 years ago. His smoking use included cigarettes. He started smoking about 53 years ago. He has a 4.00 pack-year smoking history. He has never used smokeless tobacco. He reports that he does not drink alcohol and does not use drugs.   Family History: The patient's family history includes AAA (abdominal aortic aneurysm) in his father.   ROS:  Please see the history of present illness. Otherwise, complete review of systems is positive for none.  All  other systems are reviewed and negative.   Physical Exam: VS:  BP 110/70   Pulse (!) 56   Ht 5\' 10"  (1.778 m)   Wt 275 lb (124.7 kg)   SpO2 97%   BMI 39.46 kg/m , BMI Body mass index is 39.46 kg/m.  Wt Readings from Last 3 Encounters:  12/03/19 275 lb (124.7 kg)  06/23/19 270 lb (122.5 kg)  01/16/19 277 lb (125.6 kg)    General: Obese patient appears comfortable at rest. Neck: Supple, no elevated JVP or carotid bruits, no thyromegaly. Lungs: Clear to  auscultation, nonlabored breathing at rest. Cardiac: Regular rate and rhythm, no S3 or significant systolic murmur, no pericardial rub. Extremities: No pitting edema, distal pulses 2+.  Varicosities bilaterally Skin: Warm and dry. Musculoskeletal: No kyphosis. Neuropsychiatric: Alert and oriented x3, affect grossly appropriate.  ECG:  An ECG dated 12/04/2019 was personally reviewed today and demonstrated:  Sinus bradycardia rate of 59  Recent Labwork: 06/23/2019: ALT 21; AST 15; BUN 11; Creat 1.02; Hemoglobin 15.7; Platelets 252; Potassium 4.3; Sodium 140     Component Value Date/Time   CHOL 141 06/23/2019 0814   TRIG 154 (H) 06/23/2019 0814   HDL 47 06/23/2019 0814   CHOLHDL 3.0 06/23/2019 0814   VLDL 29 04/27/2016 0822   LDLCALC 70 06/23/2019 0814    Other Studies Reviewed Today:  05/2015 echo Study Conclusions  - Left ventricle: The cavity size was normal. Wall thickness was normal. Systolic function was severely reduced. The estimated ejection fraction was in the range of 25% to 30%. Diffuse hypokinesis. Doppler parameters are consistent with abnormal left ventricular relaxation (grade 1 diastolic dysfunction). - Regional wall motion abnormality: Akinesis of the mid anterior, basal-mid anteroseptal, and apical myocardium. - Aortic valve: Mildly calcified annulus. Trileaflet; mildly thickened leaflets. Valve area (VTI): 2.42 cm^2. Valve area (Vmax): 2.32 cm^2. - Atrial septum: No defect or patent foramen ovale was identified. - Technically difficult study. Echocontrast was used to enhance visualization.     05/2015 cath 1. Prox RCA to Mid RCA lesion, 20% stenosed. 2. Ost LAD to Prox LAD lesion, 30% stenosed. The lesion was previously treated with a stent (unknown type). 3. Dist Cx lesion, 50% stenosed.   Severe anterior apical hypokinesis. Estimated ejection fraction 35%. Normal left ventricular hemodynamics with EDP of 14 mmHg and mean capillary  wedge 17 mmHg.  Widely patent coronary arteries with 30% diffuse ISR in the proximal LAD stent. There is 50% eccentric stenosis in the distal circumflex. Luminal irregularities are noted in the right coronary.  Post cath RECOMMENDATIONS:   Aggressive risk factor modification to prevent progression of CAD  Aggressive heart failure therapy to minimize progression of LV dysfunction.   09/2015 echo Study Conclusions  - Left ventricle: The cavity size was normal. Wall thickness was normal. Systolic function was mildly reduced. The estimated ejection fraction was in the range of 45% to 50%. Doppler parameters are consistent with abnormal left ventricular relaxation (grade 1 diastolic dysfunction). - Regional wall motion abnormality: Hypokinesis of the mid anterior, mid anteroseptal, and apical myocardium. - Aortic valve: Mildly calcified annulus. Trileaflet; mildly thickened leaflets. Valve area (VTI): 3.07 cm^2. Valve area (Vmax): 2.7 cm^2. Valve area (Vmean): 2.7 cm^2. - Technically difficult study.  Assessment and Plan:  1. Chronic systolic heart failure ([place])   2. Benign essential HTN   3. Mixed hyperlipidemia   4. OSA (obstructive sleep apnea)    1. Chronic systolic heart failure [facility]) Last echocardiogram September 2017 EF had improved  to 45 to 50%.  Continue Entresto 49/51 mg p.o. twice daily.  Continue carvedilol 3.125 mg p.o. twice daily.  2. Benign essential HTN Blood pressure well controlled on current therapy.  Continue carvedilol 3.125 mg p.o. twice daily.  3. Mixed hyperlipidemia Lipid panel on 06/23/2019: TC 141, HDL 47, TG 154, LDL 70.  Continue atorvastatin 80 mg daily.  Continue aspirin 81 mg daily.  4. OSA (obstructive sleep apnea) Recent visit with Dr. Radford Pax in January 2021 and he was doing well on his CPAP and sleeping/resting much better.  Medication Adjustments/Labs and Tests Ordered: Current medicines are reviewed at length with the  patient today.  Concerns regarding medicines are outlined above.   Disposition: Follow-up with [facility] or [facility] 1 year.  Signed, Levell July, NP 12/03/2019 2:22 PM    [facility] [place], [place], [place] [address] Phone: [phone]; Fax: [phone]

## 2019-12-03 ENCOUNTER — Encounter: Payer: Self-pay | Admitting: Family Medicine

## 2019-12-03 ENCOUNTER — Ambulatory Visit (INDEPENDENT_AMBULATORY_CARE_PROVIDER_SITE_OTHER): Payer: BC Managed Care – PPO | Admitting: Family Medicine

## 2019-12-03 VITALS — BP 110/70 | HR 56 | Ht 70.0 in | Wt 275.0 lb

## 2019-12-03 DIAGNOSIS — I10 Essential (primary) hypertension: Secondary | ICD-10-CM

## 2019-12-03 DIAGNOSIS — G4733 Obstructive sleep apnea (adult) (pediatric): Secondary | ICD-10-CM | POA: Diagnosis not present

## 2019-12-03 DIAGNOSIS — I5022 Chronic systolic (congestive) heart failure: Secondary | ICD-10-CM

## 2019-12-03 DIAGNOSIS — E782 Mixed hyperlipidemia: Secondary | ICD-10-CM | POA: Diagnosis not present

## 2019-12-03 MED ORDER — ENTRESTO 49-51 MG PO TABS: 1.0000 | ORAL_TABLET | Freq: Two times a day (BID) | ORAL | 0 refills | Status: DC

## 2019-12-03 NOTE — Patient Instructions (Signed)

## 2019-12-11 ENCOUNTER — Telehealth: Payer: Self-pay | Admitting: Family Medicine

## 2019-12-11 NOTE — Telephone Encounter (Signed)
Patient called in regards to his medication not being covered by his insurance.

## 2019-12-14 NOTE — Telephone Encounter (Signed)
New message    Patient said that the entresto is no longer being covered by his insurance, is there something else he can take or can you call his insurance to get authorization , they say its not medically necessary

## 2019-12-14 NOTE — Telephone Encounter (Signed)
Insurance is no longer paying for [employer]

## 2019-12-14 NOTE — Telephone Encounter (Signed)
Spoke with [facility] and resubmitted prior authorization for Benjamin [name] - will receive decision via fax - reference # [id number]

## 2019-12-15 MED ORDER — ENTRESTO 49-51 MG PO TABS: 1.0000 | ORAL_TABLET | Freq: Two times a day (BID) | ORAL | 3 refills | Status: DC

## 2019-12-15 NOTE — Telephone Encounter (Signed)
Entresto approved through 12/13/2020 - pt made aware and refills sent to [facility] as requested

## 2019-12-23 DIAGNOSIS — H6123 Impacted cerumen, bilateral: Secondary | ICD-10-CM | POA: Diagnosis not present

## 2019-12-23 DIAGNOSIS — R42 Dizziness and giddiness: Secondary | ICD-10-CM | POA: Diagnosis not present

## 2020-03-15 ENCOUNTER — Other Ambulatory Visit: Payer: Self-pay | Admitting: Cardiology

## 2020-03-16 ENCOUNTER — Other Ambulatory Visit: Payer: Self-pay | Admitting: *Deleted

## 2020-03-16 MED ORDER — ATORVASTATIN CALCIUM 80 MG PO TABS: ORAL_TABLET | ORAL | 0 refills | Status: DC

## 2020-04-13 DIAGNOSIS — H6123 Impacted cerumen, bilateral: Secondary | ICD-10-CM | POA: Diagnosis not present

## 2020-04-13 DIAGNOSIS — H903 Sensorineural hearing loss, bilateral: Secondary | ICD-10-CM | POA: Diagnosis not present

## 2020-04-13 DIAGNOSIS — R42 Dizziness and giddiness: Secondary | ICD-10-CM | POA: Diagnosis not present

## 2020-05-06 ENCOUNTER — Encounter: Payer: Self-pay | Admitting: Nurse Practitioner

## 2020-05-06 ENCOUNTER — Other Ambulatory Visit: Payer: Self-pay | Admitting: *Deleted

## 2020-05-06 ENCOUNTER — Other Ambulatory Visit: Payer: Self-pay

## 2020-05-06 ENCOUNTER — Ambulatory Visit (INDEPENDENT_AMBULATORY_CARE_PROVIDER_SITE_OTHER): Payer: BC Managed Care – PPO | Admitting: Nurse Practitioner

## 2020-05-06 VITALS — Temp 98.2°F | Ht 70.0 in | Wt 260.4 lb

## 2020-05-06 DIAGNOSIS — G609 Hereditary and idiopathic neuropathy, unspecified: Secondary | ICD-10-CM | POA: Diagnosis not present

## 2020-05-06 DIAGNOSIS — R42 Dizziness and giddiness: Secondary | ICD-10-CM

## 2020-05-06 MED ORDER — GABAPENTIN 300 MG PO CAPS: 300.0000 mg | ORAL_CAPSULE | Freq: Three times a day (TID) | ORAL | 3 refills | Status: DC | PRN

## 2020-05-06 NOTE — Progress Notes (Signed)
Subjective:    Patient ID: Benjamin [name], male    DOB: 05/26/1950, 70 y.o.   MRN: [id number]  HPI: Benjamin [name] is a 70 y.o. male presenting for dizziness.  Chief Complaint  Patient presents with  . Dizziness    Went to ENT a couple of wks ago, have been having vertigo and lightheadedness since.   . Pain    Asking for a stronger nerve pain med, currently on the gabapentin, not working for the pain   DIZZINESS Saw Dr. Benjamine Mola on 4/13; he reports he gets his ears flushed every 6 months with him and this does help some with his dizziness.  His dizziness is unchanged from his baseline which has been going on for more than 1 year. Wife was sick earlier this week with stomach bug.  Thinks he may have caught what she got.     Duration: years Description of symptoms: lightheaded Duration of episode: minutes Dizziness frequency: recurrent Aggravating/provoking factors:  Changing positions, getting back into bed on left side Triggered by rolling over in bed: no Triggered by bending over: yes Aggravated by head movement: no Aggravated by exertion: no Aggravated by coughing: no Aggravated by loud noises: no Recent head injury: no Recent or current viral symptoms: no History of vasovagal episodes: no Nausea: no  Decreased appetite: yes Diarrhea: yes; 4 times today; started today - non bloody "brown water" Body aches: yes Vomiting: no Tinnitus: yes; not new Hearing loss: no Aural fullness: no Headache: no Photophobia: no Phonophobia: no Unsteady gait: yes; not new Postural instability: yes; no falls and not new Diplopia: no Dysarthria: no Dysphagia: no Weakness: no Related to exertion: no Pallor: no Diaphoresis: no Dyspnea: no Chest pain: no   PRE-DIABETES Last A1c June 2021 was 6.3%.  He does not currently take anything for diabetes.  He is complaining of worsening neuropathy and is requesting a stronger medication for this.  Hypoglycemic  episodes:no Polydipsia/polyuria: no Visual disturbance: no Chest pain: no Paresthesias: yes Glucose Monitoring: no Taking Insulin?: no Retinal Examination: Not up to Date Foot Exam: Up to Date Diabetic Education: Completed Pneumovax: Up to Date Influenza: Not up to Date Aspirin: yes   Allergies  Allergen Reactions  . Penicillins Swelling    Outpatient Encounter Medications as of 05/06/2020  Medication Sig  . aspirin 81 MG tablet Take 81 mg by mouth daily. Reported on 05/05/2015  . atorvastatin (LIPITOR) 80 MG tablet TAKE 1 TABLET BY MOUTH DAILY AT 6PM IN THE EVENING  . carvedilol (COREG) 3.125 MG tablet TAKE ONE TABLET BY MOUTH TWICE A DAY  . Cholecalciferol (VITAMIN D) 2000 UNITS CAPS Take 1 capsule by mouth daily.   . sacubitril-valsartan (ENTRESTO) 49-51 MG Take 1 tablet by mouth 2 (two) times daily.  . [DISCONTINUED] gabapentin (NEURONTIN) 300 MG capsule Take 1 capsule (300 mg total) by mouth 3 (three) times daily as needed (NERVE PAIN).   No facility-administered encounter medications on file as of 05/06/2020.    Patient Active Problem List   Diagnosis Date Noted  . OSA (obstructive sleep apnea) 11/02/2015  . Benign essential HTN 11/02/2015  . Obesity (BMI 30-39.9) 11/02/2015  . Chronic systolic heart failure ([place]) 05/19/2015  . CAD in native artery 05/19/2015  . ASCVD (arteriosclerotic cardiovascular disease)   . Allergy   . Myocardial infarction ([place]) 04/20/1999    Past Medical History:  Diagnosis Date  . Allergy   . ASCVD (arteriosclerotic cardiovascular disease)   . Hyperlipidemia   . Hypertension   .  Myocardial infarction ([facility]) 04/20/1999  . Obesity (BMI 30-39.9)   . OSA (obstructive sleep apnea) 11/02/2015    Relevant past medical, surgical, family and social history reviewed and updated as indicated. Interim medical history since our last visit reviewed.  Review of Systems Per HPI unless specifically indicated above     Objective:    Temp 98.2 F  (36.8 C)   Ht 5\' 10"  (1.778 m)   Wt 260 lb 6.4 oz (118.1 kg)   BMI 37.36 kg/m   Wt Readings from Last 3 Encounters:  05/06/20 260 lb 6.4 oz (118.1 kg)  12/03/19 275 lb (124.7 kg)  06/23/19 270 lb (122.5 kg)    Physical Exam Vitals and nursing note reviewed.  Constitutional:      General: He is not in acute distress.    Appearance: Normal appearance. He is obese. He is not toxic-appearing.  HENT:     Head: Normocephalic and atraumatic.     Right Ear: Tympanic membrane normal.     Left Ear: Tympanic membrane normal.     Ears:     Comments: Bilateral ceruminosis without impaction    Nose: Nose normal. No congestion.  Eyes:     General: No scleral icterus.       Right eye: No discharge.        Left eye: No discharge.     Extraocular Movements: Extraocular movements intact.     Pupils: Pupils are equal, round, and reactive to light.  Cardiovascular:     Rate and Rhythm: Normal rate and regular rhythm.     Heart sounds: Normal heart sounds. No murmur heard.   Pulmonary:     Effort: Pulmonary effort is normal. No respiratory distress.     Breath sounds: Normal breath sounds. No wheezing, rhonchi or rales.  Abdominal:     General: Abdomen is flat. Bowel sounds are normal.     Palpations: Abdomen is soft.  Musculoskeletal:        General: Normal range of motion.     Cervical back: Normal range of motion.     Right lower leg: No edema.     Left lower leg: No edema.  Feet:     Right foot:     Toenail Condition: Right toenails are abnormally thick and long.     Left foot:     Toenail Condition: Left toenails are abnormally thick and long.     Comments: Bilateral great toe nails dark in color Skin:    General: Skin is warm and dry.     Capillary Refill: Capillary refill takes less than 2 seconds.     Coloration: Skin is not jaundiced or pale.     Findings: No erythema.  Neurological:     General: No focal deficit present.     Mental Status: He is alert and oriented to  person, place, and time.     Sensory: No sensory deficit.     Motor: No weakness.     Coordination: Coordination normal.     Gait: Gait normal.  Psychiatric:        Mood and Affect: Mood normal.        Behavior: Behavior normal.        Thought Content: Thought content normal.        Judgment: Judgment normal.       Assessment & Plan:  1. Idiopathic peripheral neuropathy Chronic.  Overdue for A1c recheck - patient wishes to defer labs until when fasting and will  schedule with PCP for next week to discuss labs. In meantime, can increase gabapentin to twice daily and monitor for benefit.  Encouraged getting fitted for tennis shoes given subungal hematomas and consider referral to Podiatrist in future.  2. Dizziness Chronic.  It sounds like patient's baseline dizziness involves inner ear and he is following with ENT already.  Regarding the decreased appetite and diarrhea, suspect GI virus given his wife was sick with similar earlier this week.  Slight dehydration likely cause of blood pressure changes.  Encouraged plenty of hydration with sugar free gatorade, soft foods as tolerated.  Follow up if symptoms not improving.  With any chest pain, shortness of breath, or passing out, go to ER.  If unable to keep fluids down >24 hours, go to ER.    Follow up plan: Return in about 2 weeks (around 05/20/2020).

## 2020-05-06 NOTE — Patient Instructions (Addendum)
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  Will refill gabapentin today.

## 2020-05-13 ENCOUNTER — Encounter: Payer: Self-pay | Admitting: Family Medicine

## 2020-05-13 ENCOUNTER — Ambulatory Visit (INDEPENDENT_AMBULATORY_CARE_PROVIDER_SITE_OTHER): Payer: BC Managed Care – PPO | Admitting: Family Medicine

## 2020-05-13 ENCOUNTER — Other Ambulatory Visit: Payer: Self-pay

## 2020-05-13 VITALS — BP 102/48 | HR 60 | Temp 97.6°F | Resp 14 | Ht 70.0 in | Wt 258.0 lb

## 2020-05-13 DIAGNOSIS — R7303 Prediabetes: Secondary | ICD-10-CM | POA: Diagnosis not present

## 2020-05-13 DIAGNOSIS — I2510 Atherosclerotic heart disease of native coronary artery without angina pectoris: Secondary | ICD-10-CM

## 2020-05-13 DIAGNOSIS — I10 Essential (primary) hypertension: Secondary | ICD-10-CM | POA: Diagnosis not present

## 2020-05-13 DIAGNOSIS — Z125 Encounter for screening for malignant neoplasm of prostate: Secondary | ICD-10-CM | POA: Diagnosis not present

## 2020-05-13 DIAGNOSIS — I5022 Chronic systolic (congestive) heart failure: Secondary | ICD-10-CM | POA: Diagnosis not present

## 2020-05-13 NOTE — Progress Notes (Signed)
Subjective:    Patient ID: Benjamin [name], male    DOB: 05-07-1950, 70 y.o.   MRN: [id number]  HPI Patient is a very pleasant 70 year old male who presents today for a checkup.  He states at times he feels lightheaded.  He has been dealing with vertigo however the lightheadedness is different.  At times he feels extremely woozy like he could pass out.  His blood pressure today is extremely low.  He denies any chest pain shortness of breath or dyspnea on exertion.  He denies any polyuria polydipsia or blurry vision.  He is overdue for prostate cancer screening as well.  He denies any myalgias or right upper quadrant pain.  Otherwise, patient states has been feeling good.  He denies any specific complaints aside from the lightheadedness at times.  He does continue to have some neuropathic type pain in his legs.  However he has severe varicose veins.  He is not currently taking anything for varicose veins but he is taking gabapentin for peripheral neuropathy    Past Medical History:  Diagnosis Date  . Allergy   . ASCVD (arteriosclerotic cardiovascular disease)   . Hyperlipidemia   . Hypertension   . Myocardial infarction ([place]) 04/20/1999  . Obesity (BMI 30-39.9)   . OSA (obstructive sleep apnea) 11/02/2015   Past Surgical History:  Procedure Laterality Date  . CARDIAC CATHETERIZATION N/A 05/19/2015   Procedure: Right/Left Heart Cath and Coronary Angiography;  Surgeon: Belva Crome, MD;  Location: [facility];  Service: Cardiovascular;  Laterality: N/A;  . CORONARY STENT PLACEMENT  04/18/1999   Current Outpatient Medications on File Prior to Visit  Medication Sig Dispense Refill  . aspirin 81 MG tablet Take 81 mg by mouth daily. Reported on 05/05/2015    . atorvastatin (LIPITOR) 80 MG tablet TAKE 1 TABLET BY MOUTH DAILY AT 6PM IN THE EVENING 90 tablet 0  . carvedilol (COREG) 3.125 MG tablet TAKE ONE TABLET BY MOUTH TWICE A DAY 180 tablet 3  . Cholecalciferol (VITAMIN D) 2000 UNITS CAPS  Take 1 capsule by mouth daily.     Marland Kitchen gabapentin (NEURONTIN) 300 MG capsule Take 1 capsule (300 mg total) by mouth 3 (three) times daily as needed (NERVE PAIN). 90 capsule 3  . sacubitril-valsartan (ENTRESTO) 49-51 MG Take 1 tablet by mouth 2 (two) times daily. 60 tablet 3   No current facility-administered medications on file prior to visit.   Allergies  Allergen Reactions  . Penicillins Swelling   Social History   Socioeconomic History  . Marital status: Married    Spouse name: Not on file  . Number of children: Not on file  . Years of education: Not on file  . Highest education level: Not on file  Occupational History  . Not on file  Tobacco Use  . Smoking status: Former Smoker    Packs/day: 0.50    Years: 8.00    Pack years: 4.00    Types: Cigarettes    Start date: 07/24/1966    Quit date: 07/24/1974    Years since quitting: 45.8  . Smokeless tobacco: Never Used  Substance and Sexual Activity  . Alcohol use: No    Alcohol/week: 0.0 standard drinks  . Drug use: No  . Sexual activity: Yes    Birth control/protection: None  Other Topics Concern  . Not on file  Social History Narrative  . Not on file   Social Determinants of Health   Financial Resource Strain: Not on file  Food Insecurity: Not on file  Transportation Needs: Not on file  Physical Activity: Not on file  Stress: Not on file  Social Connections: Not on file  Intimate Partner Violence: Not on file     Review of Systems  All other systems reviewed and are negative.      Objective:   Physical Exam Vitals reviewed.  Constitutional:      Appearance: He is well-developed.  HENT:     Right Ear: Tympanic membrane and ear canal normal.     Left Ear: Tympanic membrane and ear canal normal.     Nose: No mucosal edema or rhinorrhea.     Right Sinus: No maxillary sinus tenderness or frontal sinus tenderness.     Left Sinus: No maxillary sinus tenderness or frontal sinus tenderness.     Mouth/Throat:      Pharynx: No oropharyngeal exudate.  Eyes:     Conjunctiva/sclera: Conjunctivae normal.     Pupils: Pupils are equal, round, and reactive to light.  Cardiovascular:     Rate and Rhythm: Normal rate and regular rhythm.     Heart sounds: Normal heart sounds. No murmur heard.   Pulmonary:     Effort: Pulmonary effort is normal. No respiratory distress.     Breath sounds: Normal breath sounds. No wheezing or rales.  Musculoskeletal:     Cervical back: Neck supple.  Lymphadenopathy:     Cervical: No cervical adenopathy.           Assessment & Plan:  CAD in native artery - Plan: CBC with Differential/Platelet, COMPLETE METABOLIC PANEL WITH GFR, Lipid panel  Prediabetes - Plan: Hemoglobin P8K  Chronic systolic heart failure (HCC)  Benign essential HTN  Prostate cancer screening - Plan: PSA  I certainly think the patient has peripheral neuropathy but I also believe the varicose veins could be playing a role in some of his aching and throbbing leg pain.  I recommended trying compression hose as well as horse Chestnut seed extract to see if it will help.  Check a hemoglobin A1c today to monitor his prediabetes.  Blood pressure is very low.  I believe this could be contributing to some of his lightheadedness.  I recommended he contact his cardiologist and see if they feel he may benefit from reducing his dose of Entresto.  Check a CMP and a lipid panel.  Goal LDL cholesterol is less than 70 given his history of coronary artery disease

## 2020-05-14 LAB — CBC WITH DIFFERENTIAL/PLATELET
Absolute Monocytes: 624 cells/uL (ref 200–950)
Basophils Absolute: 50 cells/uL (ref 0–200)
Basophils Relative: 0.5 %
Eosinophils Absolute: 287 cells/uL (ref 15–500)
Eosinophils Relative: 2.9 %
HCT: 47.2 % (ref 38.5–50.0)
Hemoglobin: 16 g/dL (ref 13.2–17.1)
Lymphs Abs: 3683 cells/uL (ref 850–3900)
MCH: 28.8 pg (ref 27.0–33.0)
MCHC: 33.9 g/dL (ref 32.0–36.0)
MCV: 84.9 fL (ref 80.0–100.0)
MPV: 10.1 fL (ref 7.5–12.5)
Monocytes Relative: 6.3 %
Neutro Abs: 5257 cells/uL (ref 1500–7800)
Neutrophils Relative %: 53.1 %
Platelets: 254 10*3/uL (ref 140–400)
RBC: 5.56 10*6/uL (ref 4.20–5.80)
RDW: 13.1 % (ref 11.0–15.0)
Total Lymphocyte: 37.2 %
WBC: 9.9 10*3/uL (ref 3.8–10.8)

## 2020-05-14 LAB — COMPLETE METABOLIC PANEL WITH GFR
AG Ratio: 1.8 (calc) (ref 1.0–2.5)
ALT: 18 U/L (ref 9–46)
AST: 22 U/L (ref 10–35)
Albumin: 4.3 g/dL (ref 3.6–5.1)
Alkaline phosphatase (APISO): 88 U/L (ref 35–144)
BUN: 14 mg/dL (ref 7–25)
CO2: 26 mmol/L (ref 20–32)
Calcium: 9.4 mg/dL (ref 8.6–10.3)
Chloride: 104 mmol/L (ref 98–110)
Creat: 1.13 mg/dL (ref 0.70–1.18)
GFR, Est African American: 76 mL/min/{1.73_m2} (ref >=60)
GFR, Est Non African American: 65 mL/min/{1.73_m2} (ref >=60)
Globulin: 2.4 g/dL (calc) (ref 1.9–3.7)
Glucose, Bld: 93 mg/dL (ref 65–99)
Potassium: 4.7 mmol/L (ref 3.5–5.3)
Sodium: 140 mmol/L (ref 135–146)
Total Bilirubin: 0.8 mg/dL (ref 0.2–1.2)
Total Protein: 6.7 g/dL (ref 6.1–8.1)

## 2020-05-14 LAB — LIPID PANEL
Cholesterol: 117 mg/dL (ref <200)
HDL: 47 mg/dL (ref >=40)
LDL Cholesterol (Calc): 55 mg/dL (calc)
Non-HDL Cholesterol (Calc): 70 mg/dL (calc) (ref <130)
Total CHOL/HDL Ratio: 2.5 (calc) (ref <5.0)
Triglycerides: 74 mg/dL (ref <150)

## 2020-05-14 LAB — HEMOGLOBIN A1C
Hgb A1c MFr Bld: 6.2 % of total Hgb — ABNORMAL HIGH (ref <5.7)
Mean Plasma Glucose: 131 mg/dL
eAG (mmol/L): 7.3 mmol/L

## 2020-05-14 LAB — PSA: PSA: 1.22 ng/mL (ref <=4.00)

## 2020-05-16 IMAGING — CT CT TEMPORAL BONES W/O CM
4 of 8 series · 9 of 30 positions shown, 10 images · non-contrast
Comparison: No prior head imaging.

CLINICAL DATA: 68-year-old male with right side conductive hearing
loss [REDACTED]. Wax buildup.

EXAM:
CT TEMPORAL BONES WITHOUT CONTRAST
TECHNIQUE: Axial and coronal plane CT imaging of the petrous temporal bones was
performed with thin-collimation image reconstruction. No intravenous
contrast was administered. Multiplanar CT image reconstructions were
also generated.

[Series 1008: cor rt · coronal · 0.20mm/px · 2 of 194 slices shown]
[im 65/194  bone]
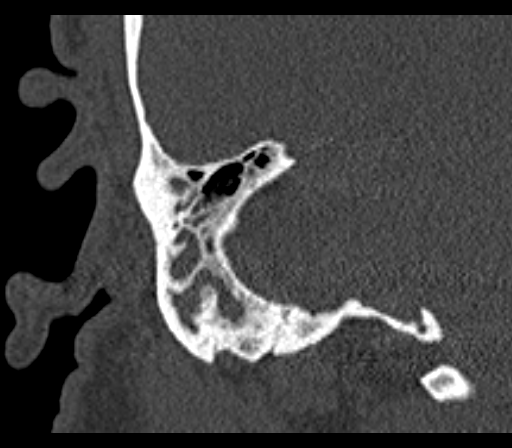
[im 129/194  bone]
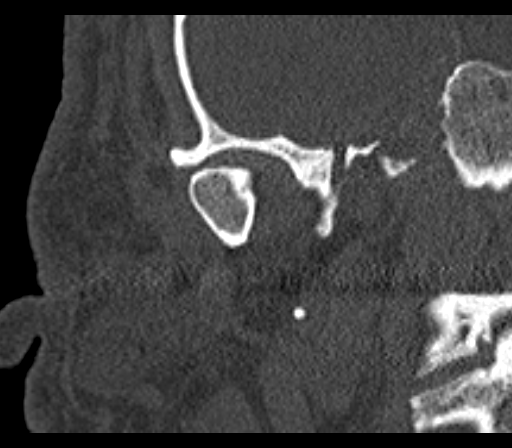

[Series 1024: left axial mag · axial · 0.20mm/px · z∈[-616,-436]mm · 3 of 167 slices shown, 4 images]
[im 1/167  brain]
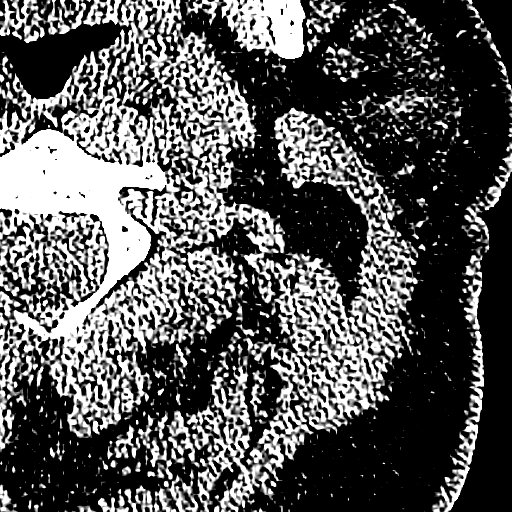
[im 1/167  bone]
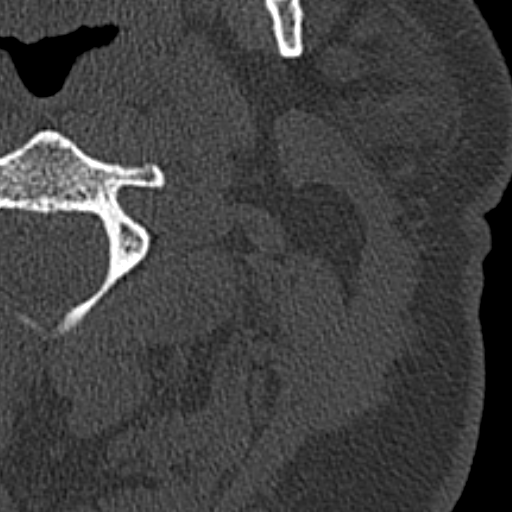
[im 84/167  bone]
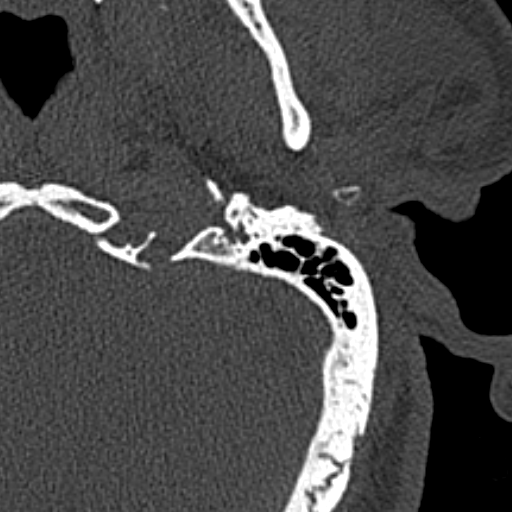
[im 167/167  bone]
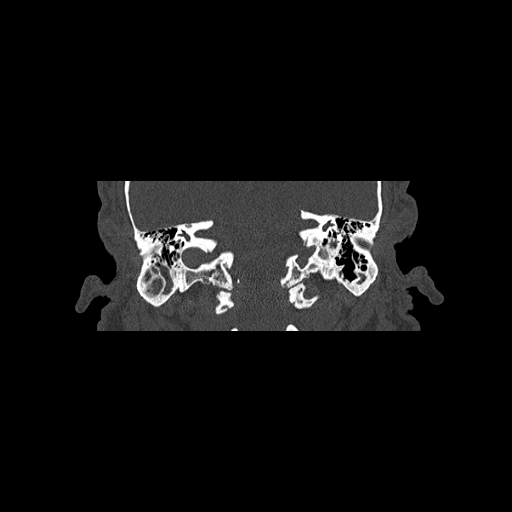

[Series 1030: cor rt mag · coronal · 0.20mm/px · 2 of 215 slices shown]
[im 72/215  bone]
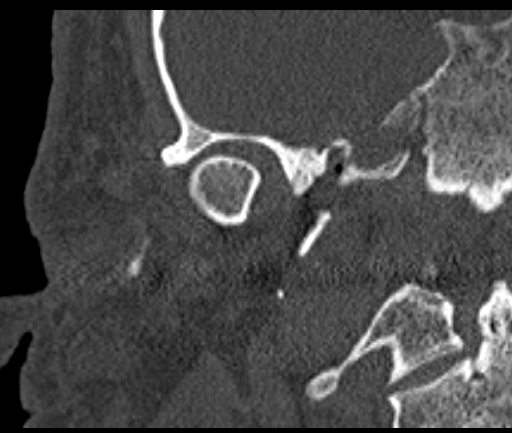
[im 143/215  bone]
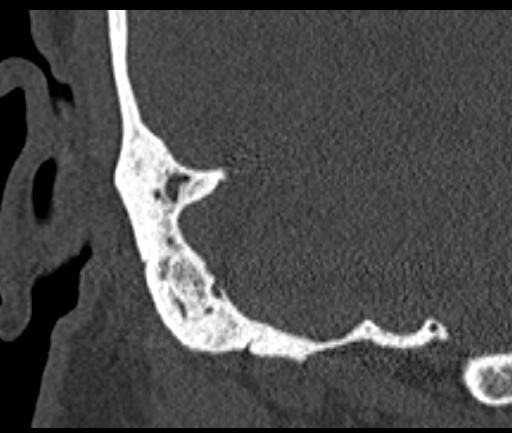

[Series 1034: cor left mag · coronal · 0.20mm/px · 2 of 215 slices shown]
[im 72/215  bone]
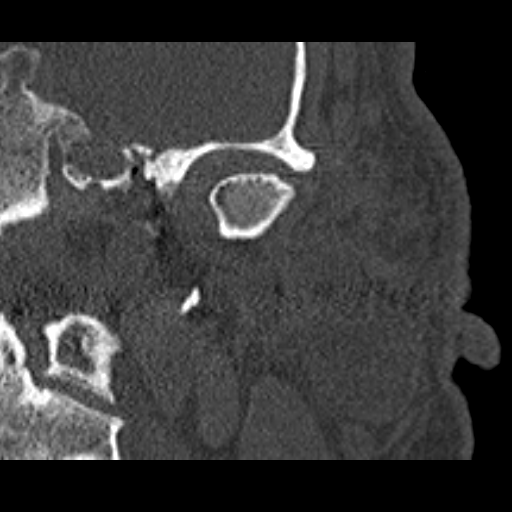
[im 143/215  bone]
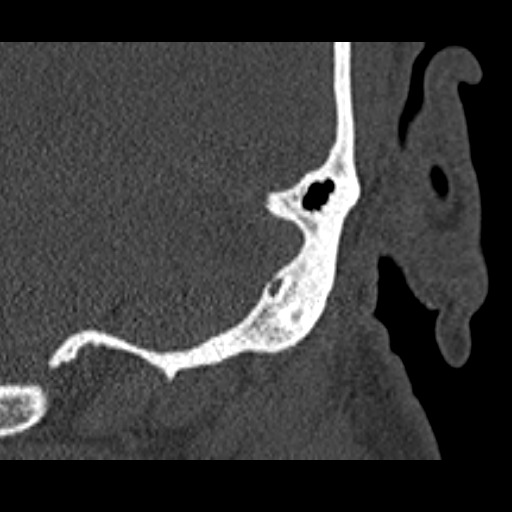

[9 of 30 positions shown; findings below may reference images not displayed]

FINDINGS: Mild Calcified atherosclerosis at the skull base. Negative visible
noncontrast brain parenchyma. Negative visible noncontrast deep soft
tissue spaces of the face.

Visible paranasal sinuses are well pneumatized. In general bone
mineralization at the skull base is normal.

LEFT TEMPORAL BONE:

Mild soft tissue thickening and/or debris in the left EAC (series
1001, image 112). Associated mild thickening of the left tympanic
membrane (series 4622, image 114). The left tympanic cavity is
clear. The ossicles appear intact and normally aligned. The scutum
appears intact.

The left mastoid antrum and air cells are clear.

Left IAC, cochlea, vestibule and vestibular aqueduct are within
normal limits. There is mild thinning of bone over the left superior
semicircular canal on series 6561, image 91. Otherwise negative left
semicircular canals. Negative course of the left 7th nerve.

RIGHT TEMPORAL BONE:

Soft tissue thickening and/or debris in the right EAC (series 9447,
image 97 and series 0040, image 106). Mild thickening of the right
tympanic membrane similar to the left side.

The right tympanic cavity is clear. The right ossicles appear intact
and normally aligned. The right scutum is intact.

The right mastoid antrum is clear but there is opacification of
right mastoid air cells, a especially inferiorly and posteriorly
(series 9447, image 81). Possible coalescence of the most inferior
air cells (image 83). But otherwise no bone erosion. Soft tissues at
the mastoid tip appear to remain normal.

Right IAC, cochlea, vestibule and vestibular aqueduct are within
normal limits. Negative right semicircular canals. Negative course
of the right 7th nerve.
IMPRESSION: 1. Soft tissue thickening and/or debris in the bilateral EAC greater
on the right.
2. Bilateral mild thickening of the tympanic membranes.
3. Normally Pneumatized right tympanic cavity and mastoid antrum,
but opacified posterior and inferior right mastoid air cells, with
possible coalescence of some inferior air cells.
But no other bone erosion and no soft tissue inflammation at the
mastoid tip.
4. Left tympanic cavity and mastoids are clear. Mild thinning of
bone over the left superior semicircular canal.

## 2020-06-20 ENCOUNTER — Encounter (HOSPITAL_BASED_OUTPATIENT_CLINIC_OR_DEPARTMENT_OTHER): Payer: Self-pay | Admitting: Obstetrics and Gynecology

## 2020-06-20 ENCOUNTER — Emergency Department (HOSPITAL_BASED_OUTPATIENT_CLINIC_OR_DEPARTMENT_OTHER): Payer: BC Managed Care – PPO

## 2020-06-20 ENCOUNTER — Emergency Department (HOSPITAL_BASED_OUTPATIENT_CLINIC_OR_DEPARTMENT_OTHER)
Admission: EM | Admit: 2020-06-20 | Discharge: 2020-06-20 | Disposition: A | Discharge status: Home / Self Care | Payer: BC Managed Care – PPO | Attending: Emergency Medicine | Admitting: Emergency Medicine

## 2020-06-20 ENCOUNTER — Other Ambulatory Visit: Payer: Self-pay

## 2020-06-20 DIAGNOSIS — Z955 Presence of coronary angioplasty implant and graft: Secondary | ICD-10-CM | POA: Insufficient documentation

## 2020-06-20 DIAGNOSIS — K8020 Calculus of gallbladder without cholecystitis without obstruction: Secondary | ICD-10-CM | POA: Insufficient documentation

## 2020-06-20 DIAGNOSIS — E785 Hyperlipidemia, unspecified: Secondary | ICD-10-CM | POA: Insufficient documentation

## 2020-06-20 DIAGNOSIS — I10 Essential (primary) hypertension: Secondary | ICD-10-CM | POA: Insufficient documentation

## 2020-06-20 DIAGNOSIS — I110 Hypertensive heart disease with heart failure: Secondary | ICD-10-CM | POA: Insufficient documentation

## 2020-06-20 DIAGNOSIS — I2510 Atherosclerotic heart disease of native coronary artery without angina pectoris: Secondary | ICD-10-CM | POA: Diagnosis not present

## 2020-06-20 DIAGNOSIS — E1169 Type 2 diabetes mellitus with other specified complication: Secondary | ICD-10-CM | POA: Insufficient documentation

## 2020-06-20 DIAGNOSIS — I5022 Chronic systolic (congestive) heart failure: Secondary | ICD-10-CM | POA: Insufficient documentation

## 2020-06-20 DIAGNOSIS — Z7982 Long term (current) use of aspirin: Secondary | ICD-10-CM | POA: Insufficient documentation

## 2020-06-20 DIAGNOSIS — Z87891 Personal history of nicotine dependence: Secondary | ICD-10-CM | POA: Diagnosis not present

## 2020-06-20 DIAGNOSIS — R1013 Epigastric pain: Secondary | ICD-10-CM | POA: Diagnosis not present

## 2020-06-20 DIAGNOSIS — Z79899 Other long term (current) drug therapy: Secondary | ICD-10-CM | POA: Insufficient documentation

## 2020-06-20 LAB — CBC
HCT: 47.6 % (ref 39.0–52.0)
Hemoglobin: 15.6 g/dL (ref 13.0–17.0)
MCH: 28.4 pg (ref 26.0–34.0)
MCHC: 32.8 g/dL (ref 30.0–36.0)
MCV: 86.7 fL (ref 80.0–100.0)
Platelets: 227 10*3/uL (ref 150–400)
RBC: 5.49 MIL/uL (ref 4.22–5.81)
RDW: 14 % (ref 11.5–15.5)
WBC: 12.9 10*3/uL — ABNORMAL HIGH (ref 4.0–10.5)
nRBC: 0 % (ref 0.0–0.2)

## 2020-06-20 LAB — COMPREHENSIVE METABOLIC PANEL
ALT: 19 U/L (ref 0–44)
AST: 18 U/L (ref 15–41)
Albumin: 4.3 g/dL (ref 3.5–5.0)
Alkaline Phosphatase: 90 U/L (ref 38–126)
Anion gap: 10 (ref 5–15)
BUN: 14 mg/dL (ref 8–23)
CO2: 27 mmol/L (ref 22–32)
Calcium: 9.1 mg/dL (ref 8.9–10.3)
Chloride: 103 mmol/L (ref 98–111)
Creatinine, Ser: 1.25 mg/dL — ABNORMAL HIGH (ref 0.61–1.24)
GFR, Estimated: >60 mL/min (ref >60)
Glucose, Bld: 148 mg/dL — ABNORMAL HIGH (ref 70–99)
Potassium: 4.4 mmol/L (ref 3.5–5.1)
Sodium: 140 mmol/L (ref 135–145)
Total Bilirubin: 0.7 mg/dL (ref 0.3–1.2)
Total Protein: 7.1 g/dL (ref 6.5–8.1)

## 2020-06-20 LAB — URINALYSIS, ROUTINE W REFLEX MICROSCOPIC
Bilirubin Urine: NEGATIVE
Glucose, UA: NEGATIVE mg/dL
Hgb urine dipstick: NEGATIVE
Ketones, ur: NEGATIVE mg/dL
Leukocytes,Ua: NEGATIVE
Nitrite: NEGATIVE
Specific Gravity, Urine: 1.025 (ref 1.005–1.030)
pH: 5.5 (ref 5.0–8.0)

## 2020-06-20 LAB — LIPASE, BLOOD: Lipase: 31 U/L (ref 11–51)

## 2020-06-20 MED ORDER — IOHEXOL 300 MG/ML  SOLN
100.0000 mL | Freq: Once | INTRAMUSCULAR | Status: AC | PRN
  Administered 2020-06-20: 100 mL via INTRAVENOUS

## 2020-06-20 NOTE — ED Triage Notes (Signed)
Patient reports to the ER for periumbilical abdominal pain. Patient reports he had a hernia as a child but otherwise no abdominal problems. Patient reports the pain is radiating to his back. Patient denies N/V/D.

## 2020-06-20 NOTE — ED Provider Notes (Addendum)
[facility] EMERGENCY DEPT Provider Note   CSN: [id number] Arrival date & time: 06/20/20  2051     History Chief Complaint  Patient presents with   Abdominal Pain    Benjamin [name] is a 70 y.o. male.   Abdominal Pain Associated symptoms: no chest pain, no constipation, no diarrhea, no shortness of breath and no vomiting   Patient presents with upper abdominal pain.  Began acutely at around 5:00 today.  Somewhat dull.  No nausea or vomiting.  No diarrhea.  States has not had decreased appetite.  No fevers.  States the pain now appears to go to the back but still is on the front.  Has not had pains like this before.  Did power washing of his shed today but no injury.    Past Medical History:  Diagnosis Date   Allergy    ASCVD (arteriosclerotic cardiovascular disease)    Diabetes mellitus without complication ([place])    prediabetes   Hyperlipidemia    Hypertension    Myocardial infarction ([place]) 04/20/1999   Obesity (BMI 30-39.9)    OSA (obstructive sleep apnea) 11/02/2015    Patient Active Problem List   Diagnosis Date Noted   OSA (obstructive sleep apnea) 11/02/2015   Benign essential HTN 11/02/2015   Obesity (BMI 30-39.9) 78/24/2353   Chronic systolic heart failure ([place]) 05/19/2015   CAD in native artery 05/19/2015   ASCVD (arteriosclerotic cardiovascular disease)    Allergy    Myocardial infarction ([facility]) 04/20/1999    Past Surgical History:  Procedure Laterality Date   CARDIAC CATHETERIZATION N/A 05/19/2015   Procedure: Right/Left Heart Cath and Coronary Angiography;  Surgeon: Belva Crome, MD;  Location: [facility];  Service: Cardiovascular;  Laterality: N/A;   CORONARY STENT PLACEMENT  04/18/1999       Family History  Problem Relation Age of Onset   AAA (abdominal aortic aneurysm) Father     Social History   Tobacco Use   Smoking status: Former    Packs/day: 0.50    Years: 8.00    Pack years: 4.00    Types: Cigarettes    Start  date: 07/24/1966    Quit date: 07/24/1974    Years since quitting: 45.9    Passive exposure: Past   Smokeless tobacco: Never  Vaping Use   Vaping Use: Never used  Substance Use Topics   Alcohol use: No    Alcohol/week: 0.0 standard drinks   Drug use: No    Home Medications Prior to Admission medications   Medication Sig Start Date End Date Taking? Authorizing Provider  aspirin 81 MG tablet Take 81 mg by mouth daily. Reported on 05/05/2015   Yes [provider]  atorvastatin (LIPITOR) 80 MG tablet TAKE 1 TABLET BY MOUTH DAILY AT 6PM IN THE EVENING 03/16/20  Yes Susy Frizzle, MD  carvedilol (COREG) 3.125 MG tablet TAKE ONE TABLET BY MOUTH TWICE A DAY 03/15/20  Yes Arnoldo Lenis, MD  Cholecalciferol (VITAMIN D) 2000 UNITS CAPS Take 1 capsule by mouth daily.    Yes [provider]  sacubitril-valsartan (ENTRESTO) 49-51 MG Take 1 tablet by mouth 2 (two) times daily. 12/15/19  Yes Branch, Alphonse Guild, MD  gabapentin (NEURONTIN) 300 MG capsule Take 1 capsule (300 mg total) by mouth 3 (three) times daily as needed (NERVE PAIN). 05/06/20   Susy Frizzle, MD    Allergies    Penicillins  Review of Systems   Review of Systems  Constitutional:  Negative for  appetite change.  HENT:  Negative for congestion.   Respiratory:  Negative for shortness of breath.   Cardiovascular:  Negative for chest pain.  Gastrointestinal:  Positive for abdominal pain. Negative for constipation, diarrhea and vomiting.  Genitourinary:  Negative for flank pain.  Musculoskeletal:  Positive for back pain.  Skin:  Negative for pallor.  Neurological:  Negative for weakness.  Psychiatric/Behavioral:  Negative for confusion.    Physical Exam Updated Vital Signs BP 121/84 (BP Location: Left Arm)   Pulse (!) 55   Temp 98.3 F (36.8 C) (Oral)   Resp 19   Ht 5\' 10"  (1.778 m)   Wt 117 kg   SpO2 95%   BMI 37.02 kg/m   Physical Exam Vitals and nursing note reviewed.  Eyes:     Pupils:  Pupils are equal, round, and reactive to light.  Cardiovascular:     Rate and Rhythm: Normal rate and regular rhythm.  Pulmonary:     Breath sounds: Normal breath sounds.  Abdominal:     Hernia: No hernia is present.     Comments: Epigastric abdominal tenderness.  No rebound or guarding.  No hernia palpated.  Somewhat obese.  No pulsatile mass.  Skin:    General: Skin is warm.     Capillary Refill: Capillary refill takes less than 2 seconds.  Neurological:     Mental Status: He is alert and oriented to person, place, and time.    ED Results / Procedures / Treatments   Labs (all labs ordered are listed, but only abnormal results are displayed) Labs Reviewed  COMPREHENSIVE METABOLIC PANEL - Abnormal; Notable for the following components:      Result Value   Glucose, Bld 148 (*)    Creatinine, Ser 1.25 (*)    All other components within normal limits  CBC - Abnormal; Notable for the following components:   WBC 12.9 (*)    All other components within normal limits  URINALYSIS, ROUTINE W REFLEX MICROSCOPIC - Abnormal; Notable for the following components:   Protein, ur TRACE (*)    All other components within normal limits  LIPASE, BLOOD    EKG None  Radiology CT ABDOMEN PELVIS W CONTRAST  Result Date: 06/20/2020 CLINICAL DATA:  Epigastric pain. EXAM: CT ABDOMEN AND PELVIS WITH CONTRAST TECHNIQUE: Multidetector CT imaging of the abdomen and pelvis was performed using the standard protocol following bolus administration of intravenous contrast. CONTRAST:  16mL OMNIPAQUE IOHEXOL 300 MG/ML  SOLN COMPARISON:  None. FINDINGS: Lower chest: No acute abnormality. Hepatobiliary: No focal liver abnormality. Tiny stone within the gallbladder neck measures 3 mm. No signs of gallbladder wall inflammation. No bile duct dilatation. Pancreas: Unremarkable. No pancreatic ductal dilatation or surrounding inflammatory changes. Spleen: Normal in size without focal abnormality. Adrenals/Urinary Tract:  Normal adrenal glands. No kidney mass or hydronephrosis identified. Urinary bladder unremarkable. Stomach/Bowel: Stomach appears normal. The appendix is visualized and is within normal limits. No signs of bowel wall thickening, inflammation, or distension. Vascular/Lymphatic: Aortic atherosclerosis. No aneurysm. No signs of abdominopelvic adenopathy. Reproductive: Prostate gland enlargement. Other: No free fluid or fluid collection. Fat containing left inguinal hernia. Musculoskeletal: Degenerative disc disease is identified within the thoracolumbar spine. No acute or suspicious osseous findings. IMPRESSION: 1. No acute findings within the abdomen or pelvis. 2. Gallstone. 3. Fat containing left inguinal hernia. 4. Aortic atherosclerosis. Aortic Atherosclerosis (ICD10-I70.0). Electronically Signed   By: [name] M.D.   On: 06/20/2020 23:05    Procedures Procedures   Medications Ordered  in ED Medications  iohexol (OMNIPAQUE) 300 MG/ML solution 100 mL (100 mLs Intravenous Contrast Given 06/20/20 2246)    ED Course  I have reviewed the triage vital signs and the nursing notes.  Pertinent labs & imaging results that were available during my care of the patient were reviewed by me and considered in my medical decision making (see chart for details).    MDM Rules/Calculators/A&P                          Patient presents with upper abdominal pain.  Goes to the back.  Lab work reassuring.  Does have some tenderness.  Will get CT scan.  Doubt cardiac cause of the pain.  Did consider aorta pathology.    CT scan of been done and reassuring.  Did show gallstones without signs of cholecystitis.  Pain is improved.  Potentially could have been a biliary colic.  Discussed with patient and will follow with a surgeon as an outpatient.  Will discharge home. Final Clinical Impression(s) / ED Diagnoses Final diagnoses:  Epigastric pain  Calculus of gallbladder without cholecystitis without obstruction     Rx / DC Orders ED Discharge Orders     None        Davonna Belling, MD 06/20/20 2245    Davonna Belling, MD 06/20/20 2322

## 2020-07-01 ENCOUNTER — Other Ambulatory Visit: Payer: Self-pay | Admitting: Cardiology

## 2020-07-05 ENCOUNTER — Other Ambulatory Visit: Payer: Self-pay

## 2020-07-05 MED ORDER — ATORVASTATIN CALCIUM 80 MG PO TABS: ORAL_TABLET | ORAL | 0 refills | Status: DC

## 2020-07-28 DIAGNOSIS — K8020 Calculus of gallbladder without cholecystitis without obstruction: Secondary | ICD-10-CM | POA: Diagnosis not present

## 2020-11-02 DIAGNOSIS — R42 Dizziness and giddiness: Secondary | ICD-10-CM | POA: Diagnosis not present

## 2020-11-02 DIAGNOSIS — H903 Sensorineural hearing loss, bilateral: Secondary | ICD-10-CM | POA: Diagnosis not present

## 2020-11-02 DIAGNOSIS — H6123 Impacted cerumen, bilateral: Secondary | ICD-10-CM | POA: Diagnosis not present

## 2020-11-14 ENCOUNTER — Encounter: Payer: Self-pay | Admitting: Nurse Practitioner

## 2020-11-14 ENCOUNTER — Telehealth (INDEPENDENT_AMBULATORY_CARE_PROVIDER_SITE_OTHER): Payer: BC Managed Care – PPO | Admitting: Nurse Practitioner

## 2020-11-14 ENCOUNTER — Other Ambulatory Visit: Payer: Self-pay

## 2020-11-14 DIAGNOSIS — J101 Influenza due to other identified influenza virus with other respiratory manifestations: Secondary | ICD-10-CM | POA: Diagnosis not present

## 2020-11-14 DIAGNOSIS — J069 Acute upper respiratory infection, unspecified: Secondary | ICD-10-CM | POA: Diagnosis not present

## 2020-11-14 DIAGNOSIS — R509 Fever, unspecified: Secondary | ICD-10-CM | POA: Diagnosis not present

## 2020-11-14 NOTE — Progress Notes (Signed)
Subjective:    Patient ID: Benjamin [name], male    DOB: 20-Nov-1950, 70 y.o.   MRN: [id number]  HPI: Benjamin [name] is a 70 y.o. male presenting virtually for fever, cough, congestion.  Chief Complaint  Patient presents with   URI   UPPER RESPIRATORY TRACT INFECTION Patient reports he had walking pneumonia one time and wants to make sure that isn't what he has now. Onset: Friday  COVID-19 testing history: negative at home today COVID-19 vaccination status: has had 3 shots, no recent booster Fever: yes; 102 Cough: yes; dry Shortness of breath: no Wheezing: no Chest pain: yes, with cough Chest tightness: no Chest congestion: yes Nasal congestion: yes Runny nose: no Post nasal drip: yes Sneezing: yes Sore throat: no Swollen glands: yes Sinus pressure: yes Headache: yes Face pain: no Toothache: no Ear pain: no  Ear pressure: no  Eyes red/itching:no Eye drainage/crusting: no  Nausea: no  Vomiting: no Diarrhea: no  Change in appetite: no  Loss of taste/smell: no  Rash: no Fatigue: yes Sick contacts: yes; co worked is sick today Strep contacts: no  Context: up and down Recurrent sinusitis: no Treatments attempted: Nyquil, alka seltzer plus Relief with OTC medications: no   Allergies  Allergen Reactions   Penicillins Swelling    Outpatient Encounter Medications as of 11/14/2020  Medication Sig   aspirin 81 MG tablet Take 81 mg by mouth daily. Reported on 05/05/2015   atorvastatin (LIPITOR) 80 MG tablet TAKE 1 TABLET BY MOUTH DAILY AT 6PM IN THE EVENING   carvedilol (COREG) 3.125 MG tablet TAKE ONE TABLET BY MOUTH TWICE A DAY   Cholecalciferol (VITAMIN D) 2000 UNITS CAPS Take 1 capsule by mouth daily.    ENTRESTO 49-51 MG TAKE ONE TABLET BY MOUTH TWICE A DAY   gabapentin (NEURONTIN) 300 MG capsule Take 1 capsule (300 mg total) by mouth 3 (three) times daily as needed (NERVE PAIN).   No facility-administered encounter medications on file as of 11/14/2020.     Patient Active Problem List   Diagnosis Date Noted   OSA (obstructive sleep apnea) 11/02/2015   Benign essential HTN 11/02/2015   Obesity (BMI 30-39.9) 83/66/2947   Chronic systolic heart failure ([place]) 05/19/2015   CAD in native artery 05/19/2015   ASCVD (arteriosclerotic cardiovascular disease)    Allergy    Myocardial infarction ([place]) 04/20/1999    Past Medical History:  Diagnosis Date   Allergy    ASCVD (arteriosclerotic cardiovascular disease)    Diabetes mellitus without complication ([place])    prediabetes   Hyperlipidemia    Hypertension    Myocardial infarction ([place]) 04/20/1999   Obesity (BMI 30-39.9)    OSA (obstructive sleep apnea) 11/02/2015    Relevant past medical, surgical, family and social history reviewed and updated as indicated. Interim medical history since our last visit reviewed.  Review of Systems Per HPI unless specifically indicated above     Objective:    There were no vitals taken for this visit.  Wt Readings from Last 3 Encounters:  06/20/20 258 lb (117 kg)  05/13/20 258 lb (117 kg)  05/06/20 260 lb 6.4 oz (118.1 kg)    Physical Exam Vitals and nursing note reviewed.  Constitutional:      General: He is not in acute distress.    Appearance: Normal appearance. He is not toxic-appearing.  HENT:     Head: Normocephalic.     Right Ear: External ear normal.     Left Ear: External ear normal.  Nose: Congestion present.     Mouth/Throat:     Mouth: Mucous membranes are moist.     Pharynx: Oropharynx is clear.  Eyes:     General: No scleral icterus.    Extraocular Movements: Extraocular movements intact.  Cardiovascular:     Comments: Unable to assess heart sounds via virtual visit. Pulmonary:     Effort: Pulmonary effort is normal. No respiratory distress.     Comments: Unable to assess breath sounds via virtual visit.  Patient talking in complete sentences during telemedicine visit without accessory muscle use.   Skin:     Coloration: Skin is not jaundiced or pale.     Findings: No erythema.  Neurological:     Mental Status: He is alert and oriented to person, place, and time.  Psychiatric:        Mood and Affect: Mood normal.        Behavior: Behavior normal.        Thought Content: Thought content normal.        Judgment: Judgment normal.       Assessment & Plan:  1. Upper respiratory tract infection, unspecified type Acute.  Suspect acute viral illness.  Encouraged respiratory panel testing including influenza.  Continue supportive care with hydration, Tylenol as needed for fever, nasal sprays/rinses.  Note given for work.  Return to clinic if symptoms not improved by the end of the week or if they worsen.   - SARS-CoV-2 RNA (COVID-19) and Respiratory Viral Panel, Qualitative NAAT   Follow up plan: Return if symptoms worsen or fail to improve.   Due to the catastrophic nature of the COVID-19 pandemic, this video visit was completed soley via audio and visual contact via Caregility due to the restrictions of the COVID-19 pandemic.  All issues as above were discussed and addressed. Physical exam was done as above through visual confirmation on Caregility. If it was felt that the patient should be evaluated in the office, they were directed there. The patient verbally consented to this visit. Location of the patient: home Location of the provider: work Those involved with this call:  Provider: Noemi Chapel, DNP, FNP-C CMA: n/a Front Desk/Registration: Santina Evans  Time spent on call:  10 minutes with patient face to face via video conference. More than 50% of this time was spent in counseling and coordination of care. 15 minutes total spent in review of patient's record and preparation of their chart. I verified patient identity using two factors (patient name and date of birth). Patient consents verbally to being seen via telemedicine visit today.

## 2020-11-17 LAB — SARS-COV-2 RNA (COVID-19) RESP VIRAL PNL QL NAAT
Adenovirus B: NOT DETECTED
HUMAN PARAINFLU VIRUS 1: NOT DETECTED
HUMAN PARAINFLU VIRUS 2: NOT DETECTED
HUMAN PARAINFLU VIRUS 3: NOT DETECTED
INFLUENZA A SUBTYPE H1: NOT DETECTED
INFLUENZA A SUBTYPE H3: DETECTED — AB
Influenza A: DETECTED — AB
Influenza B: NOT DETECTED
Metapneumovirus: NOT DETECTED
Respiratory Syncytial Virus A: NOT DETECTED
Respiratory Syncytial Virus B: NOT DETECTED
Rhinovirus: NOT DETECTED
SARS CoV2 RNA: NOT DETECTED

## 2020-11-17 MED ORDER — OSELTAMIVIR PHOSPHATE 75 MG PO CAPS: 75.0000 mg | ORAL_CAPSULE | Freq: Two times a day (BID) | ORAL | 0 refills | Status: DC

## 2020-11-17 NOTE — Addendum Note (Signed)
Addended by: Noemi Chapel A on: 11/17/2020 07:38 AM   Modules accepted: Orders

## 2020-12-16 ENCOUNTER — Ambulatory Visit (INDEPENDENT_AMBULATORY_CARE_PROVIDER_SITE_OTHER): Payer: BC Managed Care – PPO | Admitting: Cardiology

## 2020-12-16 ENCOUNTER — Encounter: Payer: Self-pay | Admitting: Cardiology

## 2020-12-16 ENCOUNTER — Other Ambulatory Visit: Payer: Self-pay

## 2020-12-16 VITALS — BP 118/76 | HR 65 | Ht 70.0 in | Wt 268.4 lb

## 2020-12-16 DIAGNOSIS — I10 Essential (primary) hypertension: Secondary | ICD-10-CM | POA: Diagnosis not present

## 2020-12-16 DIAGNOSIS — I2510 Atherosclerotic heart disease of native coronary artery without angina pectoris: Secondary | ICD-10-CM | POA: Diagnosis not present

## 2020-12-16 DIAGNOSIS — E782 Mixed hyperlipidemia: Secondary | ICD-10-CM | POA: Diagnosis not present

## 2020-12-16 DIAGNOSIS — I5022 Chronic systolic (congestive) heart failure: Secondary | ICD-10-CM

## 2020-12-16 NOTE — Progress Notes (Signed)
Clinical Summary Benjamin [name] is a 70 y.o.male seen today for follow up of the following medical problems.        1. CAD - previous notes mention MI in 2001 at [facility], cannot find cath report. Patient has stent card that shows he received a stent to his LAD at that time.   - repeat cath 05/2015 that showed patent coronaries.  This was down after he was found to have a drop in LVEF     - no recent chest pain. No SOB/DOE - compliant with meds         2. Chronic systolic heart failure/NICM -  05/2015 echo showed an LVEF 25-30%, this is a new finding of systolic dysfunction. - 05/2015 cath showed patent coronaries. Normal filling pressures at that time.    - repeat echo 9/217, LVEF improved to 45-50%. Grade I diasotlic dysfunction.     - no recent edema.      3. OSA  - followed by Dr Radford Pax - compliant with cpap.    4. HL -04/2017 lipids at goal, compliant with statin.    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    Past Medical History:  Diagnosis Date   Allergy    ASCVD (arteriosclerotic cardiovascular disease)    Diabetes mellitus without complication ([place])    prediabetes   Hyperlipidemia    Hypertension    Myocardial infarction ([place]) 04/20/1999   Obesity (BMI 30-39.9)    OSA (obstructive sleep apnea) 11/02/2015     Allergies  Allergen Reactions   Penicillins Swelling     Current Outpatient Medications  Medication Sig Dispense Refill   aspirin 81 MG tablet Take 81 mg by mouth daily. Reported on 05/05/2015     atorvastatin (LIPITOR) 80 MG tablet TAKE 1 TABLET BY MOUTH DAILY AT 6PM IN THE EVENING 90 tablet 0   carvedilol (COREG) 3.125 MG tablet TAKE ONE TABLET BY MOUTH TWICE A DAY 180 tablet 3   Cholecalciferol (VITAMIN D) 2000 UNITS CAPS Take 1 capsule by mouth daily.      ENTRESTO 49-51 MG TAKE ONE TABLET BY MOUTH TWICE A DAY 180 tablet 3   gabapentin (NEURONTIN)  300 MG capsule Take 1 capsule (300 mg total) by mouth 3 (three) times daily as needed (NERVE PAIN). 90 capsule 3   oseltamivir (TAMIFLU) 75 MG capsule Take 1 capsule (75 mg total) by mouth 2 (two) times daily. 10 capsule 0   No current facility-administered medications for this visit.     Past Surgical History:  Procedure Laterality Date   CARDIAC CATHETERIZATION N/A 05/19/2015   Procedure: Right/Left Heart Cath and Coronary Angiography;  Surgeon: Belva Crome, MD;  Location: [facility];  Service: Cardiovascular;  Laterality: N/A;   CORONARY STENT PLACEMENT  04/18/1999     Allergies  Allergen Reactions   Penicillins Swelling      Family History  Problem Relation Age of Onset   AAA (abdominal aortic aneurysm) Father      Social History Benjamin [name] reports that he quit smoking about 46 years ago. His smoking use included cigarettes. He started smoking about 54 years ago. He has a 4.00 pack-year smoking history. He has been exposed to tobacco smoke. He has never used smokeless tobacco. Benjamin [name] reports no history of alcohol use.   Review  of Systems CONSTITUTIONAL: No weight loss, fever, chills, weakness or fatigue.  HEENT: Eyes: No visual loss, blurred vision, double vision or yellow sclerae.No hearing loss, sneezing, congestion, runny nose or sore throat.  SKIN: No rash or itching.  CARDIOVASCULAR: per hpi RESPIRATORY: No shortness of breath, cough or sputum.  GASTROINTESTINAL: No anorexia, nausea, vomiting or diarrhea. No abdominal pain or blood.  GENITOURINARY: No burning on urination, no polyuria NEUROLOGICAL: No headache, dizziness, syncope, paralysis, ataxia, numbness or tingling in the extremities. No change in bowel or bladder control.  MUSCULOSKELETAL: No muscle, back pain, joint pain or stiffness.  LYMPHATICS: No enlarged nodes. No history of splenectomy.  PSYCHIATRIC: No history of depression or anxiety.  ENDOCRINOLOGIC: No reports of sweating, cold or  heat intolerance. No polyuria or polydipsia.  Marland Kitchen   Physical Examination Today's Vitals   12/16/20 0901  BP: 118/76  Pulse: 65  SpO2: 95%  Weight: 268 lb 6.4 oz (121.7 kg)  Height: 5\' 10"  (1.778 m)   Body mass index is 38.51 kg/m.  Gen: resting comfortably, no acute distress HEENT: no scleral icterus, pupils equal round and reactive, no palptable cervical adenopathy,  CV: RRR, no m/r/g no jvd Resp: Clear to auscultation bilaterally GI: abdomen is soft, non-tender, non-distended, normal bowel sounds, no hepatosplenomegaly MSK: extremities are warm, no edema.  Skin: warm, no rash Neuro:  no focal deficits Psych: appropriate affect   Diagnostic Studies 05/2015 echo Study Conclusions   - Left ventricle: The cavity size was normal. Wall thickness was   normal. Systolic function was severely reduced. The estimated   ejection fraction was in the range of 25% to 30%. Diffuse   hypokinesis. Doppler parameters are consistent with abnormal left   ventricular relaxation (grade 1 diastolic dysfunction). - Regional wall motion abnormality: Akinesis of the mid anterior,   basal-mid anteroseptal, and apical myocardium. - Aortic valve: Mildly calcified annulus. Trileaflet; mildly   thickened leaflets. Valve area (VTI): 2.42 cm^2. Valve area   (Vmax): 2.32 cm^2. - Atrial septum: No defect or patent foramen ovale was identified. - Technically difficult study. Echocontrast was used to enhance   visualization.         05/2015 cath Prox RCA to Mid RCA lesion, 20% stenosed. Ost LAD to Prox LAD lesion, 30% stenosed. The lesion was previously treated with a stent (unknown type). Dist Cx lesion, 50% stenosed.   Severe anterior apical hypokinesis. Estimated ejection fraction 35%. Normal left ventricular hemodynamics with EDP of 14 mmHg and mean capillary wedge 17 mmHg. Widely patent coronary arteries with 30% diffuse ISR in the proximal LAD stent. There is 50% eccentric stenosis in the distal  circumflex. Luminal irregularities are noted in the right coronary.   Post cath RECOMMENDATIONS:   Aggressive risk factor modification to prevent progression of CAD Aggressive heart failure therapy to minimize progression of LV dysfunction.     09/2015 echo Study Conclusions   - Left ventricle: The cavity size was normal. Wall thickness was   normal. Systolic function was mildly reduced. The estimated   ejection fraction was in the range of 45% to 50%. Doppler   parameters are consistent with abnormal left ventricular   relaxation (grade 1 diastolic dysfunction). - Regional wall motion abnormality: Hypokinesis of the mid   anterior, mid anteroseptal, and apical myocardium. - Aortic valve: Mildly calcified annulus. Trileaflet; mildly   thickened leaflets. Valve area (VTI): 3.07 cm^2. Valve area   (Vmax): 2.7 cm^2. Valve area (Vmean): 2.7 cm^2. - Technically difficult study.  Assessment and Plan  1. CAD -no recent symptoms, continue current meds EKG SR, no acute ischemic changes     2. Chronic systolic heart failure/NICM - LVEF improved with medical therapy. -no symptoms, continue current meds     3. Hyperlipidemia - at goal, continue current meds   4. HTN - at goal, continue current meds  F/u 1 year     Arnoldo Lenis, M.D.

## 2020-12-16 NOTE — Patient Instructions (Addendum)

## 2020-12-17 ENCOUNTER — Other Ambulatory Visit: Payer: Self-pay | Admitting: Family Medicine

## 2021-03-01 ENCOUNTER — Telehealth: Payer: Self-pay | Admitting: Cardiology

## 2021-03-01 NOTE — Telephone Encounter (Signed)
Pt c/o medication issue: ? ?1. Name of Medication: ENTRESTO 49-51 MG ? ?2. How are you currently taking this medication (dosage and times per day)? 1 tablet twice  ? ?3. Are you having a reaction (difficulty breathing--STAT)? no ? ?4. What is your medication issue? Patient states he only has enough medication for tomorrow, but his insurance denied it. ?

## 2021-03-01 NOTE — Telephone Encounter (Signed)
Error

## 2021-03-01 NOTE — Telephone Encounter (Signed)
error 

## 2021-03-02 ENCOUNTER — Encounter: Payer: Self-pay | Admitting: Cardiology

## 2021-03-02 ENCOUNTER — Telehealth: Payer: Self-pay | Admitting: Cardiology

## 2021-03-02 MED ORDER — ENTRESTO 49-51 MG PO TABS: 1.0000 | ORAL_TABLET | Freq: Two times a day (BID) | ORAL | 0 refills | Status: DC

## 2021-03-02 NOTE — Telephone Encounter (Signed)
Patient calling the office for samples of medication: ? ? ?1.  What medication and dosage are you requesting samples for? ENTRESTO 49-51 MG ? ?2.  Are you currently out of this medication? Only has enough for tonight ? ?Patient is waiting on prior-auth Entresto.  He would like a call back from the nurse.   ?  ?

## 2021-03-02 NOTE — Telephone Encounter (Signed)
Prior Authorization for Delene Loll denied x2 by insurance with appeal denied as well.  ?

## 2021-03-02 NOTE — Telephone Encounter (Signed)
Pt picked up Entresto samples from office in [place]. Will RTS samples in [place].  ?

## 2021-03-02 NOTE — Telephone Encounter (Signed)
Lmtcb ? ?Entresto Sample ?Entresto 49-51 mg tablets ?Lot #:[id number] ?Exp:01/2023. ? ?At fron desk in [place]. Waiting for PA from insurance  ?

## 2021-03-02 NOTE — Telephone Encounter (Signed)
error 

## 2021-03-02 NOTE — Telephone Encounter (Signed)
Will send clinical documentation with renewed prior authorization  ?

## 2021-03-09 ENCOUNTER — Telehealth: Payer: Self-pay

## 2021-03-09 ENCOUNTER — Other Ambulatory Visit: Payer: Self-pay

## 2021-03-09 ENCOUNTER — Ambulatory Visit (INDEPENDENT_AMBULATORY_CARE_PROVIDER_SITE_OTHER): Payer: BC Managed Care – PPO

## 2021-03-09 VITALS — BP 114/68 | HR 64 | Ht 70.0 in | Wt 268.0 lb

## 2021-03-09 DIAGNOSIS — Z0000 Encounter for general adult medical examination without abnormal findings: Secondary | ICD-10-CM

## 2021-03-09 NOTE — Patient Instructions (Signed)
Benjamin [name] , Thank you for taking time to come for your Medicare Wellness Visit. I appreciate your ongoing commitment to your health goals. Please review the following plan we discussed and let me know if I can assist you in the future.   Screening recommendations/referrals: Colonoscopy: Done 05/13/2018 Repeat in 10 years  Recommended yearly ophthalmology/optometry visit for glaucoma screening and checkup Recommended yearly dental visit for hygiene and checkup  Vaccinations: Influenza vaccine: Done 11/09/2020 Repeat annually  Pneumococcal vaccine: Done 04/18/2017 and 11/24/2014 Tdap vaccine: Due Repeat in 10 years  Shingles vaccine: Done 01/09/2020 and 04/01/2020.   Covid-19: Done 02/06/2019, 04/07/2019 and 11/02/2019  Advanced directives: Advance directive discussed with you today. Even though you declined this today, please call our office should you change your mind, and we can give you the proper paperwork for you to fill out.   Conditions/risks identified: Aim for 30 minutes of exercise or brisk walking, 6-8 glasses of water, and 5 servings of fruits and vegetables each day.   Next appointment: Follow up in one year for your annual wellness visit. 2024.  Preventive Care 50 Years and Older, Male  Preventive care refers to lifestyle choices and visits with your health care provider that can promote health and wellness. What does preventive care include? A yearly physical exam. This is also called an annual well check. Dental exams once or twice a year. Routine eye exams. Ask your health care provider how often you should have your eyes checked. Personal lifestyle choices, including: Daily care of your teeth and gums. Regular physical activity. Eating a healthy diet. Avoiding tobacco and drug use. Limiting alcohol use. Practicing safe sex. Taking low doses of aspirin every day. Taking vitamin and mineral supplements as recommended by your health care provider. What happens during an  annual well check? The services and screenings done by your health care provider during your annual well check will depend on your age, overall health, lifestyle risk factors, and family history of disease. Counseling  Your health care provider may ask you questions about your: Alcohol use. Tobacco use. Drug use. Emotional well-being. Home and relationship well-being. Sexual activity. Eating habits. History of falls. Memory and ability to understand (cognition). Work and work Statistician. Screening  You may have the following tests or measurements: Height, weight, and BMI. Blood pressure. Lipid and cholesterol levels. These may be checked every 5 years, or more frequently if you are over 4 years old. Skin check. Lung cancer screening. You may have this screening every year starting at age 57 if you have a 30-pack-year history of smoking and currently smoke or have quit within the past 15 years. Fecal occult blood test (FOBT) of the stool. You may have this test every year starting at age 66. Flexible sigmoidoscopy or colonoscopy. You may have a sigmoidoscopy every 5 years or a colonoscopy every 10 years starting at age 16. Prostate cancer screening. Recommendations will vary depending on your family history and other risks. Hepatitis C blood test. Hepatitis B blood test. Sexually transmitted disease (STD) testing. Diabetes screening. This is done by checking your blood sugar (glucose) after you have not eaten for a while (fasting). You may have this done every 1-3 years. Abdominal aortic aneurysm (AAA) screening. You may need this if you are a current or former smoker. Osteoporosis. You may be screened starting at age 62 if you are at high risk. Talk with your health care provider about your test results, treatment options, and if necessary, the need for  more tests. Vaccines  Your health care provider may recommend certain vaccines, such as: Influenza vaccine. This is recommended  every year. Tetanus, diphtheria, and acellular pertussis (Tdap, Td) vaccine. You may need a Td booster every 10 years. Zoster vaccine. You may need this after age 88. Pneumococcal 13-valent conjugate (PCV13) vaccine. One dose is recommended after age 11. Pneumococcal polysaccharide (PPSV23) vaccine. One dose is recommended after age 47. Talk to your health care provider about which screenings and vaccines you need and how often you need them. This information is not intended to replace advice given to you by your health care provider. Make sure you discuss any questions you have with your health care provider. Document Released: 01/14/2015 Document Revised: 09/07/2015 Document Reviewed: 10/19/2014 Elsevier Interactive Patient Education  2017 [place] Prevention in the Home Falls can cause injuries. They can happen to people of all ages. There are many things you can do to make your home safe and to help prevent falls. What can I do on the outside of my home? Regularly fix the edges of walkways and driveways and fix any cracks. Remove anything that might make you trip as you walk through a door, such as a raised step or threshold. Trim any bushes or trees on the path to your home. Use bright outdoor lighting. Clear any walking paths of anything that might make someone trip, such as rocks or tools. Regularly check to see if handrails are loose or broken. Make sure that both sides of any steps have handrails. Any raised decks and porches should have guardrails on the edges. Have any leaves, snow, or ice cleared regularly. Use sand or salt on walking paths during winter. Clean up any spills in your garage right away. This includes oil or grease spills. What can I do in the bathroom? Use night lights. Install grab bars by the toilet and in the tub and shower. Do not use towel bars as grab bars. Use non-skid mats or decals in the tub or shower. If you need to sit down in the shower,  use a plastic, non-slip stool. Keep the floor dry. Clean up any water that spills on the floor as soon as it happens. Remove soap buildup in the tub or shower regularly. Attach bath mats securely with double-sided non-slip rug tape. Do not have throw rugs and other things on the floor that can make you trip. What can I do in the bedroom? Use night lights. Make sure that you have a light by your bed that is easy to reach. Do not use any sheets or blankets that are too big for your bed. They should not hang down onto the floor. Have a firm chair that has side arms. You can use this for support while you get dressed. Do not have throw rugs and other things on the floor that can make you trip. What can I do in the kitchen? Clean up any spills right away. Avoid walking on wet floors. Keep items that you use a lot in easy-to-reach places. If you need to reach something above you, use a strong step stool that has a grab bar. Keep electrical cords out of the way. Do not use floor polish or wax that makes floors slippery. If you must use wax, use non-skid floor wax. Do not have throw rugs and other things on the floor that can make you trip. What can I do with my stairs? Do not leave any items on the stairs. Make  sure that there are handrails on both sides of the stairs and use them. Fix handrails that are broken or loose. Make sure that handrails are as long as the stairways. Check any carpeting to make sure that it is firmly attached to the stairs. Fix any carpet that is loose or worn. Avoid having throw rugs at the top or bottom of the stairs. If you do have throw rugs, attach them to the floor with carpet tape. Make sure that you have a light switch at the top of the stairs and the bottom of the stairs. If you do not have them, ask someone to add them for you. What else can I do to help prevent falls? Wear shoes that: Do not have high heels. Have rubber bottoms. Are comfortable and fit you  well. Are closed at the toe. Do not wear sandals. If you use a stepladder: Make sure that it is fully opened. Do not climb a closed stepladder. Make sure that both sides of the stepladder are locked into place. Ask someone to hold it for you, if possible. Clearly mark and make sure that you can see: Any grab bars or handrails. First and last steps. Where the edge of each step is. Use tools that help you move around (mobility aids) if they are needed. These include: Canes. Walkers. Scooters. Crutches. Turn on the lights when you go into a dark area. Replace any light bulbs as soon as they burn out. Set up your furniture so you have a clear path. Avoid moving your furniture around. If any of your floors are uneven, fix them. If there are any pets around you, be aware of where they are. Review your medicines with your doctor. Some medicines can make you feel dizzy. This can increase your chance of falling. Ask your doctor what other things that you can do to help prevent falls. This information is not intended to replace advice given to you by your health care provider. Make sure you discuss any questions you have with your health care provider. Document Released: 10/14/2008 Document Revised: 05/26/2015 Document Reviewed: 01/22/2014 Elsevier Interactive Patient Education  2017 [employer].

## 2021-03-09 NOTE — Telephone Encounter (Signed)
Pt c/o having increasing pain in his feet and legs in the last couple of months due to his neuropathy. Pt asks if he can be referred to a specialist who may can help his with this issue? Thank you.  ?

## 2021-03-09 NOTE — Progress Notes (Signed)
Subjective:   Benjamin [name] is a 71 y.o. male who presents for an Initial Medicare Annual Wellness Visit. Virtual Visit via Telephone Note  I connected with  [name] on 03/09/21 at  2:00 PM EST by telephone and verified that I am speaking with the correct person using two identifiers.  Location: Patient: HOME Provider: [facility] Persons participating in the virtual visit: patient/Nurse Health Advisor   I discussed the limitations, risks, security and privacy concerns of performing an evaluation and management service by telephone and the availability of in person appointments. The patient expressed understanding and agreed to proceed.  Interactive audio and video telecommunications were attempted between this nurse and patient, however failed, due to patient having technical difficulties OR patient did not have access to video capability.  We continued and completed visit with audio only.  Some vital signs may be absent or patient reported.   Chriss Driver, LPN  Review of Systems     Cardiac Risk Factors include: advanced age (>53mn, >>86women);hypertension;male gender;sedentary lifestyle;obesity (BMI >30kg/m2)     Objective:    Today's Vitals   03/09/21 1404  BP: 114/68  Pulse: 64  SpO2: 99%  Weight: 268 lb (121.6 kg)  Height: '5\' 10"'$  (1.778 m)   Body mass index is 38.45 kg/m.  Advanced Directives 03/09/2021 06/20/2020 11/23/2015 05/19/2015  Does Patient Have a Medical Advance Directive? No No No No  Would patient like information on creating a medical advance directive? No - Patient declined No - Patient declined - No - patient declined information    Current Medications (verified) Outpatient Encounter Medications as of 03/09/2021  Medication Sig   aspirin 81 MG tablet Take 81 mg by mouth daily. Reported on 05/05/2015   atorvastatin (LIPITOR) 80 MG tablet TAKE ONE TABLET BY MOUTH EVERY EVENING AT 6PM   carvedilol (COREG) 3.125 MG tablet TAKE ONE TABLET BY MOUTH  TWICE A DAY   Cholecalciferol (VITAMIN D) 2000 UNITS CAPS Take 1 capsule by mouth daily.    oseltamivir (TAMIFLU) 75 MG capsule Take 1 capsule (75 mg total) by mouth 2 (two) times daily.   sacubitril-valsartan (ENTRESTO) 49-51 MG Take 1 tablet by mouth 2 (two) times daily.   gabapentin (NEURONTIN) 300 MG capsule Take 1 capsule (300 mg total) by mouth 3 (three) times daily as needed (NERVE PAIN). (Patient not taking: Reported on 03/09/2021)   No facility-administered encounter medications on file as of 03/09/2021.    Allergies (verified) Penicillins   History: Past Medical History:  Diagnosis Date   Allergy    ASCVD (arteriosclerotic cardiovascular disease)    Diabetes mellitus without complication ([facility]    prediabetes   Hyperlipidemia    Hypertension    Myocardial infarction ([facility] 04/20/1999   Obesity (BMI 30-39.9)    OSA (obstructive sleep apnea) 11/02/2015   Past Surgical History:  Procedure Laterality Date   CARDIAC CATHETERIZATION N/A 05/19/2015   Procedure: Right/Left Heart Cath and Coronary Angiography;  Surgeon: HBelva Crome MD;  [facility];  Service: Cardiovascular;  Laterality: N/A;   CORONARY STENT PLACEMENT  04/18/1999   Family History  Problem Relation Age of Onset   AAA (abdominal aortic aneurysm) Father    Social History   Socioeconomic History   Marital status: Married    Spouse name: JArville Go  Number of children: 2   Years of education: Not on file   Highest education level: Not on file  Occupational History   Not on file  Tobacco Use   Smoking status: Former    Packs/day: 0.50    Years: 8.00    Pack years: 4.00    Types: Cigarettes    Start date: 07/24/1966    Quit date: 07/24/1974    Years since quitting: 46.6    Passive exposure: Past   Smokeless tobacco: Never  Vaping Use   Vaping Use: Never used  Substance and Sexual Activity   Alcohol use: No    Alcohol/week: 0.0 standard drinks   Drug use: No   Sexual activity: Yes    Birth  control/protection: None  Other Topics Concern   Not on file  Social History Narrative   Pt currently works at [employer].    Social Determinants of Health   Financial Resource Strain: Low Risk    Difficulty of Paying Living Expenses: Not hard at all  Food Insecurity: No Food Insecurity   Worried About Charity fundraiser in the Last Year: Never true   [place] in the Last Year: Never true  Transportation Needs: No Transportation Needs   Lack of Transportation (Medical): No   Lack of Transportation (Non-Medical): No  Physical Activity: Inactive   Days of Exercise per Week: 0 days   Minutes of Exercise per Session: 0 min  Stress: No Stress Concern Present   Feeling of Stress : Not at all  Social Connections: Moderately Isolated   Frequency of Communication with Friends and Family: More than three times a week   Frequency of Social Gatherings with Friends and Family: More than three times a week   Attends Religious Services: Never   Marine scientist or Organizations: No   Attends Music therapist: Never   Marital Status: Married    Tobacco Counseling Counseling given: Not Answered   Clinical Intake:  Pre-visit preparation completed: Yes  Pain : No/denies pain     BMI - recorded: 38.45 Nutritional Status: BMI > 30  Obese Nutritional Risks: None Diabetes: No  How often do you need to have someone help you when you read instructions, pamphlets, or other written materials from your doctor or pharmacy?: 1 - Never  Diabetic?No  Interpreter Needed?: No  Information entered by :: mj Aarica Wax, lpn   Activities of Daily Living In your present state of health, do you have any difficulty performing the following activities: 03/09/2021  Hearing? N  Vision? N  Difficulty concentrating or making decisions? Y  Comment remebering names  Walking or climbing stairs? N  Dressing or bathing? N  Doing errands, shopping? N  Preparing Food and  eating ? N  Using the Toilet? N  In the past six months, have you accidently leaked urine? N  Do you have problems with loss of bowel control? N  Managing your Medications? N  Managing your Finances? N  Housekeeping or managing your Housekeeping? N  Some recent data might be hidden    Patient Care Team: Susy Frizzle, MD as PCP - General (Family Medicine) Harl Bowie Alphonse Guild, MD as PCP - Cardiology (Cardiology)  Indicate any recent Medical Services you may have received from other than [facility] providers in the past year (date may be approximate).     Assessment:   This is a routine wellness examination for Shea.  Hearing/Vision screen Hearing Screening - Comments:: Some hearing issues.  Vision Screening - Comments:: No glasses.   Dietary issues and exercise activities discussed: Current Exercise Habits: The patient has a physically strenuous job, but  has no regular exercise apart from work., Exercise limited by: cardiac condition(s)   Goals Addressed             This Visit's Progress    Weight (lb) < 200 lb (90.7 kg)   268 lb (121.6 kg)      Depression Screen PHQ 2/9 Scores 03/09/2021 05/13/2020 03/19/2017 03/11/2017 10/17/2015  PHQ - 2 Score 0 0 0 0 0    Fall Risk Fall Risk  03/09/2021 05/13/2020 03/19/2017 03/11/2017 10/17/2015  Falls in the past year? 0 0 No No No  Number falls in past yr: 0 - - - -  Injury with Fall? 0 - - - -  Risk for fall due to : No Fall Risks No Fall Risks - - -  Follow up Falls prevention discussed Falls evaluation completed - - -    FALL RISK PREVENTION PERTAINING TO THE HOME:  Any stairs in or around the home? Yes  If so, are there any without handrails? No  Home free of loose throw rugs in walkways, pet beds, electrical cords, etc? Yes  Adequate lighting in your home to reduce risk of falls? Yes   ASSISTIVE DEVICES UTILIZED TO PREVENT FALLS:  Life alert? No  Use of a cane, walker or w/c? No  Grab bars in the bathroom? No  Shower  chair or bench in shower? Yes  Elevated toilet seat or a handicapped toilet? No   TIMED UP AND GO:  Was the test performed? Yes .  Length of time to ambulate 10 feet: 10 sec.   Gait steady and fast without use of assistive device  Cognitive Function:     6CIT Screen 03/09/2021  What Year? 0 points  What month? 0 points  What time? 0 points  Count back from 20 0 points  Months in reverse 0 points  Repeat phrase 0 points  Total Score 0    Immunizations Immunization History  Administered Date(s) Administered   Hepatitis B 05/18/2011, 01/26/2014, 02/23/2014   Influenza, High Dose Seasonal PF 08/30/2018   Influenza-Unspecified 10/12/2012, 10/01/2017, 11/09/2020   Moderna Sars-Covid-2 Vaccination 02/26/2019, 04/07/2019, 11/02/2019   Pneumococcal Conjugate-13 04/18/2017   Pneumococcal Polysaccharide-23 11/24/2014   Zoster Recombinat (Shingrix) 01/09/2020, 04/01/2020    TDAP status: Due, Education has been provided regarding the importance of this vaccine. Advised may receive this vaccine at local pharmacy or Health Dept. Aware to provide a copy of the vaccination record if obtained from local pharmacy or Health Dept. Verbalized acceptance and understanding.  Flu Vaccine status: Up to date  Pneumococcal vaccine status: Up to date  Covid-19 vaccine status: Completed vaccines  Qualifies for Shingles Vaccine? Yes   Zostavax completed Yes   Shingrix Completed?: Yes  Screening Tests Health Maintenance  Topic Date Due   Hepatitis C Screening  Never done   TETANUS/TDAP  Never done   COVID-19 Vaccine (4 - Booster for Moderna series) 12/28/2019   COLONOSCOPY (Pts 45-50yr Insurance coverage will need to be confirmed)  05/12/2028   Pneumonia Vaccine 71 Years old  Completed   INFLUENZA VACCINE  Completed   Zoster Vaccines- Shingrix  Completed   HPV VACCINES  Aged Out    Health Maintenance  Health Maintenance Due  Topic Date Due   Hepatitis C Screening  Never done    TETANUS/TDAP  Never done   COVID-19 Vaccine (4 - Booster for Moderna series) 12/28/2019    Colorectal cancer screening: Type of screening: Colonoscopy. Completed 05/12/2028. Repeat every 10 years  Lung  Cancer Screening: (Low Dose CT Chest recommended if Age 38-80 years, 30 pack-year currently smoking OR have quit w/in 15years.) does not qualify.   Additional Screening:  Hepatitis C Screening: does qualify; Completed DUE  Vision Screening: Recommended annual ophthalmology exams for early detection of glaucoma and other disorders of the eye. Is the patient up to date with their annual eye exam?  No  Who is the provider or what is the name of the office in which the patient attends annual eye exams? N/A If pt is not established with a provider, would they like to be referred to a provider to establish care? No .   Dental Screening: Recommended annual dental exams for proper oral hygiene  Community Resource Referral / Chronic Care Management: CRR required this visit?  No   CCM required this visit?  No      Plan:     I have personally reviewed and noted the following in the patients chart:   Medical and social history Use of alcohol, tobacco or illicit drugs  Current medications and supplements including opioid prescriptions. Patient is not currently taking opioid prescriptions. Functional ability and status Nutritional status Physical activity Advanced directives List of other physicians Hospitalizations, surgeries, and ER visits in previous 12 months Vitals Screenings to include cognitive, depression, and falls Referrals and appointments  In addition, I have reviewed and discussed with patient certain preventive protocols, quality metrics, and best practice recommendations. A written personalized care plan for preventive services as well as general preventive health recommendations were provided to patient.     Chriss Driver, LPN   05/06/2561   Nurse Notes: Pt is up  to date on health maintenance. Pt declined optometry referral. Up to date on vaccines.

## 2021-03-17 ENCOUNTER — Ambulatory Visit (INDEPENDENT_AMBULATORY_CARE_PROVIDER_SITE_OTHER): Payer: BC Managed Care – PPO | Admitting: Family Medicine

## 2021-03-17 ENCOUNTER — Other Ambulatory Visit: Payer: Self-pay

## 2021-03-17 ENCOUNTER — Encounter: Payer: Self-pay | Admitting: Family Medicine

## 2021-03-17 VITALS — BP 108/72 | HR 66 | Temp 97.3°F | Resp 18 | Ht 70.0 in | Wt 268.0 lb

## 2021-03-17 DIAGNOSIS — G629 Polyneuropathy, unspecified: Secondary | ICD-10-CM | POA: Diagnosis not present

## 2021-03-17 DIAGNOSIS — R7303 Prediabetes: Secondary | ICD-10-CM | POA: Diagnosis not present

## 2021-03-17 DIAGNOSIS — Z125 Encounter for screening for malignant neoplasm of prostate: Secondary | ICD-10-CM

## 2021-03-17 DIAGNOSIS — G609 Hereditary and idiopathic neuropathy, unspecified: Secondary | ICD-10-CM | POA: Diagnosis not present

## 2021-03-17 DIAGNOSIS — I2510 Atherosclerotic heart disease of native coronary artery without angina pectoris: Secondary | ICD-10-CM

## 2021-03-17 DIAGNOSIS — I10 Essential (primary) hypertension: Secondary | ICD-10-CM | POA: Diagnosis not present

## 2021-03-17 MED ORDER — PREGABALIN 75 MG PO CAPS: 75.0000 mg | ORAL_CAPSULE | Freq: Two times a day (BID) | ORAL | 3 refills | Status: DC

## 2021-03-17 MED ORDER — ENTRESTO 49-51 MG PO TABS: 1.0000 | ORAL_TABLET | Freq: Two times a day (BID) | ORAL | 3 refills | Status: DC

## 2021-03-17 NOTE — Progress Notes (Signed)
? ?Subjective:  ? ? Patient ID: Benjamin [name], male    DOB: [birth year], 71 y.o.   MRN: [id number] ? ?HPI ?First, the patient's insurance has denied his Entresto.  However his echocardiogram in 2017 showed an ejection fraction of 25 to 30%.  Therefore he is clinically indicated to be on Entresto.  I am not sure why his insurance has denied it.  However this is medically necessary and in keeping with standards of care.  Therefore I will send in a new prescription for the Entresto so that if his insurance will send me the prior authorization I can complete this for him but he certainly needs to stay on the medication.  I see no medical basis for them to deny the [facility] based on his history.  Apparently another physician was prescribing this and that is why we have not received the prior authorization.  However we can certainly work on this for him. ? ?Second, the patient passed what I have diagnosed him as peripheral neuropathy.  He reports tingling and pain in his feet at night.  He has severe varicose veins in both legs so I feel that some of his pain could be due to chronic venous insufficiency.  However he also has diminished sensation to vibration in both feet as well as to a 10 g monofilament although he can still feel it in all 5 toes.  He has excellent pulses in both feet.  There are no wounds.  He does report some numbness.  In the past we tried gabapentin but he saw no benefit from this. ? ? ? ?Past Medical History:  ?Diagnosis Date  ? Allergy   ? ASCVD (arteriosclerotic cardiovascular disease)   ? Diabetes mellitus without complication ([place])   ? prediabetes  ? Hyperlipidemia   ? Hypertension   ? Myocardial infarction ([place]) 04/20/1999  ? Obesity (BMI 30-39.9)   ? OSA (obstructive sleep apnea) 11/02/2015  ? ?Past Surgical History:  ?Procedure Laterality Date  ? CARDIAC CATHETERIZATION N/A 05/19/2015  ? Procedure: Right/Left Heart Cath and Coronary Angiography;  Surgeon: Belva Crome, MD;  Location: [facility];  Service: Cardiovascular;  Laterality: N/A;  ? CORONARY STENT PLACEMENT  04/18/1999  ? ?Current Outpatient Medications on File Prior to Visit  ?Medication Sig Dispense Refill  ? aspirin 81 MG tablet Take 81 mg by mouth daily. Reported on 05/05/2015    ? atorvastatin (LIPITOR) 80 MG tablet TAKE ONE TABLET BY MOUTH EVERY EVENING AT 6PM 90 tablet 3  ? carvedilol (COREG) 3.125 MG tablet TAKE ONE TABLET BY MOUTH TWICE A DAY 180 tablet 3  ? Cholecalciferol (VITAMIN D) 2000 UNITS CAPS Take 1 capsule by mouth daily.     ? gabapentin (NEURONTIN) 300 MG capsule Take 1 capsule (300 mg total) by mouth 3 (three) times daily as needed (NERVE PAIN). (Patient not taking: Reported on 03/09/2021) 90 capsule 3  ? oseltamivir (TAMIFLU) 75 MG capsule Take 1 capsule (75 mg total) by mouth 2 (two) times daily. 10 capsule 0  ? sacubitril-valsartan (ENTRESTO) 49-51 MG Take 1 tablet by mouth 2 (two) times daily. 28 tablet 0  ? ?No current facility-administered medications on file prior to visit.  ? ?Allergies  ?Allergen Reactions  ? Penicillins Swelling  ? ?Social History  ? ?Socioeconomic History  ? Marital status: Married  ?  Spouse name: Arville Go  ? Number of children: 2  ? Years of education: Not on file  ? Highest education level: Not on file  ?  Occupational History  ? Not on file  ?Tobacco Use  ? Smoking status: Former  ?  Packs/day: 0.50  ?  Years: 8.00  ?  Pack years: 4.00  ?  Types: Cigarettes  ?  Start date: 07/24/1966  ?  Quit date: 07/24/1974  ?  Years since quitting: 46.6  ?  Passive exposure: Past  ? Smokeless tobacco: Never  ?Vaping Use  ? Vaping Use: Never used  ?Substance and Sexual Activity  ? Alcohol use: No  ?  Alcohol/week: 0.0 standard drinks  ? Drug use: No  ? Sexual activity: Yes  ?  Birth control/protection: None  ?Other Topics Concern  ? Not on file  ?Social History Narrative  ? Pt currently works at [employer].   ? ?Social Determinants of Health  ? ?Financial Resource Strain: Low Risk   ? Difficulty of Paying  Living Expenses: Not hard at all  ?Food Insecurity: No Food Insecurity  ? Worried About Charity fundraiser in the Last Year: Never true  ? Ran Out of Food in the Last Year: Never true  ?Transportation Needs: No Transportation Needs  ? Lack of Transportation (Medical): No  ? Lack of Transportation (Non-Medical): No  ?Physical Activity: Inactive  ? Days of Exercise per Week: 0 days  ? Minutes of Exercise per Session: 0 min  ?Stress: No Stress Concern Present  ? Feeling of Stress : Not at all  ?Social Connections: Moderately Isolated  ? Frequency of Communication with Friends and Family: More than three times a week  ? Frequency of Social Gatherings with Friends and Family: More than three times a week  ? Attends Religious Services: Never  ? Active Member of Clubs or Organizations: No  ? Attends Archivist Meetings: Never  ? Marital Status: Married  ?Intimate Partner Violence: Unknown  ? Fear of Current or Ex-Partner: Not on file  ? Emotionally Abused: No  ? Physically Abused: No  ? Sexually Abused: No  ? ? ? ?Review of Systems  ?All other systems reviewed and are negative. ? ?   ?Objective:  ? Physical Exam ?Vitals reviewed.  ?Constitutional:   ?   Appearance: He is well-developed.  ?HENT:  ?   Right Ear: Tympanic membrane and ear canal normal.  ?   Left Ear: Tympanic membrane and ear canal normal.  ?   Nose: No mucosal edema or rhinorrhea.  ?   Right Sinus: No maxillary sinus tenderness or frontal sinus tenderness.  ?   Left Sinus: No maxillary sinus tenderness or frontal sinus tenderness.  ?   Mouth/Throat:  ?   Pharynx: No oropharyngeal exudate.  ?Eyes:  ?   Conjunctiva/sclera: Conjunctivae normal.  ?   Pupils: Pupils are equal, round, and reactive to light.  ?Cardiovascular:  ?   Rate and Rhythm: Normal rate and regular rhythm.  ?   Heart sounds: Normal heart sounds. No murmur heard. ?Pulmonary:  ?   Effort: Pulmonary effort is normal. No respiratory distress.  ?   Breath sounds: Normal breath sounds.  No wheezing or rales.  ?Musculoskeletal:  ?   Cervical back: Neck supple.  ?Lymphadenopathy:  ?   Cervical: No cervical adenopathy.  ? ? ? ? ? ?   ?Assessment & Plan:  ?Prediabetes - Plan: Hemoglobin A1c ? ?Benign essential HTN - Plan: CBC with Differential/Platelet, Lipid panel, COMPLETE METABOLIC PANEL WITH GFR ? ?CAD in native artery - Plan: CBC with Differential/Platelet, Lipid panel, COMPLETE METABOLIC PANEL WITH GFR ? ?  Prostate cancer screening ? ?Neuropathy - Plan: Vitamin B12, TSH ? ?Idiopathic peripheral neuropathy - Plan: TSH ?I believe the patient has peripheral neuropathy coupled with chronic venous insufficiency.  I will start him on Lyrica 75 mg twice daily to see if this will help with the nerve pain.  I also recommended that he take horse Chestnut seed extract at night to see if this would help with any pain and heaviness and discomfort due to his varicose veins.  While the patient is here on a check an A1c to monitor his diabetes and I will also check for vitamin deficiencies such as vitamin B12 that can contribute to neuropathy.  I will screen for thyroid issues with a TSH.  Also check a fasting lipid panel.  Given his history of coronary artery disease, his goal LDL cholesterol is less than 70.  I have sent a refill in for Entresto.  If his insurance denies this again, hopefully we can get this approved based on his echocardiogram report from 2017.  If the neuropathy does not improve with Lyrica, we can certainly refer the patient to neurology for nerve conduction studies to verify the diagnosis. ?

## 2021-03-18 LAB — LIPID PANEL
Cholesterol: 131 mg/dL (ref <200)
HDL: 51 mg/dL (ref >=40)
LDL Cholesterol (Calc): 62 mg/dL (calc)
Non-HDL Cholesterol (Calc): 80 mg/dL (calc) (ref <130)
Total CHOL/HDL Ratio: 2.6 (calc) (ref <5.0)
Triglycerides: 99 mg/dL (ref <150)

## 2021-03-18 LAB — CBC WITH DIFFERENTIAL/PLATELET
Absolute Monocytes: 686 cells/uL (ref 200–950)
Basophils Absolute: 62 cells/uL (ref 0–200)
Basophils Relative: 0.7 %
Eosinophils Absolute: 334 cells/uL (ref 15–500)
Eosinophils Relative: 3.8 %
HCT: 49.7 % (ref 38.5–50.0)
Hemoglobin: 16.2 g/dL (ref 13.2–17.1)
Lymphs Abs: 3106 cells/uL (ref 850–3900)
MCH: 27.5 pg (ref 27.0–33.0)
MCHC: 32.6 g/dL (ref 32.0–36.0)
MCV: 84.4 fL (ref 80.0–100.0)
MPV: 9.4 fL (ref 7.5–12.5)
Monocytes Relative: 7.8 %
Neutro Abs: 4611 cells/uL (ref 1500–7800)
Neutrophils Relative %: 52.4 %
Platelets: 278 10*3/uL (ref 140–400)
RBC: 5.89 10*6/uL — ABNORMAL HIGH (ref 4.20–5.80)
RDW: 13.4 % (ref 11.0–15.0)
Total Lymphocyte: 35.3 %
WBC: 8.8 10*3/uL (ref 3.8–10.8)

## 2021-03-18 LAB — COMPLETE METABOLIC PANEL WITH GFR
AG Ratio: 1.6 (calc) (ref 1.0–2.5)
ALT: 21 U/L (ref 9–46)
AST: 20 U/L (ref 10–35)
Albumin: 4.2 g/dL (ref 3.6–5.1)
Alkaline phosphatase (APISO): 120 U/L (ref 35–144)
BUN: 16 mg/dL (ref 7–25)
CO2: 24 mmol/L (ref 20–32)
Calcium: 9.3 mg/dL (ref 8.6–10.3)
Chloride: 105 mmol/L (ref 98–110)
Creat: 1.14 mg/dL (ref 0.70–1.28)
Globulin: 2.6 g/dL (calc) (ref 1.9–3.7)
Glucose, Bld: 108 mg/dL — ABNORMAL HIGH (ref 65–99)
Potassium: 4.8 mmol/L (ref 3.5–5.3)
Sodium: 142 mmol/L (ref 135–146)
Total Bilirubin: 0.8 mg/dL (ref 0.2–1.2)
Total Protein: 6.8 g/dL (ref 6.1–8.1)
eGFR: 69 mL/min/{1.73_m2} (ref >=60)

## 2021-03-18 LAB — VITAMIN B12: Vitamin B-12: 237 pg/mL (ref 200–1100)

## 2021-03-18 LAB — HEMOGLOBIN A1C
Hgb A1c MFr Bld: 6.6 % of total Hgb — ABNORMAL HIGH (ref <5.7)
Mean Plasma Glucose: 143 mg/dL
eAG (mmol/L): 7.9 mmol/L

## 2021-03-18 LAB — TSH: TSH: 1.05 mIU/L (ref 0.40–4.50)

## 2021-03-20 ENCOUNTER — Telehealth: Payer: Self-pay | Admitting: Family Medicine

## 2021-03-20 ENCOUNTER — Other Ambulatory Visit: Payer: Self-pay

## 2021-03-20 DIAGNOSIS — I5022 Chronic systolic (congestive) heart failure: Secondary | ICD-10-CM

## 2021-03-20 MED ORDER — ENTRESTO 49-51 MG PO TABS: 1.0000 | ORAL_TABLET | Freq: Two times a day (BID) | ORAL | 3 refills | Status: DC

## 2021-03-20 NOTE — Telephone Encounter (Signed)
Patient called to follow up on refill request for sacubitril-valsartan (ENTRESTO) 49-51 MG [[id number]]  ? ?States refill declined by pharmacy ; prior auth needed. ? ?Pharmacy confirmed as  ? ?[id number] - [address]  ?[place], [place] [address]  ?Phone:  [phone]  Fax:  [phone]  ? ?Please advise at [phone]. ?

## 2021-03-20 NOTE — Addendum Note (Signed)
Addended by: Colman Cater on: 03/20/2021 02:03 PM ? ? Modules accepted: Orders ? ?

## 2021-03-22 ENCOUNTER — Telehealth: Payer: Medicare Other | Admitting: Cardiology

## 2021-03-22 DIAGNOSIS — I5022 Chronic systolic (congestive) heart failure: Secondary | ICD-10-CM

## 2021-03-22 NOTE — Telephone Encounter (Signed)
Left message making patient aware that Dr. Nelly Laurence nurse is aware of the prior authorization needed for entresto and that in the meantime he could come by the [facility] office to pick up sample. ?

## 2021-03-22 NOTE — Telephone Encounter (Signed)
Prior Authorization denied x2- office notes sent- denied again. Please advise.  ?

## 2021-03-22 NOTE — Telephone Encounter (Signed)
New Message: ? ? ? ? ? ?Patient's wife called. She said patient have been without his Entresto for a week, Wife said the insurance said his Delene Loll was denied> She said that she was told that it was denied, because paperwork had not been completed by Dr Pamala Hurry. She wants to know what should the patient do about his Entresto.? He needs this asap please. ?

## 2021-03-23 NOTE — Telephone Encounter (Signed)
Per CoverMyMeds and BCBS this medication DOES NOT require PA. ?  ?Per pharmacy, message received states to call [facility] to initiate PA. Spoke with rep at [place] [phone], and they state PA has already been initiated by Dr. Nelly Laurence office and has been denied x2 due to lack of clinical documentation.  ?  ?Spoke with patient and advised him we cannot initiate another PA at this time. He would need to contact Dr. Nelly Laurence office to inquire about PA status. He states he has already spoken with them and they have not been very helpful. I advised him I could see their notes in his chart and it does show they have been working on his PA since 03/02/21, with 2 denials and they have now filed an appeal. Patient is upset that no one has communicated this to him. I apologized, and tried to explain that the PA process can be lengthy and cumbersome. Patient will contact Dr. Nelly Laurence office tomorrow to check status.  ?  ?Message sent to Dr. Nelly Laurence office to advise. Nothing further needed at this time.  ? ?FYI - patient attempted to have [place] refill this medication, hoping we could get PA completed faster. Advised patient he would need to continue to work with your office on this. ?Thanks! ?

## 2021-03-23 NOTE — Telephone Encounter (Signed)
Appeal for prior authorization sent per pt's request.  ?

## 2021-03-23 NOTE — Telephone Encounter (Signed)
Per CoverMyMeds and BCBS this medication DOES NOT require PA. ? ?Per pharmacy, message received states to call [facility] to initiate PA. Spoke with rep at [employer] and they states PA has already been initiated by Dr. Nelly Laurence office and has been denied x2 due to lack of clinical documentation.  ? ?Spoke with patient and advised him we cannot initiate another PA at this time. He would need to contact Dr. Nelly Laurence office to inquire about PA status. He states he has already spoken with them and they have not been very helpful. I advised him I could see their notes in his chart and it does show they have been working on his PA since 03/02/21, with 2 denials and they have now filed an appeal. Patient is upset that no one has communicated this to him. I apologized, and tried to explain that the PA process can be lengthy and cumbersome. Patient will contact Dr. Nelly Laurence office tomorrow to check status.  ? ?Message sent to Dr. Nelly Laurence office to advise. Nothing further needed at this time.  ? ?

## 2021-03-27 MED ORDER — LOSARTAN POTASSIUM 50 MG PO TABS: 50.0000 mg | ORAL_TABLET | Freq: Every day | ORAL | 3 refills | Status: DC

## 2021-03-27 NOTE — Telephone Encounter (Signed)
Left a message for pt to call office back.  °

## 2021-03-27 NOTE — Telephone Encounter (Signed)
Per Dr. Harl Bowie (via secure chat): ? ? ?Insurance is turning down his entresto despite multiple applications, we cannot force them to approve the medication. I would d/c entresto and start losartan '50mg'$  daily. We can repeat an echo to reassess his heart function, if function has continued to improve as similar to prior pattern then we can just stick with losartan. If some decline in LV function then we have a stronger argument to appeal again with insurance company ?

## 2021-03-27 NOTE — Telephone Encounter (Signed)
Pt notified and verbalized understanding.

## 2021-04-13 ENCOUNTER — Other Ambulatory Visit (HOSPITAL_COMMUNITY): Payer: BC Managed Care – PPO

## 2021-04-25 ENCOUNTER — Ambulatory Visit (INDEPENDENT_AMBULATORY_CARE_PROVIDER_SITE_OTHER): Payer: BC Managed Care – PPO

## 2021-04-25 DIAGNOSIS — I5022 Chronic systolic (congestive) heart failure: Secondary | ICD-10-CM

## 2021-04-25 LAB — ECHOCARDIOGRAM COMPLETE
AR max vel: 2.66 cm2
AV Area VTI: 2.19 cm2
AV Area mean vel: 2.08 cm2
AV Mean grad: 5 mmHg
AV Peak grad: 9 mmHg
Ao pk vel: 1.5 m/s
Area-P 1/2: 3.43 cm2
Calc EF: 39.3 %
S' Lateral: 4.75 cm
Single Plane A2C EF: 41.8 %
Single Plane A4C EF: 37.7 %

## 2021-05-01 ENCOUNTER — Other Ambulatory Visit: Payer: Self-pay | Admitting: Cardiology

## 2021-05-01 DIAGNOSIS — H903 Sensorineural hearing loss, bilateral: Secondary | ICD-10-CM | POA: Diagnosis not present

## 2021-05-01 DIAGNOSIS — R42 Dizziness and giddiness: Secondary | ICD-10-CM | POA: Diagnosis not present

## 2021-05-01 DIAGNOSIS — H6123 Impacted cerumen, bilateral: Secondary | ICD-10-CM | POA: Diagnosis not present

## 2021-05-08 ENCOUNTER — Telehealth: Payer: Self-pay

## 2021-05-08 DIAGNOSIS — I5022 Chronic systolic (congestive) heart failure: Secondary | ICD-10-CM

## 2021-05-08 NOTE — Telephone Encounter (Signed)
-----   Message from Arnoldo Lenis, MD sent at 05/08/2021  3:49 PM EDT ----- ?Limited visualization on echo, can we do a limited with contrast to better evaluate LV function please ? ? ?Zandra Abts MD ?

## 2021-05-08 NOTE — Telephone Encounter (Signed)
Patient notified and verbalized understanding. Patient agreeable to having limited echo with [facility].  ?

## 2021-05-24 ENCOUNTER — Ambulatory Visit (HOSPITAL_COMMUNITY)
Admission: RE | Admit: 2021-05-24 | Discharge: 2021-05-24 | Disposition: A | Discharge status: Home / Self Care | Payer: BC Managed Care – PPO | Source: Ambulatory Visit | Attending: Cardiology | Admitting: Cardiology

## 2021-05-24 DIAGNOSIS — I5022 Chronic systolic (congestive) heart failure: Secondary | ICD-10-CM | POA: Insufficient documentation

## 2021-05-24 LAB — ECHOCARDIOGRAM LIMITED: S' Lateral: 4.7 cm

## 2021-05-24 MED ORDER — PERFLUTREN LIPID MICROSPHERE
1.0000 mL | INTRAVENOUS | Status: AC | PRN
  Administered 2021-05-24: 3 mL via INTRAVENOUS

## 2021-05-24 NOTE — Progress Notes (Signed)
*  PRELIMINARY RESULTS* Echocardiogram Limited 2-D Echocardiogram  has been performed with Definity.  Benjamin [name] 05/24/2021, 2:54 PM

## 2021-06-04 ENCOUNTER — Emergency Department (HOSPITAL_BASED_OUTPATIENT_CLINIC_OR_DEPARTMENT_OTHER)
Admission: EM | Admit: 2021-06-04 | Discharge: 2021-06-04 | Discharge status: Against Medical Advice | Payer: Medicare Other | Source: Home / Self Care

## 2021-06-04 ENCOUNTER — Other Ambulatory Visit: Payer: Self-pay

## 2021-06-14 ENCOUNTER — Encounter: Payer: Self-pay | Admitting: Cardiology

## 2021-06-14 ENCOUNTER — Ambulatory Visit (INDEPENDENT_AMBULATORY_CARE_PROVIDER_SITE_OTHER): Payer: BC Managed Care – PPO | Admitting: Cardiology

## 2021-06-14 VITALS — BP 108/68 | HR 60 | Ht 70.0 in | Wt 277.8 lb

## 2021-06-14 DIAGNOSIS — I2510 Atherosclerotic heart disease of native coronary artery without angina pectoris: Secondary | ICD-10-CM

## 2021-06-14 DIAGNOSIS — I5022 Chronic systolic (congestive) heart failure: Secondary | ICD-10-CM | POA: Diagnosis not present

## 2021-06-14 MED ORDER — SPIRONOLACTONE 25 MG PO TABS: 12.5000 mg | ORAL_TABLET | Freq: Every day | ORAL | 1 refills | Status: DC

## 2021-06-14 MED ORDER — ENTRESTO 24-26 MG PO TABS: 1.0000 | ORAL_TABLET | Freq: Two times a day (BID) | ORAL | 1 refills | Status: DC

## 2021-06-14 NOTE — Progress Notes (Signed)
Clinical Summary Benjamin [name] is a 71 y.o.male seen today for follow up of the following medical problems.        1. CAD - previous notes mention MI in 2001 at [facility], cannot find cath report. Patient has stent card that shows he received a stent to his LAD at that time.   - repeat cath 05/2015 that showed patent coronaries.  This was down after he was found to have a drop in LVEF   - no recent chest pains         2. Chronic systolic heart failure/NICM -  05/2015 echo showed an LVEF 25-30%, this is a new finding of systolic dysfunction. - 05/2015 cath showed patent coronaries. Normal filling pressures at that time.    - repeat echo 09/2015, LVEF improved to 45-50%. Grade I diasotlic dysfunction.    - coreg dosing has been limited by low bp's and HRs - 05/2021 echo LVEF 25-30%, indicating recurrent systolic dysfunction - insurance had turned down entresto. Ran out 03/01/21, started losartan '50mg'$  daily.  - no recent chest pain, no SOB/DOE, no LE edema    3. OSA  - followed by Dr Radford Pax - remain compliant with cpap.    4. HL -04/2017 lipids at goal, compliant with statin.    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    Past Medical History:  Diagnosis Date   Allergy    ASCVD (arteriosclerotic cardiovascular disease)    Diabetes mellitus without complication ([place])    prediabetes   Hyperlipidemia    Hypertension    Myocardial infarction ([place]) 04/20/1999   Obesity (BMI 30-39.9)    OSA (obstructive sleep apnea) 11/02/2015     Allergies  Allergen Reactions   Penicillins Swelling     Current Outpatient Medications  Medication Sig Dispense Refill   aspirin 81 MG tablet Take 81 mg by mouth daily. Reported on 05/05/2015     atorvastatin (LIPITOR) 80 MG tablet TAKE ONE TABLET BY MOUTH EVERY EVENING AT 6PM 90 tablet 3   carvedilol (COREG) 3.125 MG  tablet TAKE ONE TABLET BY MOUTH TWICE A DAY 180 tablet 3   Cholecalciferol (VITAMIN D) 2000 UNITS CAPS Take 1 capsule by mouth daily.      gabapentin (NEURONTIN) 300 MG capsule Take 1 capsule (300 mg total) by mouth 3 (three) times daily as needed (NERVE PAIN). (Patient not taking: Reported on 03/17/2021) 90 capsule 3   losartan (COZAAR) 50 MG tablet Take 1 tablet (50 mg total) by mouth daily. 90 tablet 3   pregabalin (LYRICA) 75 MG capsule Take 1 capsule (75 mg total) by mouth 2 (two) times daily. For neuropathy 60 capsule 3   No current facility-administered medications for this visit.     Past Surgical History:  Procedure Laterality Date   CARDIAC CATHETERIZATION N/A 05/19/2015   Procedure: Right/Left Heart Cath and Coronary Angiography;  Surgeon: Belva Crome, MD;  Location: [facility];  Service: Cardiovascular;  Laterality: N/A;   CORONARY STENT PLACEMENT  04/18/1999     Allergies  Allergen Reactions   Penicillins Swelling      Family History  Problem Relation Age of Onset   AAA (abdominal aortic aneurysm) Father      Social History Mr. Janssen reports that he quit  smoking about 46 years ago. His smoking use included cigarettes. He started smoking about 54 years ago. He has a 4.00 pack-year smoking history. He has been exposed to tobacco smoke. He has never used smokeless tobacco. Mr. Schippers reports no history of alcohol use.   Review of Systems CONSTITUTIONAL: No weight loss, fever, chills, weakness or fatigue.  HEENT: Eyes: No visual loss, blurred vision, double vision or yellow sclerae.No hearing loss, sneezing, congestion, runny nose or sore throat.  SKIN: No rash or itching.  CARDIOVASCULAR: per hpi RESPIRATORY: No shortness of breath, cough or sputum.  GASTROINTESTINAL: No anorexia, nausea, vomiting or diarrhea. No abdominal pain or blood.  GENITOURINARY: No burning on urination, no polyuria NEUROLOGICAL: No headache, dizziness, syncope, paralysis,  ataxia, numbness or tingling in the extremities. No change in bowel or bladder control.  MUSCULOSKELETAL: No muscle, back pain, joint pain or stiffness.  LYMPHATICS: No enlarged nodes. No history of splenectomy.  PSYCHIATRIC: No history of depression or anxiety.  ENDOCRINOLOGIC: No reports of sweating, cold or heat intolerance. No polyuria or polydipsia.  Marland Kitchen   Physical Examination Today's Vitals   06/14/21 0909  BP: 108/68  Pulse: 60  SpO2: 96%  Weight: 277 lb 12.8 oz (126 kg)  Height: '5\' 10"'$  (1.778 m)   Body mass index is 39.86 kg/m.  Gen: resting comfortably, no acute distress HEENT: no scleral icterus, pupils equal round and reactive, no palptable cervical adenopathy,  CV: RRR, no mr/g no jvd Resp: Clear to auscultation bilaterally GI: abdomen is soft, non-tender, non-distended, normal bowel sounds, no hepatosplenomegaly MSK: extremities are warm, no edema.  Skin: warm, no rash Neuro:  no focal deficits Psych: appropriate affect   Diagnostic Studies  05/2015 echo Study Conclusions   - Left ventricle: The cavity size was normal. Wall thickness was   normal. Systolic function was severely reduced. The estimated   ejection fraction was in the range of 25% to 30%. Diffuse   hypokinesis. Doppler parameters are consistent with abnormal left   ventricular relaxation (grade 1 diastolic dysfunction). - Regional wall motion abnormality: Akinesis of the mid anterior,   basal-mid anteroseptal, and apical myocardium. - Aortic valve: Mildly calcified annulus. Trileaflet; mildly   thickened leaflets. Valve area (VTI): 2.42 cm^2. Valve area   (Vmax): 2.32 cm^2. - Atrial septum: No defect or patent foramen ovale was identified. - Technically difficult study. Echocontrast was used to enhance   visualization.         05/2015 cath Prox RCA to Mid RCA lesion, 20% stenosed. Ost LAD to Prox LAD lesion, 30% stenosed. The lesion was previously treated with a stent (unknown type). Dist  Cx lesion, 50% stenosed.   Severe anterior apical hypokinesis. Estimated ejection fraction 35%. Normal left ventricular hemodynamics with EDP of 14 mmHg and mean capillary wedge 17 mmHg. Widely patent coronary arteries with 30% diffuse ISR in the proximal LAD stent. There is 50% eccentric stenosis in the distal circumflex. Luminal irregularities are noted in the right coronary.   Post cath RECOMMENDATIONS:   Aggressive risk factor modification to prevent progression of CAD Aggressive heart failure therapy to minimize progression of LV dysfunction.     09/2015 echo Study Conclusions   - Left ventricle: The cavity size was normal. Wall thickness was   normal. Systolic function was mildly reduced. The estimated   ejection fraction was in the range of 45% to 50%. Doppler   parameters are consistent with abnormal left ventricular   relaxation (grade 1 diastolic dysfunction). - Regional wall  motion abnormality: Hypokinesis of the mid   anterior, mid anteroseptal, and apical myocardium. - Aortic valve: Mildly calcified annulus. Trileaflet; mildly   thickened leaflets. Valve area (VTI): 3.07 cm^2. Valve area   (Vmax): 2.7 cm^2. Valve area (Vmean): 2.7 cm^2. - Technically difficult study.     04/2021 echo IMPRESSIONS     1. LV endocardium poorly visualized. Roughly LVEF is in the 40-50% range.  Recommend limited study with echocontrast for more precise estimate. .  Left ventricular ejection fraction, by estimation, is 40-50%. The left  ventricle has mildly decreased to low   normal function. Left ventricular endocardial border not optimally  defined to evaluate regional wall motion. Left ventricular diastolic  parameters are consistent with Grade I diastolic dysfunction (impaired  relaxation).   2. Right ventricular systolic function is normal. The right ventricular  size is normal. Tricuspid regurgitation signal is inadequate for assessing  PA pressure.   3. The mitral valve was not  well visualized. No evidence of mitral valve  regurgitation. No evidence of mitral stenosis.   4. The aortic valve is tricuspid. Aortic valve regurgitation is not  visualized. No aortic stenosis is present.   5. The inferior vena cava is normal in size with greater than 50%  respiratory variability, suggesting right atrial pressure of 3 mmHg.   Comparison(s): LVEF 45-50%.   05/2021 limited echo 1. Limited study.   2. Left ventricular ejection fraction, by estimation, is 25 to 30%. The  left ventricle has severely decreased function. The left ventricle  demonstrates regional wall motion abnormalities (see scoring  diagram/findings for description). The left  ventricular internal cavity size was mildly dilated.   3. Definity contrast shows slow flow and loosely organized thrombotic  material at LV apex, although no clearly formed mural thrombus.   4. Right ventricular systolic function is normal. The right ventricular  size is normal.    Assessment and Plan  1.Chronic systolic heart failure - recurrent LV dysfunction, previously had normalized. Most recent echo shows LVEF back down to 25-30% - history of CAD, last cath 2017. Will need repeat RHC/LHC in setting of recurrent systolic dysfunction - add aldactone 12.'5mg'$  daily. Had been on entresto but insurance denies despite appeals, with recurrent systolic dysfunction will reapply. Continue losartan for now - farxiga in the near future once we sort out entresto approval    2. CAD -no symptoms, continue current mes   F/u 2 months  Shared Decision Making/Informed Consent The risks [stroke (1 in 1000), death (1 in 62), kidney failure [usually temporary] (1 in 500), bleeding (1 in 200), allergic reaction [possibly serious] (1 in 200)], benefits (diagnostic support and management of coronary artery disease) and alternatives of a cardiac catheterization were discussed in detail with Mr. Fawcett and he is willing to  proceed.     Arnoldo Lenis, M.D.

## 2021-06-14 NOTE — H&P (View-Only) (Signed)
Clinical Summary Mr. Helmstetter is a 71 y.o.male seen today for follow up of the following medical problems.        1. CAD - previous notes mention MI in 2001 at [facility], cannot find cath report. Patient has stent card that shows he received a stent to his LAD at that time.   - repeat cath 05/2015 that showed patent coronaries.  This was down after he was found to have a drop in LVEF   - no recent chest pains         2. Chronic systolic heart failure/NICM -  05/2015 echo showed an LVEF 25-30%, this is a new finding of systolic dysfunction. - 05/2015 cath showed patent coronaries. Normal filling pressures at that time.    - repeat echo 09/2015, LVEF improved to 45-50%. Grade I diasotlic dysfunction.    - coreg dosing has been limited by low bp's and HRs - 05/2021 echo LVEF 25-30%, indicating recurrent systolic dysfunction - insurance had turned down entresto. Ran out 03/01/21, started losartan '50mg'$  daily.  - no recent chest pain, no SOB/DOE, no LE edema    3. OSA  - followed by Dr Radford Pax - remain compliant with cpap.    4. HL -04/2017 lipids at goal, compliant with statin.    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    Past Medical History:  Diagnosis Date   Allergy    ASCVD (arteriosclerotic cardiovascular disease)    Diabetes mellitus without complication ([place])    prediabetes   Hyperlipidemia    Hypertension    Myocardial infarction ([place]) 04/20/1999   Obesity (BMI 30-39.9)    OSA (obstructive sleep apnea) 11/02/2015     Allergies  Allergen Reactions   Penicillins Swelling     Current Outpatient Medications  Medication Sig Dispense Refill   aspirin 81 MG tablet Take 81 mg by mouth daily. Reported on 05/05/2015     atorvastatin (LIPITOR) 80 MG tablet TAKE ONE TABLET BY MOUTH EVERY EVENING AT 6PM 90 tablet 3   carvedilol (COREG) 3.125 MG  tablet TAKE ONE TABLET BY MOUTH TWICE A DAY 180 tablet 3   Cholecalciferol (VITAMIN D) 2000 UNITS CAPS Take 1 capsule by mouth daily.      gabapentin (NEURONTIN) 300 MG capsule Take 1 capsule (300 mg total) by mouth 3 (three) times daily as needed (NERVE PAIN). (Patient not taking: Reported on 03/17/2021) 90 capsule 3   losartan (COZAAR) 50 MG tablet Take 1 tablet (50 mg total) by mouth daily. 90 tablet 3   pregabalin (LYRICA) 75 MG capsule Take 1 capsule (75 mg total) by mouth 2 (two) times daily. For neuropathy 60 capsule 3   No current facility-administered medications for this visit.     Past Surgical History:  Procedure Laterality Date   CARDIAC CATHETERIZATION N/A 05/19/2015   Procedure: Right/Left Heart Cath and Coronary Angiography;  Surgeon: Belva Crome, MD;  Location: [facility];  Service: Cardiovascular;  Laterality: N/A;   CORONARY STENT PLACEMENT  04/18/1999     Allergies  Allergen Reactions   Penicillins Swelling      Family History  Problem Relation Age of Onset   AAA (abdominal aortic aneurysm) Father      Social History Mr. Becvar reports that he quit  smoking about 46 years ago. His smoking use included cigarettes. He started smoking about 54 years ago. He has a 4.00 pack-year smoking history. He has been exposed to tobacco smoke. He has never used smokeless tobacco. Mr. Formica reports no history of alcohol use.   Review of Systems CONSTITUTIONAL: No weight loss, fever, chills, weakness or fatigue.  HEENT: Eyes: No visual loss, blurred vision, double vision or yellow sclerae.No hearing loss, sneezing, congestion, runny nose or sore throat.  SKIN: No rash or itching.  CARDIOVASCULAR: per hpi RESPIRATORY: No shortness of breath, cough or sputum.  GASTROINTESTINAL: No anorexia, nausea, vomiting or diarrhea. No abdominal pain or blood.  GENITOURINARY: No burning on urination, no polyuria NEUROLOGICAL: No headache, dizziness, syncope, paralysis,  ataxia, numbness or tingling in the extremities. No change in bowel or bladder control.  MUSCULOSKELETAL: No muscle, back pain, joint pain or stiffness.  LYMPHATICS: No enlarged nodes. No history of splenectomy.  PSYCHIATRIC: No history of depression or anxiety.  ENDOCRINOLOGIC: No reports of sweating, cold or heat intolerance. No polyuria or polydipsia.  Marland Kitchen   Physical Examination Today's Vitals   06/14/21 0909  BP: 108/68  Pulse: 60  SpO2: 96%  Weight: 277 lb 12.8 oz (126 kg)  Height: '5\' 10"'$  (1.778 m)   Body mass index is 39.86 kg/m.  Gen: resting comfortably, no acute distress HEENT: no scleral icterus, pupils equal round and reactive, no palptable cervical adenopathy,  CV: RRR, no mr/g no jvd Resp: Clear to auscultation bilaterally GI: abdomen is soft, non-tender, non-distended, normal bowel sounds, no hepatosplenomegaly MSK: extremities are warm, no edema.  Skin: warm, no rash Neuro:  no focal deficits Psych: appropriate affect   Diagnostic Studies  05/2015 echo Study Conclusions   - Left ventricle: The cavity size was normal. Wall thickness was   normal. Systolic function was severely reduced. The estimated   ejection fraction was in the range of 25% to 30%. Diffuse   hypokinesis. Doppler parameters are consistent with abnormal left   ventricular relaxation (grade 1 diastolic dysfunction). - Regional wall motion abnormality: Akinesis of the mid anterior,   basal-mid anteroseptal, and apical myocardium. - Aortic valve: Mildly calcified annulus. Trileaflet; mildly   thickened leaflets. Valve area (VTI): 2.42 cm^2. Valve area   (Vmax): 2.32 cm^2. - Atrial septum: No defect or patent foramen ovale was identified. - Technically difficult study. Echocontrast was used to enhance   visualization.         05/2015 cath Prox RCA to Mid RCA lesion, 20% stenosed. Ost LAD to Prox LAD lesion, 30% stenosed. The lesion was previously treated with a stent (unknown type). Dist  Cx lesion, 50% stenosed.   Severe anterior apical hypokinesis. Estimated ejection fraction 35%. Normal left ventricular hemodynamics with EDP of 14 mmHg and mean capillary wedge 17 mmHg. Widely patent coronary arteries with 30% diffuse ISR in the proximal LAD stent. There is 50% eccentric stenosis in the distal circumflex. Luminal irregularities are noted in the right coronary.   Post cath RECOMMENDATIONS:   Aggressive risk factor modification to prevent progression of CAD Aggressive heart failure therapy to minimize progression of LV dysfunction.     09/2015 echo Study Conclusions   - Left ventricle: The cavity size was normal. Wall thickness was   normal. Systolic function was mildly reduced. The estimated   ejection fraction was in the range of 45% to 50%. Doppler   parameters are consistent with abnormal left ventricular   relaxation (grade 1 diastolic dysfunction). - Regional wall  motion abnormality: Hypokinesis of the mid   anterior, mid anteroseptal, and apical myocardium. - Aortic valve: Mildly calcified annulus. Trileaflet; mildly   thickened leaflets. Valve area (VTI): 3.07 cm^2. Valve area   (Vmax): 2.7 cm^2. Valve area (Vmean): 2.7 cm^2. - Technically difficult study.     04/2021 echo IMPRESSIONS     1. LV endocardium poorly visualized. Roughly LVEF is in the 40-50% range.  Recommend limited study with echocontrast for more precise estimate. .  Left ventricular ejection fraction, by estimation, is 40-50%. The left  ventricle has mildly decreased to low   normal function. Left ventricular endocardial border not optimally  defined to evaluate regional wall motion. Left ventricular diastolic  parameters are consistent with Grade I diastolic dysfunction (impaired  relaxation).   2. Right ventricular systolic function is normal. The right ventricular  size is normal. Tricuspid regurgitation signal is inadequate for assessing  PA pressure.   3. The mitral valve was not  well visualized. No evidence of mitral valve  regurgitation. No evidence of mitral stenosis.   4. The aortic valve is tricuspid. Aortic valve regurgitation is not  visualized. No aortic stenosis is present.   5. The inferior vena cava is normal in size with greater than 50%  respiratory variability, suggesting right atrial pressure of 3 mmHg.   Comparison(s): LVEF 45-50%.   05/2021 limited echo 1. Limited study.   2. Left ventricular ejection fraction, by estimation, is 25 to 30%. The  left ventricle has severely decreased function. The left ventricle  demonstrates regional wall motion abnormalities (see scoring  diagram/findings for description). The left  ventricular internal cavity size was mildly dilated.   3. Definity contrast shows slow flow and loosely organized thrombotic  material at LV apex, although no clearly formed mural thrombus.   4. Right ventricular systolic function is normal. The right ventricular  size is normal.    Assessment and Plan  1.Chronic systolic heart failure - recurrent LV dysfunction, previously had normalized. Most recent echo shows LVEF back down to 25-30% - history of CAD, last cath 2017. Will need repeat RHC/LHC in setting of recurrent systolic dysfunction - add aldactone 12.'5mg'$  daily. Had been on entresto but insurance denies despite appeals, with recurrent systolic dysfunction will reapply. Continue losartan for now - farxiga in the near future once we sort out entresto approval    2. CAD -no symptoms, continue current mes   F/u 2 months  Shared Decision Making/Informed Consent The risks [stroke (1 in 1000), death (1 in 65), kidney failure [usually temporary] (1 in 500), bleeding (1 in 200), allergic reaction [possibly serious] (1 in 200)], benefits (diagnostic support and management of coronary artery disease) and alternatives of a cardiac catheterization were discussed in detail with Mr. Genova and he is willing to  proceed.     Arnoldo Lenis, M.D.

## 2021-06-14 NOTE — Patient Instructions (Addendum)
Medication Instructions:  Your physician recommends that you continue on your current medications as directed. Please refer to the Current Medication list given to you today.   Labwork: CBC, BMP [facility])  Testing/Procedures: Your physician has requested that you have a cardiac catheterization. Cardiac catheterization is used to diagnose and/or treat various heart conditions. Doctors may recommend this procedure for a number of different reasons. The most common reason is to evaluate chest pain. Chest pain can be a symptom of coronary artery disease (CAD), and cardiac catheterization can show whether plaque is narrowing or blocking your heart's arteries. This procedure is also used to evaluate the valves, as well as measure the blood flow and oxygen levels in different parts of your heart. For further information please visit [email or web address]. Please follow instruction sheet, as given.   Benjamin [name] [id number] Dept: [phone] Loc: [place]  06/14/2021  You are scheduled for a Cardiac Catheterization on Wednesday, June 21 with Dr. Lenna Sciara.  1. Please arrive at the Main Entrance A at [facility]: [place], [place] [address] at 8:00 AM (This time is two hours before your procedure to ensure your preparation). Free valet parking service is available.   Special note: Every effort is made to have your procedure done on time. Please understand that emergencies sometimes delay scheduled procedures.  2. Diet: Do not eat solid foods after midnight.  You may have clear liquids until 5 AM upon the day of the procedure.  3. Labs: You will need to have blood drawn on TODAY. You do not need to be fasting.  4. Medication instructions in preparation for your procedure: None   Contrast Allergy: No   Current Outpatient  Medications (Cardiovascular):    atorvastatin (LIPITOR) 80 MG tablet, TAKE ONE TABLET BY MOUTH EVERY EVENING AT 6PM   carvedilol (COREG) 3.125 MG tablet, TAKE ONE TABLET BY MOUTH TWICE A DAY   losartan (COZAAR) 50 MG tablet, Take 1 tablet (50 mg total) by mouth daily.   Current Outpatient Medications (Analgesics):    aspirin 81 MG tablet, Take 81 mg by mouth daily. Reported on 05/05/2015   Current Outpatient Medications (Other):    Cholecalciferol (VITAMIN D) 2000 UNITS CAPS, Take 1 capsule by mouth daily.    pregabalin (LYRICA) 75 MG capsule, Take 1 capsule (75 mg total) by mouth 2 (two) times daily. For neuropathy   gabapentin (NEURONTIN) 300 MG capsule, Take 1 capsule (300 mg total) by mouth 3 (three) times daily as needed (NERVE PAIN). (Patient not taking: Reported on 03/17/2021) On the morning of your procedure, take Aspirin and any morning medicines NOT listed above.  You may use sips of water.  5. Plan to go home the same day, you will only stay overnight if medically necessary. 6. You MUST have a responsible adult to drive you home. 7. An adult MUST be with you the first 24 hours after you arrive home. 8. Bring a current list of your medications, and the last time and date medication taken. 9. Bring ID and current insurance cards. 10.Please wear clothes that are easy to get on and off and wear slip-on shoes.  Thank you for allowing [place] to care for you!   -- [facility]   Follow-Up: Your physician recommends that you schedule a follow-up appointment in: 2 months  Any Other Special Instructions  Will Be Listed Below (If Applicable).  If you need a refill on your cardiac medications before your next appointment, please call your pharmacy.

## 2021-06-19 ENCOUNTER — Telehealth: Payer: Self-pay | Admitting: Cardiology

## 2021-06-19 NOTE — Telephone Encounter (Signed)
    I do not need this encounter

## 2021-06-20 ENCOUNTER — Telehealth: Payer: Self-pay | Admitting: *Deleted

## 2021-06-20 ENCOUNTER — Encounter: Payer: Self-pay | Admitting: *Deleted

## 2021-06-20 NOTE — Telephone Encounter (Signed)
Reviewed procedure instructions with patient.  

## 2021-06-20 NOTE — Telephone Encounter (Addendum)
Cardiac Catheterization scheduled at [facility] for: Wednesday June 20, 2021 10 AM Arrival time and place: [facility] Main Entrance A at: 8 AM   Nothing to eat after midnight prior to procedure, clear liquids until 5 AM day of procedure.  Medication instructions: -Hold:  Spironolactone-AM of procedure -Except hold medications usual morning medications can be taken with sips of water including aspirin 81 mg.  Confirmed patient has responsible adult to drive home post procedure and be with patient first 24 hours after arriving home.  Patient reports no new symptoms concerning for COVID-19 in the past 10 days.  Reviewed procedure instructions with patient.

## 2021-06-20 NOTE — Telephone Encounter (Signed)
Pt returning nurses call regarding instructions for procedure. Call transferred

## 2021-06-21 ENCOUNTER — Encounter (HOSPITAL_COMMUNITY)
Admission: RE | Disposition: A | Discharge status: Home / Self Care | Payer: Self-pay | Source: Ambulatory Visit | Attending: Internal Medicine

## 2021-06-21 ENCOUNTER — Other Ambulatory Visit: Payer: Self-pay

## 2021-06-21 ENCOUNTER — Ambulatory Visit (HOSPITAL_COMMUNITY)
Admission: RE | Admit: 2021-06-21 | Discharge: 2021-06-21 | Disposition: A | Discharge status: Home / Self Care | Payer: BC Managed Care – PPO | Source: Ambulatory Visit | Attending: Internal Medicine | Admitting: Internal Medicine

## 2021-06-21 ENCOUNTER — Encounter (HOSPITAL_COMMUNITY): Payer: Self-pay | Admitting: Internal Medicine

## 2021-06-21 DIAGNOSIS — Z7722 Contact with and (suspected) exposure to environmental tobacco smoke (acute) (chronic): Secondary | ICD-10-CM | POA: Insufficient documentation

## 2021-06-21 DIAGNOSIS — I110 Hypertensive heart disease with heart failure: Secondary | ICD-10-CM | POA: Diagnosis not present

## 2021-06-21 DIAGNOSIS — I252 Old myocardial infarction: Secondary | ICD-10-CM | POA: Insufficient documentation

## 2021-06-21 DIAGNOSIS — Z87891 Personal history of nicotine dependence: Secondary | ICD-10-CM | POA: Insufficient documentation

## 2021-06-21 DIAGNOSIS — E785 Hyperlipidemia, unspecified: Secondary | ICD-10-CM | POA: Diagnosis not present

## 2021-06-21 DIAGNOSIS — I5022 Chronic systolic (congestive) heart failure: Secondary | ICD-10-CM | POA: Diagnosis not present

## 2021-06-21 DIAGNOSIS — I428 Other cardiomyopathies: Secondary | ICD-10-CM | POA: Diagnosis not present

## 2021-06-21 DIAGNOSIS — I2510 Atherosclerotic heart disease of native coronary artery without angina pectoris: Secondary | ICD-10-CM | POA: Diagnosis not present

## 2021-06-21 DIAGNOSIS — G4733 Obstructive sleep apnea (adult) (pediatric): Secondary | ICD-10-CM | POA: Diagnosis not present

## 2021-06-21 HISTORY — PX: RIGHT/LEFT HEART CATH AND CORONARY ANGIOGRAPHY: CATH118266

## 2021-06-21 LAB — POCT I-STAT EG7
Acid-Base Excess: 0 mmol/L (ref 0.0–2.0)
Bicarbonate: 26.4 mmol/L (ref 20.0–28.0)
Calcium, Ion: 1.24 mmol/L (ref 1.15–1.40)
HCT: 44 % (ref 39.0–52.0)
Hemoglobin: 15 g/dL (ref 13.0–17.0)
O2 Saturation: 77 %
Potassium: 4.3 mmol/L (ref 3.5–5.1)
Sodium: 141 mmol/L (ref 135–145)
TCO2: 28 mmol/L (ref 22–32)
pCO2, Ven: 47.9 mmHg (ref 44–60)
pH, Ven: 7.349 (ref 7.25–7.43)
pO2, Ven: 44 mmHg (ref 32–45)

## 2021-06-21 SURGERY — RIGHT/LEFT HEART CATH AND CORONARY ANGIOGRAPHY
Anesthesia: LOCAL

## 2021-06-21 MED ORDER — LIDOCAINE HCL (PF) 1 % IJ SOLN
INTRAMUSCULAR | Status: DC | PRN
  Administered 2021-06-21 (×2): 2 mL via SUBCUTANEOUS

## 2021-06-21 MED ORDER — IOHEXOL 350 MG/ML SOLN
INTRAVENOUS | Status: DC | PRN
  Administered 2021-06-21: 45 mL

## 2021-06-21 MED ORDER — HEPARIN SODIUM (PORCINE) 1000 UNIT/ML IJ SOLN
INTRAMUSCULAR | Status: DC | PRN
  Administered 2021-06-21: 5000 [IU] via INTRAVENOUS

## 2021-06-21 MED ORDER — SODIUM CHLORIDE 0.9 % IV SOLN: INTRAVENOUS | Status: DC

## 2021-06-21 MED ORDER — SODIUM CHLORIDE 0.9% FLUSH: 3.0000 mL | Freq: Two times a day (BID) | INTRAVENOUS | Status: DC

## 2021-06-21 MED ORDER — FENTANYL CITRATE (PF) 100 MCG/2ML IJ SOLN
INTRAMUSCULAR | Status: AC
  Filled 2021-06-21: qty 2

## 2021-06-21 MED ORDER — HEPARIN SODIUM (PORCINE) 1000 UNIT/ML IJ SOLN
INTRAMUSCULAR | Status: AC
  Filled 2021-06-21: qty 10

## 2021-06-21 MED ORDER — SODIUM CHLORIDE 0.9% FLUSH: 3.0000 mL | INTRAVENOUS | Status: DC | PRN

## 2021-06-21 MED ORDER — ONDANSETRON HCL 4 MG/2ML IJ SOLN: 4.0000 mg | Freq: Four times a day (QID) | INTRAMUSCULAR | Status: DC | PRN

## 2021-06-21 MED ORDER — LABETALOL HCL 5 MG/ML IV SOLN: 10.0000 mg | INTRAVENOUS | Status: DC | PRN

## 2021-06-21 MED ORDER — ASPIRIN 81 MG PO CHEW: 81.0000 mg | CHEWABLE_TABLET | ORAL | Status: DC

## 2021-06-21 MED ORDER — HEPARIN (PORCINE) IN NACL 1000-0.9 UT/500ML-% IV SOLN
INTRAVENOUS | Status: AC
  Filled 2021-06-21: qty 1000

## 2021-06-21 MED ORDER — MIDAZOLAM HCL 2 MG/2ML IJ SOLN
INTRAMUSCULAR | Status: AC
  Filled 2021-06-21: qty 2

## 2021-06-21 MED ORDER — HEPARIN (PORCINE) IN NACL 1000-0.9 UT/500ML-% IV SOLN
INTRAVENOUS | Status: DC | PRN
  Administered 2021-06-21 (×2): 500 mL

## 2021-06-21 MED ORDER — SODIUM CHLORIDE 0.9 % IV SOLN: 250.0000 mL | INTRAVENOUS | Status: DC | PRN

## 2021-06-21 MED ORDER — ACETAMINOPHEN 325 MG PO TABS: 650.0000 mg | ORAL_TABLET | ORAL | Status: DC | PRN

## 2021-06-21 MED ORDER — FENTANYL CITRATE (PF) 100 MCG/2ML IJ SOLN
INTRAMUSCULAR | Status: DC | PRN
  Administered 2021-06-21: 25 ug via INTRAVENOUS

## 2021-06-21 MED ORDER — VERAPAMIL HCL 2.5 MG/ML IV SOLN
INTRAVENOUS | Status: DC | PRN
  Administered 2021-06-21: 10 mL via INTRA_ARTERIAL

## 2021-06-21 MED ORDER — HYDRALAZINE HCL 20 MG/ML IJ SOLN: 10.0000 mg | INTRAMUSCULAR | Status: DC | PRN

## 2021-06-21 MED ORDER — MIDAZOLAM HCL 2 MG/2ML IJ SOLN
INTRAMUSCULAR | Status: DC | PRN
  Administered 2021-06-21: 1 mg via INTRAVENOUS

## 2021-06-21 MED ORDER — VERAPAMIL HCL 2.5 MG/ML IV SOLN
INTRAVENOUS | Status: AC
  Filled 2021-06-21: qty 2

## 2021-06-21 MED ORDER — LIDOCAINE HCL (PF) 1 % IJ SOLN
INTRAMUSCULAR | Status: AC
  Filled 2021-06-21: qty 30

## 2021-06-21 SURGICAL SUPPLY — 14 items
BAND ZEPHYR COMPRESS 30 LONG (HEMOSTASIS) ×1 IMPLANT
CATH BALLN WEDGE 5F 110CM (CATHETERS) ×1 IMPLANT
CATH DIAG 6FR JR4 (CATHETERS) ×1 IMPLANT
CATH INFINITI 6F FL3.5 (CATHETERS) ×1 IMPLANT
GLIDESHEATH SLEND SS 6F .021 (SHEATH) ×1 IMPLANT
GUIDEWIRE .025 260CM (WIRE) ×1 IMPLANT
GUIDEWIRE INQWIRE 1.5J.035X260 (WIRE) IMPLANT
INQWIRE 1.5J .035X260CM (WIRE) ×4
KIT HEART LEFT (KITS) ×2 IMPLANT
PACK CARDIAC CATHETERIZATION (CUSTOM PROCEDURE TRAY) ×2 IMPLANT
SHEATH GLIDE SLENDER 4/5FR (SHEATH) ×1 IMPLANT
SYR MEDRAD MARK 7 150ML (SYRINGE) ×2 IMPLANT
TRANSDUCER W/STOPCOCK (MISCELLANEOUS) ×2 IMPLANT
TUBING CIL FLEX 10 FLL-RA (TUBING) ×2 IMPLANT

## 2021-06-21 NOTE — Interval H&P Note (Signed)
History and Physical Interval Note:  06/21/2021 11:11 AM  Benjamin [name]  has presented today for surgery, with the diagnosis of congestive heart failure.  The various methods of treatment have been discussed with the patient and family. After consideration of risks, benefits and other options for treatment, the patient has consented to  Procedure(s): RIGHT/LEFT HEART CATH AND CORONARY ANGIOGRAPHY (N/A) as a surgical intervention.  The patient's history has been reviewed, patient examined, no change in status, stable for surgery.  I have reviewed the patient's chart and labs.  Questions were answered to the patient's satisfaction.     Early Osmond

## 2021-06-21 NOTE — Discharge Instructions (Signed)

## 2021-06-21 NOTE — Progress Notes (Signed)
Heart cath did not show any significant blockages. Can we please get an update on his entersto approval. At recent visit we were going to retry applying given that he EF has dropped again.    Zandra Abts MD

## 2021-06-22 LAB — POCT I-STAT 7, (LYTES, BLD GAS, ICA,H+H)
Acid-base deficit: 1 mmol/L (ref 0.0–2.0)
Bicarbonate: 24.7 mmol/L (ref 20.0–28.0)
Calcium, Ion: 1.23 mmol/L (ref 1.15–1.40)
HCT: 43 % (ref 39.0–52.0)
Hemoglobin: 14.6 g/dL (ref 13.0–17.0)
O2 Saturation: 95 %
Potassium: 4.2 mmol/L (ref 3.5–5.1)
Sodium: 140 mmol/L (ref 135–145)
TCO2: 26 mmol/L (ref 22–32)
pCO2 arterial: 44.6 mmHg (ref 32–48)
pH, Arterial: 7.35 (ref 7.35–7.45)
pO2, Arterial: 83 mmHg (ref 83–108)

## 2021-06-22 LAB — POCT I-STAT EG7
Acid-Base Excess: 0 mmol/L (ref 0.0–2.0)
Bicarbonate: 25.8 mmol/L (ref 20.0–28.0)
Calcium, Ion: 1.2 mmol/L (ref 1.15–1.40)
HCT: 42 % (ref 39.0–52.0)
Hemoglobin: 14.3 g/dL (ref 13.0–17.0)
O2 Saturation: 74 %
Potassium: 4.1 mmol/L (ref 3.5–5.1)
Sodium: 141 mmol/L (ref 135–145)
TCO2: 27 mmol/L (ref 22–32)
pCO2, Ven: 47.5 mmHg (ref 44–60)
pH, Ven: 7.343 (ref 7.25–7.43)
pO2, Ven: 42 mmHg (ref 32–45)

## 2021-06-26 ENCOUNTER — Encounter (HOSPITAL_BASED_OUTPATIENT_CLINIC_OR_DEPARTMENT_OTHER): Payer: Self-pay | Admitting: *Deleted

## 2021-06-26 ENCOUNTER — Telehealth: Payer: Medicare Other

## 2021-06-26 ENCOUNTER — Emergency Department (HOSPITAL_BASED_OUTPATIENT_CLINIC_OR_DEPARTMENT_OTHER)
Admission: EM | Admit: 2021-06-26 | Discharge: 2021-06-27 | Disposition: A | Discharge status: Home / Self Care | Payer: BC Managed Care – PPO | Attending: Emergency Medicine | Admitting: Emergency Medicine

## 2021-06-26 ENCOUNTER — Other Ambulatory Visit: Payer: Self-pay

## 2021-06-26 DIAGNOSIS — Z7982 Long term (current) use of aspirin: Secondary | ICD-10-CM | POA: Insufficient documentation

## 2021-06-26 DIAGNOSIS — I10 Essential (primary) hypertension: Secondary | ICD-10-CM | POA: Diagnosis not present

## 2021-06-26 DIAGNOSIS — Z79899 Other long term (current) drug therapy: Secondary | ICD-10-CM | POA: Diagnosis not present

## 2021-06-26 DIAGNOSIS — K8020 Calculus of gallbladder without cholecystitis without obstruction: Secondary | ICD-10-CM | POA: Insufficient documentation

## 2021-06-26 DIAGNOSIS — I2510 Atherosclerotic heart disease of native coronary artery without angina pectoris: Secondary | ICD-10-CM | POA: Insufficient documentation

## 2021-06-26 DIAGNOSIS — R1013 Epigastric pain: Secondary | ICD-10-CM

## 2021-06-26 DIAGNOSIS — R001 Bradycardia, unspecified: Secondary | ICD-10-CM | POA: Diagnosis not present

## 2021-06-26 DIAGNOSIS — N400 Enlarged prostate without lower urinary tract symptoms: Secondary | ICD-10-CM | POA: Diagnosis not present

## 2021-06-26 DIAGNOSIS — K429 Umbilical hernia without obstruction or gangrene: Secondary | ICD-10-CM | POA: Diagnosis not present

## 2021-06-26 LAB — COMPREHENSIVE METABOLIC PANEL
ALT: 20 U/L (ref 0–44)
AST: 18 U/L (ref 15–41)
Albumin: 4.1 g/dL (ref 3.5–5.0)
Alkaline Phosphatase: 90 U/L (ref 38–126)
Anion gap: 10 (ref 5–15)
BUN: 16 mg/dL (ref 8–23)
CO2: 24 mmol/L (ref 22–32)
Calcium: 9.8 mg/dL (ref 8.9–10.3)
Chloride: 104 mmol/L (ref 98–111)
Creatinine, Ser: 1.15 mg/dL (ref 0.61–1.24)
GFR, Estimated: >60 mL/min (ref >60)
Glucose, Bld: 183 mg/dL — ABNORMAL HIGH (ref 70–99)
Potassium: 3.8 mmol/L (ref 3.5–5.1)
Sodium: 138 mmol/L (ref 135–145)
Total Bilirubin: 0.7 mg/dL (ref 0.3–1.2)
Total Protein: 6.7 g/dL (ref 6.5–8.1)

## 2021-06-26 LAB — CBC
HCT: 44.9 % (ref 39.0–52.0)
Hemoglobin: 14.6 g/dL (ref 13.0–17.0)
MCH: 27.4 pg (ref 26.0–34.0)
MCHC: 32.5 g/dL (ref 30.0–36.0)
MCV: 84.2 fL (ref 80.0–100.0)
Platelets: 218 10*3/uL (ref 150–400)
RBC: 5.33 MIL/uL (ref 4.22–5.81)
RDW: 13.9 % (ref 11.5–15.5)
WBC: 10.3 10*3/uL (ref 4.0–10.5)
nRBC: 0 % (ref 0.0–0.2)

## 2021-06-26 LAB — URINALYSIS, ROUTINE W REFLEX MICROSCOPIC
Bilirubin Urine: NEGATIVE
Glucose, UA: NEGATIVE mg/dL
Hgb urine dipstick: NEGATIVE
Ketones, ur: NEGATIVE mg/dL
Leukocytes,Ua: NEGATIVE
Nitrite: NEGATIVE
Specific Gravity, Urine: 1.028 (ref 1.005–1.030)
pH: 5 (ref 5.0–8.0)

## 2021-06-26 LAB — LIPASE, BLOOD: Lipase: 40 U/L (ref 11–51)

## 2021-06-26 NOTE — ED Triage Notes (Signed)
Pt is here for abdominal pain and lower back pain which first appeared yesterday.  Pt reports upper abdominal pain in RUQ and LUQ.  No N/v/d with this, no fever.  Hx of gallstones

## 2021-06-27 ENCOUNTER — Emergency Department (HOSPITAL_BASED_OUTPATIENT_CLINIC_OR_DEPARTMENT_OTHER): Payer: BC Managed Care – PPO

## 2021-06-27 DIAGNOSIS — N400 Enlarged prostate without lower urinary tract symptoms: Secondary | ICD-10-CM | POA: Diagnosis not present

## 2021-06-27 DIAGNOSIS — K429 Umbilical hernia without obstruction or gangrene: Secondary | ICD-10-CM | POA: Diagnosis not present

## 2021-06-27 DIAGNOSIS — K8020 Calculus of gallbladder without cholecystitis without obstruction: Secondary | ICD-10-CM | POA: Diagnosis not present

## 2021-06-27 MED ORDER — ONDANSETRON HCL 4 MG/2ML IJ SOLN
4.0000 mg | Freq: Once | INTRAMUSCULAR | Status: AC
  Administered 2021-06-27: 4 mg via INTRAVENOUS
  Filled 2021-06-27: qty 2

## 2021-06-27 MED ORDER — MORPHINE SULFATE (PF) 4 MG/ML IV SOLN
4.0000 mg | Freq: Once | INTRAVENOUS | Status: AC
  Administered 2021-06-27: 4 mg via INTRAVENOUS
  Filled 2021-06-27: qty 1

## 2021-06-27 MED ORDER — HYDROCODONE-ACETAMINOPHEN 5-325 MG PO TABS
1.0000 | ORAL_TABLET | Freq: Four times a day (QID) | ORAL | 0 refills | Status: DC | PRN
Start: 2021-06-27 — End: 2022-04-09

## 2021-06-27 MED ORDER — IOHEXOL 300 MG/ML  SOLN
100.0000 mL | Freq: Once | INTRAMUSCULAR | Status: AC | PRN
  Administered 2021-06-27: 100 mL via INTRAVENOUS

## 2021-06-27 NOTE — ED Notes (Signed)
Patient transported to CT 

## 2021-07-12 ENCOUNTER — Other Ambulatory Visit: Payer: Self-pay

## 2021-07-12 NOTE — Telephone Encounter (Signed)
After multiple attempts with [employer] and [employer] to track the appeal process with no help, I contacted the pharmacy provider courtesy review line and left a message for a call back regarding pt's medication appeal.

## 2021-07-12 NOTE — Telephone Encounter (Signed)
Pharmacy faxed a refill request for pregabalin (LYRICA) 75 MG capsule [[id number]]    Order Details Dose: 75 mg Route: Oral Frequency: 2 times daily  Dispense Quantity: 60 capsule Refills: 3        Sig: Take 1 capsule (75 mg total) by mouth 2 (two) times daily. For neuropathy    LOV: 03/17/21

## 2021-07-12 NOTE — Telephone Encounter (Signed)
Requested medication (s) are due for refill today - yes  Requested medication (s) are on the active medication list -yes  Future visit scheduled -no  Last refill: 03/17/21 #60 3RF  Notes to clinic: non delegated Rx  Requested Prescriptions  Pending Prescriptions Disp Refills   pregabalin (LYRICA) 75 MG capsule 60 capsule 3    Sig: Take 1 capsule (75 mg total) by mouth 2 (two) times daily. For neuropathy     Not Delegated - Neurology:  Anticonvulsants - Controlled - pregabalin Failed - 07/12/2021 11:07 AM      Failed - This refill cannot be delegated      Passed - Cr in normal range and within 360 days    Creat  Date Value Ref Range Status  03/17/2021 1.14 0.70 - 1.28 mg/dL Final   Creatinine, Ser  Date Value Ref Range Status  06/26/2021 1.15 0.61 - 1.24 mg/dL Final         Passed - Completed PHQ-2 or PHQ-9 in the last 360 days      Passed - Valid encounter within last 12 months    Recent Outpatient Visits           3 months ago Prediabetes   [place] [facility] Scha[facility], Cammie Mcgee, MD   8 months ago Upper respiratory tract infection, unspecified type   [facility] Eulogio Bear, NP   1 year ago CAD in native artery   [place] Pickard, Cammie Mcgee, MD   1 year ago Idiopathic peripheral neuropathy   [place] Eulogio Bear, NP   2 years ago CAD in native artery   [place] Pickard, Cammie Mcgee, MD       Future Appointments             In 1 month Dunn, Nedra Hai, PA-C [facility], [place] [facility]               Requested Prescriptions  Pending Prescriptions Disp Refills   pregabalin (LYRICA) 75 MG capsule 60 capsule 3    Sig: Take 1 capsule (75 mg total) by mouth 2 (two) times daily. For neuropathy     Not Delegated - Neurology:  Anticonvulsants - Controlled - pregabalin Failed - 07/12/2021 11:07 AM      Failed - This refill cannot be delegated      Passed -  Cr in normal range and within 360 days    Creat  Date Value Ref Range Status  03/17/2021 1.14 0.70 - 1.28 mg/dL Final   Creatinine, Ser  Date Value Ref Range Status  06/26/2021 1.15 0.61 - 1.24 mg/dL Final         Passed - Completed PHQ-2 or PHQ-9 in the last 360 days      Passed - Valid encounter within last 12 months    Recent Outpatient Visits           3 months ago Prediabetes   [place] [facility] Schaumann, Cammie Mcgee, MD   8 months ago Upper respiratory tract infection, unspecified type   [facility] Eulogio Bear, NP   1 year ago CAD in native artery   [place] Pickard, Cammie Mcgee, MD   1 year ago Idiopathic peripheral neuropathy   [place] Eulogio Bear, NP   2 years ago CAD in native artery   [place], Cammie Mcgee, MD  Future Appointments             In 1 month Dunn, Dayna N, PA-C [place], [place] [facility]

## 2021-07-14 MED ORDER — PREGABALIN 75 MG PO CAPS: 75.0000 mg | ORAL_CAPSULE | Freq: Two times a day (BID) | ORAL | 3 refills | Status: DC

## 2021-07-17 ENCOUNTER — Telehealth: Payer: Self-pay | Admitting: Cardiology

## 2021-07-17 NOTE — Telephone Encounter (Signed)
   Pre-operative Risk Assessment    Patient Name: Benjamin [name]  DOB: 07-18-1950 MRN: [id number]      Request for Surgical Clearance    Procedure:   Colonscopy   Date of Surgery:  Clearance 07/25/21                                 Surgeon:  Dr. Irine Seal Group or Practice Name:  [facility]  Phone number:  [phone] Fax number:  [phone]   Type of Clearance Requested:   - Medical    Type of Anesthesia:   propofol    Additional requests/questions:    Sandrea Hammond   07/17/2021, 5:18 PM

## 2021-07-18 ENCOUNTER — Ambulatory Visit (INDEPENDENT_AMBULATORY_CARE_PROVIDER_SITE_OTHER): Payer: BC Managed Care – PPO | Admitting: Physician Assistant

## 2021-07-18 ENCOUNTER — Telehealth: Payer: Self-pay | Admitting: *Deleted

## 2021-07-18 DIAGNOSIS — Z01810 Encounter for preprocedural cardiovascular examination: Secondary | ICD-10-CM

## 2021-07-18 NOTE — Telephone Encounter (Signed)
Left message for the pt to call back today so that we may set up a tele visit today at 1:50 per Vin Bhagat, PAC. Pt procedure is 07/25/21. Pt under went recent heart cath and will need a tele visit. I will update the requesting office the pt will need a tele visit.

## 2021-07-18 NOTE — Telephone Encounter (Signed)
  Patient Consent for Virtual Visit        Benjamin [name] has provided verbal consent on 07/18/2021 for a virtual visit (video or telephone).   CONSENT FOR VIRTUAL VISIT FOR:  Benjamin [name]  By participating in this virtual visit I agree to the following:  I hereby voluntarily request, consent and authorize [facility] and its employed or contracted physicians, physician assistants, nurse practitioners or other licensed health care professionals (the Practitioner), to provide me with telemedicine health care services (the "Services") as deemed necessary by the treating Practitioner. I acknowledge and consent to receive the Services by the Practitioner via telemedicine. I understand that the telemedicine visit will involve communicating with the Practitioner through live audiovisual communication technology and the disclosure of certain medical information by electronic transmission. I acknowledge that I have been given the opportunity to request an in-person assessment or other available alternative prior to the telemedicine visit and am voluntarily participating in the telemedicine visit.  I understand that I have the right to withhold or withdraw my consent to the use of telemedicine in the course of my care at any time, without affecting my right to future care or treatment, and that the Practitioner or I may terminate the telemedicine visit at any time. I understand that I have the right to inspect all information obtained and/or recorded in the course of the telemedicine visit and may receive copies of available information for a reasonable fee.  I understand that some of the potential risks of receiving the Services via telemedicine include:  Delay or interruption in medical evaluation due to technological equipment failure or disruption; Information transmitted may not be sufficient (e.g. poor resolution of images) to allow for appropriate medical decision making by the Practitioner; and/or   In rare instances, security protocols could fail, causing a breach of personal health information.  Furthermore, I acknowledge that it is my responsibility to provide information about my medical history, conditions and care that is complete and accurate to the best of my ability. I acknowledge that Practitioner's advice, recommendations, and/or decision may be based on factors not within their control, such as incomplete or inaccurate data provided by me or distortions of diagnostic images or specimens that may result from electronic transmissions. I understand that the practice of medicine is not an exact science and that Practitioner makes no warranties or guarantees regarding treatment outcomes. I acknowledge that a copy of this consent can be made available to me via my patient portal ([facility]), or I can request a printed copy by calling the office of [facility].    I understand that my insurance will be billed for this visit.   I have read or had this consent read to me. I understand the contents of this consent, which adequately explains the benefits and risks of the Services being provided via telemedicine.  I have been provided ample opportunity to ask questions regarding this consent and the Services and have had my questions answered to my satisfaction. I give my informed consent for the services to be provided through the use of telemedicine in my medical care

## 2021-07-18 NOTE — Telephone Encounter (Signed)
Patient is returning call to schedule.

## 2021-07-18 NOTE — Progress Notes (Signed)
Virtual Visit via Telephone Note   Because of Benjamin [name]'s co-morbid illnesses, he is at least at moderate risk for complications without adequate follow up.  This format is felt to be most appropriate for this patient at this time.  The patient did not have access to video technology/had technical difficulties with video requiring transitioning to audio format only (telephone).  All issues noted in this document were discussed and addressed.  No physical exam could be performed with this format.  Please refer to the patient's chart for his consent to telehealth for [facility].  Evaluation Performed:  Preoperative cardiovascular risk assessment _____________   Date:  07/18/2021   Patient ID:  Benjamin [name], DOB 1950/09/01, MRN [id number] Patient Location:  Home Provider location:   Office  Primary Care Provider:  Susy Frizzle, MD Primary Cardiologist:  Carlyle Dolly, MD  Chief Complaint / Patient Profile   71 y.o. y/o male with a h/o CAD, hypertension, hyperlipidemia and nonischemic cardiomyopathy who is pending colonoscopy and presents today for telephonic preoperative cardiovascular risk assessment.  Past Medical History    Past Medical History:  Diagnosis Date   Allergy    ASCVD (arteriosclerotic cardiovascular disease)    Diabetes mellitus without complication ([place])    prediabetes   Hyperlipidemia    Hypertension    Myocardial infarction ([place]) 04/20/1999   Obesity (BMI 30-39.9)    OSA (obstructive sleep apnea) 11/02/2015   Past Surgical History:  Procedure Laterality Date   CARDIAC CATHETERIZATION N/A 05/19/2015   Procedure: Right/Left Heart Cath and Coronary Angiography;  Surgeon: Belva Crome, MD;  Location: [facility];  Service: Cardiovascular;  Laterality: N/A;   CORONARY STENT PLACEMENT  04/18/1999   RIGHT/LEFT HEART CATH AND CORONARY ANGIOGRAPHY N/A 06/21/2021   Procedure: RIGHT/LEFT HEART CATH AND CORONARY ANGIOGRAPHY;  Surgeon: Early Osmond, MD;  Location: [facility];  Service: Cardiovascular;  Laterality: N/A;    Allergies  Allergies  Allergen Reactions   Penicillins Swelling    History of Present Illness    Benjamin [name] is a 71 y.o. male who presents via audio/video conferencing for a telehealth visit today.  Pt was last seen in cardiology clinic on June 14, 2021 by Dr. Harl Bowie.  At that time Jevan Gaunt was doing well.  He underwent cardiac catheterization due to drop in LV function showing patent coronaries. The patient is now pending procedure as outlined above. Since his last visit, he done well. The patient denies nausea, vomiting, fever, chest pain, palpitations, shortness of breath, orthopnea, PND, dizziness, syncope, cough, congestion, abdominal pain, hematochezia, melena, lower extremity edema.    Home Medications    Prior to Admission medications   Medication Sig Start Date End Date Taking? Authorizing Provider  acetaminophen (TYLENOL) 500 MG tablet Take 1,000 mg by mouth every 6 (six) hours as needed for moderate pain.    [provider]  aspirin 81 MG tablet Take 81 mg by mouth daily. Reported on 05/05/2015    [provider]  atorvastatin (LIPITOR) 80 MG tablet TAKE ONE TABLET BY MOUTH EVERY EVENING AT 6PM 12/19/20   Susy Frizzle, MD  carvedilol (COREG) 3.125 MG tablet TAKE ONE TABLET BY MOUTH TWICE A DAY 05/01/21   Arnoldo Lenis, MD  Cholecalciferol (VITAMIN D) 2000 UNITS CAPS Take 1 capsule by mouth daily.     [provider]  HYDROcodone-acetaminophen (NORCO) 5-325 MG tablet Take 1-2 tablets by mouth every 6 (six) hours as needed. 06/27/21  Veryl Speak, MD  ibuprofen (ADVIL) 200 MG tablet Take 400 mg by mouth every 6 (six) hours as needed for moderate pain.    [provider]  losartan (COZAAR) 50 MG tablet Take 1 tablet (50 mg total) by mouth daily. 03/27/21   Arnoldo Lenis, MD  pregabalin (LYRICA) 75 MG capsule Take 1 capsule (75 mg total)  by mouth 2 (two) times daily. For neuropathy 07/14/21   Susy Frizzle, MD  spironolactone (ALDACTONE) 25 MG tablet Take 0.5 tablets (12.5 mg total) by mouth daily. 06/14/21 12/11/21  Arnoldo Lenis, MD  vitamin B-12 (CYANOCOBALAMIN) 1000 MCG tablet Take 1,000 mcg by mouth daily.    [provider]    Physical Exam    Vital Signs:  Mukesh Kornegay does not have vital signs available for review today.  Given telephonic nature of communication, physical exam is limited. AAOx3. NAD. Normal affect.  Speech and respirations are unlabored.  Accessory Clinical Findings    None  Assessment & Plan    1.  Preoperative Cardiovascular Risk Assessment:  Given past medical history and time since last visit, based on ACC/AHA guidelines, Eulogio Requena would be at acceptable risk for the planned procedure without further cardiovascular testing.   The patient was advised that if he develops new symptoms prior to surgery to contact our office to arrange for a follow-up visit, and he verbalized understanding.  I will route this recommendation to the requesting party via Epic fax function and remove from pre-op pool.  Please call with questions.  Leanor Kail, PA  A copy of this note will be routed to requesting surgeon.  Time:   Today, I have spent 7 minutes with the patient with telehealth technology discussing medical history, symptoms, and management plan.     [place], [place]  07/18/2021, 1:22 PM

## 2021-07-18 NOTE — Telephone Encounter (Signed)
Recently underwent cath for NICM.     Name: Benjamin [name]  DOB: 1950-01-25  MRN: [id number]  Primary Cardiologist: Carlyle Dolly, MD   Preoperative team, please contact this patient and set up a phone call appointment for further preoperative risk assessment. Please obtain consent and complete medication review. Thank you for your help.  I confirm that guidance regarding antiplatelet and oral anticoagulation therapy has been completed and, if necessary, noted below.    Leanor Kail, [place] 07/18/2021, 9:46 AM [place] [address] [place], [place] [address]

## 2021-07-18 NOTE — Telephone Encounter (Signed)
S/w the pt and he is agreeable to tele appt today. Pt asked to be now if he could as he has an appt at 2 pm. Med rec and consent are done.

## 2021-08-21 NOTE — Progress Notes (Addendum)
Cardiology Office Note    Date:  08/24/2021   ID:  Benjamin [name], DOB June 12, 1950, MRN [id number]  PCP:  Susy Frizzle, MD  Cardiologist:  Carlyle Dolly, MD  Electrophysiologist:  None   Chief Complaint: f/u NICM  History of Present Illness:   Benjamin [name] is a 71 y.o. male with history of CAD, NICM/chronic HFrEF, HTN, HLD, DM, obesity, OSA who is seen for follow-up. Per notes, patient had remote MI 2001 with stent to LAD. In 2017 he developed a drop in LVEF to 25-30% with patent coronaries by cath at that time, LV dysfunction felt to reflect NICM. Insurance has previously turned down [employer] despite appeals so has been on ARB. EF improved to 45-50% in 09/2015, but declined again by limited echo in 05/2021 to 25-30%. Echo otherwise showed slow flow and loosely organized thrombotic material at LV apex, although no clearly formed mural thrombus. This prompted repeat cath showing nonobstructive CAD with patent proximal LAD stent with mild in-stent restenosis and mild disease elsewhere. He had relatively normal filling pressures with a mean RA of 2 mmHg, mean wedge of 9 mmHg, mean PA of 19 mmHg, with normal pulmonary artery pulsatility index. At last OV 06/2021, spironolactone was added with plan to consider Wilder Glade once Entresto sorted out.  He returns for follow-up today doing well. He shares the good news that his insurance company has approved Entresto and he's taking the 24/'26mg'$  BID dose (made the switch from losartan about 3 weeks ago). He does report a not-new history of infrequent orthostasis upon standing though not lifestyle limiting, no pre-syncope or syncope. He reports overall he feels great. 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 No claudication with ambulation. He does report a history of peripheral neuropathy of unclear etiology. No spinal stenosis or carpal tunnel. No family hx of cardiomyopathies.  He is  an [place].  Labwork independently reviewed: 06/2021 CBC wnl, K 3.6, Cr 1.15, LFTs ok 03/2021 TSH wnl, LDL 62, trig 99, A1C 6.6.   Cardiology Studies:   Studies reviewed are outlined and summarized above. Reports included below if pertinent.   Cath 06/2021     Dist Cx lesion is 50% stenosed.   Prox LAD lesion is 20% stenosed.   Mid LAD lesion is 20% stenosed.   LV end diastolic pressure is normal.   1.  Patent proximal LAD stent with mild in-stent restenosis and mild disease elsewhere. 2.  LVEDP of 18 mmHg. 3.  Cardiac output of 8.46 L/min and cardiac index of 3.6 L/min/m by Fick. 4.  Relatively normal filling pressures with a mean RA of 2 mmHg, mean wedge of 9 mmHg, mean PA of 19 mmHg, with normal pulmonary artery pulsatility index.   Recommendation: Goal-directed medical therapy for nonischemic cardiomyopathy.  Echo 05/2021    1. Limited study.   2. Left ventricular ejection fraction, by estimation, is 25 to 30%. The  left ventricle has severely decreased function. The left ventricle  demonstrates regional wall motion abnormalities (see scoring  diagram/findings for description). The left  ventricular internal cavity size was mildly dilated.   3. Definity contrast shows slow flow and loosely organized thrombotic  material at LV apex, although no clearly formed mural thrombus.   4. Right ventricular systolic function is normal. The right ventricular  size is normal.   Comparison(s): Prior images reviewed side by side. LVEF decreased relative  to prior  study.     Past Medical History:  Diagnosis Date   Allergy    ASCVD (arteriosclerotic cardiovascular disease)    Diabetes mellitus without complication ([place])    prediabetes   Hyperlipidemia    Hypertension    Myocardial infarction ([place]) 04/20/1999   Obesity (BMI 30-39.9)    OSA (obstructive sleep apnea) 11/02/2015    Past Surgical History:  Procedure Laterality Date   CARDIAC CATHETERIZATION N/A 05/19/2015    Procedure: Right/Left Heart Cath and Coronary Angiography;  Surgeon: Belva Crome, MD;  Location: [facility];  Service: Cardiovascular;  Laterality: N/A;   CORONARY STENT PLACEMENT  04/18/1999   RIGHT/LEFT HEART CATH AND CORONARY ANGIOGRAPHY N/A 06/21/2021   Procedure: RIGHT/LEFT HEART CATH AND CORONARY ANGIOGRAPHY;  Surgeon: Early Osmond, MD;  Location: [facility];  Service: Cardiovascular;  Laterality: N/A;    Current Medications: Current Meds  Medication Sig   acetaminophen (TYLENOL) 500 MG tablet Take 1,000 mg by mouth every 6 (six) hours as needed for moderate pain.   aspirin 81 MG tablet Take 81 mg by mouth daily. Reported on 05/05/2015   atorvastatin (LIPITOR) 80 MG tablet TAKE ONE TABLET BY MOUTH EVERY EVENING AT 6PM   carvedilol (COREG) 3.125 MG tablet TAKE ONE TABLET BY MOUTH TWICE A DAY   Cholecalciferol (VITAMIN D) 2000 UNITS CAPS Take 1 capsule by mouth daily.    ibuprofen (ADVIL) 200 MG tablet Take 400 mg by mouth every 6 (six) hours as needed for moderate pain.   pregabalin (LYRICA) 75 MG capsule Take 1 capsule (75 mg total) by mouth 2 (two) times daily. For neuropathy   spironolactone (ALDACTONE) 25 MG tablet Take 0.5 tablets (12.5 mg total) by mouth daily.   vitamin B-12 (CYANOCOBALAMIN) 1000 MCG tablet Take 1,000 mcg by mouth daily.     Allergies:   Penicillins   Social History   Socioeconomic History   Marital status: Married    Spouse name: Arville Go   Number of children: 2   Years of education: Not on file   Highest education level: Not on file  Occupational History   Not on file  Tobacco Use   Smoking status: Former    Packs/day: 0.50    Years: 8.00    Total pack years: 4.00    Types: Cigarettes    Start date: 07/24/1966    Quit date: 07/24/1974    Years since quitting: 47.1    Passive exposure: Past   Smokeless tobacco: Never  Vaping Use   Vaping Use: Never used  Substance and Sexual Activity   Alcohol use: No    Alcohol/week: 0.0  standard drinks of alcohol   Drug use: No   Sexual activity: Yes    Birth control/protection: None  Other Topics Concern   Not on file  Social History Narrative   Pt currently works at [employer].    Social Determinants of Health   Financial Resource Strain: Low Risk  (03/09/2021)   Overall Financial Resource Strain (CARDIA)    Difficulty of Paying Living Expenses: Not hard at all  Food Insecurity: No Food Insecurity (03/09/2021)   Hunger Vital Sign    Worried About Running Out of Food in the Last Year: Never true    Ran Out of Food in the Last Year: Never true  Transportation Needs: No Transportation Needs (03/09/2021)   PRAPARE - Hydrologist (Medical): No    Lack of Transportation (Non-Medical): No  Physical  Activity: Inactive (03/09/2021)   Exercise Vital Sign    Days of Exercise per Week: 0 days    Minutes of Exercise per Session: 0 min  Stress: No Stress Concern Present (03/09/2021)   [place]    Feeling of Stress : Not at all  Social Connections: Moderately Isolated (03/09/2021)   Social Connection and Isolation Panel [NHANES]    Frequency of Communication with Friends and Family: More than three times a week    Frequency of Social Gatherings with Friends and Family: More than three times a week    Attends Religious Services: Never    Marine scientist or Organizations: No    Attends Archivist Meetings: Never    Marital Status: Married     Family History:  The patient's family history includes AAA (abdominal aortic aneurysm) in his father.  ROS:   Please see the history of present illness.  All other systems are reviewed and otherwise negative.    EKG(s)/Additional Labs   EKG:  EKG is not ordered today but reviewed prior tracing  Recent Labs: 03/17/2021: TSH 1.05 06/26/2021: ALT 20; BUN 16; Creatinine, Ser 1.15; Hemoglobin 14.6; Platelets 218; Potassium  3.8; Sodium 138  Recent Lipid Panel    Component Value Date/Time   CHOL 131 03/17/2021 0920   TRIG 99 03/17/2021 0920   HDL 51 03/17/2021 0920   CHOLHDL 2.6 03/17/2021 0920   VLDL 29 04/27/2016 0822   LDLCALC 62 03/17/2021 0920    PHYSICAL EXAM:    VS:  BP 116/66   Pulse 74   Ht '5\' 10"'$  (1.778 m)   Wt 273 lb 3.2 oz (123.9 kg)   SpO2 93%   BMI 39.20 kg/m   BMI: Body mass index is 39.2 kg/m. 93-96% similar to prior visits GEN: Well nourished, well developed male in no acute distress HEENT: normocephalic, atraumatic Neck: no JVD, carotid bruits, or masses Cardiac: RRR; no murmurs, rubs, or gallops, no edema, varicose veins noted Respiratory:  clear to auscultation bilaterally, normal work of breathing GI: soft, nontender, nondistended, + BS MS: no deformity or atrophy Skin: warm and dry, no rash Neuro:  Alert and Oriented x 3, Strength and sensation are intact, follows commands Psych: euthymic mood, full affect  Wt Readings from Last 3 Encounters:  08/24/21 273 lb 3.2 oz (123.9 kg)  06/26/21 265 lb (120.2 kg)  06/21/21 265 lb (120.2 kg)     ASSESSMENT & PLAN:   1. Chronic HFrEF/NICM - etiology of recurrent decline in LVEF is unclear, dropped to 25-30% by echo in 05/2021. Cath 06/2021 without progressive disease. I discussed echocardiogram findings with Dr. Harl Bowie today as well as the question of slow flow and loosely organized thrombotic material at the apex though no clearly formed mural thrombus. Per our discussion we will undertake cardiac MRI to evaluate further for infiltrative disease, and further characterize the thrombotic organization as well. He denies claustrophobia or metal in his body. The patient affirms he has been on Entresto 24/'26mg'$  BID in lieu of losartan for the last 3 weeks as it finally got approved. Continue current regimen today - we'll obtain a f/u BMET today to help guide next steps - he does report h/o some orthostatic dizziness when standing so may need  to be cautious with further med titration. Would be reasonable to trial SGLT2i in lieu of Entresto titration if labs OK and follow closely for tolerability. I am also a bit concerned  about his diagnosis of peripheral neuropathy - his A1C has been generally well controlled and he states it was not previously felt due to this. In addition to B12, folate levels, I discussed case with HF APP who suggested that myeloma panel, SPEP, UPEP would be helpful as well if there is suspicion of amyloid.   2. Essential HTN - follow BP in context above.  3. CAD - no recent angina. Reviewed cath results with patient today. Continue ASA, carvedilol, statin. Discussed limiting ibuprofen given ASA use.  4. Hyperlipidemia - continue atorvastatin. Lipids at goal by last check.  5. Peripheral neuropathy - will check B12 and folate levels in addition to labs above.     Disposition: F/u with Dr. Harl Bowie for APP in 4-6 weeks. I also see he is overdue for follow-up with Dr. Radford Pax for sleep apnea, last saw in 2021. Recommend getting back on track for follow-up.   Medication Adjustments/Labs and Tests Ordered: Current medicines are reviewed at length with the patient today.  Concerns regarding medicines are outlined above. Medication changes, Labs and Tests ordered today are summarized above and listed in the Patient Instructions accessible in Encounters.    Signed, Charlie Pitter, PA-C  08/24/2021 2:36 PM    [facility] in [place]. [place], [place] [address] Ph: [phone]; Fax [phone]

## 2021-08-24 ENCOUNTER — Encounter: Payer: Self-pay | Admitting: Physician Assistant

## 2021-08-24 ENCOUNTER — Ambulatory Visit (INDEPENDENT_AMBULATORY_CARE_PROVIDER_SITE_OTHER): Payer: BC Managed Care – PPO | Admitting: Physician Assistant

## 2021-08-24 ENCOUNTER — Other Ambulatory Visit (HOSPITAL_COMMUNITY)
Admission: RE | Admit: 2021-08-24 | Discharge: 2021-08-24 | Disposition: A | Discharge status: Home / Self Care | Payer: BC Managed Care – PPO | Source: Ambulatory Visit | Attending: Physician Assistant | Admitting: Physician Assistant

## 2021-08-24 VITALS — BP 116/66 | HR 74 | Ht 70.0 in | Wt 273.2 lb

## 2021-08-24 DIAGNOSIS — I5022 Chronic systolic (congestive) heart failure: Secondary | ICD-10-CM | POA: Diagnosis not present

## 2021-08-24 DIAGNOSIS — G609 Hereditary and idiopathic neuropathy, unspecified: Secondary | ICD-10-CM

## 2021-08-24 DIAGNOSIS — I10 Essential (primary) hypertension: Secondary | ICD-10-CM

## 2021-08-24 DIAGNOSIS — I428 Other cardiomyopathies: Secondary | ICD-10-CM

## 2021-08-24 DIAGNOSIS — E785 Hyperlipidemia, unspecified: Secondary | ICD-10-CM

## 2021-08-24 DIAGNOSIS — I2510 Atherosclerotic heart disease of native coronary artery without angina pectoris: Secondary | ICD-10-CM | POA: Diagnosis not present

## 2021-08-24 LAB — BASIC METABOLIC PANEL
Anion gap: 8 (ref 5–15)
BUN: 18 mg/dL (ref 8–23)
CO2: 22 mmol/L (ref 22–32)
Calcium: 8.9 mg/dL (ref 8.9–10.3)
Chloride: 108 mmol/L (ref 98–111)
Creatinine, Ser: 1.01 mg/dL (ref 0.61–1.24)
GFR, Estimated: >60 mL/min (ref >60)
Glucose, Bld: 115 mg/dL — ABNORMAL HIGH (ref 70–99)
Potassium: 3.6 mmol/L (ref 3.5–5.1)
Sodium: 138 mmol/L (ref 135–145)

## 2021-08-24 LAB — VITAMIN B12: Vitamin B-12: 275 pg/mL (ref 180–914)

## 2021-08-24 LAB — FOLATE: Folate: 7.7 ng/mL (ref >5.9)

## 2021-08-24 NOTE — Patient Instructions (Addendum)
Medication Instructions:  Your physician recommends that you continue on your current medications as directed. Please refer to the Current Medication list given to you today.   Labwork: Today: -B12/Foalte -BMET -Myeloma Panel -SPEP -UPEP  Testing/Procedures: Cardiac MRI  Follow-Up: Follow up with Dr. Christella Noa in 4-6 weeks Follow up with Dr. Radford Pax asap for sleep apnea.   Any Other Special Instructions Will Be Listed Below (If Applicable).     If you need a refill on your cardiac medications before your next appointment, please call your pharmacy.   Patients taking blood thinners like aspirin should generally limit medicines like ibuprofen, Advil, Motrin, naproxen, and Aleve due to risk of stomach bleeding. You may take Tylenol as directed or talk to your primary doctor about alternatives.     You are scheduled for Cardiac MRI on ______________. Please arrive for your appointment at ______________ ( arrive 30-45 minutes prior to test start time). ?  [facility] [address] [place], [place] [address] [phone] Please take advantage of the free valet parking available at the MAIN entrance (A entrance).  Proceed to the [facility] Radiology Department (First Floor) for check-in.   [facility] [place], [place] [address] [phone] Please take advantage of the free valet parking available at the MAIN entrance. Proceed to [facility] registration for check-in (first floor).  Magnetic resonance imaging (MRI) is a painless test that produces images of the inside of the body without using Xrays.  During an MRI, strong magnets and radio waves work together in a Research officer, political party to form detailed images.   MRI images may provide more details about a medical condition than X-rays, CT scans, and ultrasounds can provide.  You may be given earphones to listen for instructions.  You may eat a light breakfast and take  medications as ordered with the exception of HCTZ (fluid pill, other). Please avoid stimulants for 12 hr prior to test. (Ie. Caffeine, nicotine, chocolate, or antihistamine medications)  If a contrast material will be used, an IV will be inserted into one of your veins. Contrast material will be injected into your IV. It will leave your body through your urine within a day. You may be told to drink plenty of fluids to help flush the contrast material out of your system.  You will be asked to remove all metal, including: Watch, jewelry, and other metal objects including hearing aids, hair pieces and dentures. Also wearable glucose monitoring systems (ie. Freestyle Libre and Omnipods) (Braces and fillings normally are not a problem.)   TEST WILL TAKE APPROXIMATELY 1 HOUR  PLEASE NOTIFY SCHEDULING AT LEAST 24 HOURS IN ADVANCE IF YOU ARE UNABLE TO KEEP YOUR APPOINTMENT. [phone]  Please call Marchia Bond, cardiac imaging nurse navigator with any questions/concerns. Marchia Bond RN Navigator Cardiac Imaging Gordy Clement RN Navigator Cardiac Imaging [facility] [phone] Office

## 2021-08-24 NOTE — Telephone Encounter (Signed)
Pt came in office today for f/u appt w/ Dayna Dunn, PA-C. Informed staff that Delene Loll was approved with appeal to [employer].   Will route to provider as FYI.

## 2021-08-25 ENCOUNTER — Other Ambulatory Visit (HOSPITAL_COMMUNITY)
Admission: RE | Admit: 2021-08-25 | Discharge: 2021-08-25 | Disposition: A | Discharge status: Home / Self Care | Payer: BC Managed Care – PPO | Source: Ambulatory Visit | Attending: Physician Assistant | Admitting: Physician Assistant

## 2021-08-25 ENCOUNTER — Telehealth: Payer: Self-pay

## 2021-08-25 DIAGNOSIS — I5022 Chronic systolic (congestive) heart failure: Secondary | ICD-10-CM

## 2021-08-25 DIAGNOSIS — I428 Other cardiomyopathies: Secondary | ICD-10-CM | POA: Insufficient documentation

## 2021-08-25 MED ORDER — DAPAGLIFLOZIN PROPANEDIOL 10 MG PO TABS: 10.0000 mg | ORAL_TABLET | Freq: Every day | ORAL | 6 refills | Status: DC

## 2021-08-25 NOTE — Telephone Encounter (Signed)
Patient notified and verbalized understanding. Pt will pick up new medication from pharmacy.

## 2021-08-25 NOTE — Telephone Encounter (Signed)
-----   Message from Charlie Pitter, [place] sent at 08/25/2021 10:49 AM EDT ----- Caryl Pina - please let patient know his BMET looked stable. Mild elevation of blood sugar. Since he had already started [facility], would continue the 24/'26mg'$  BID dose. K 3.6 but still wnl - would increase potassium rich foods in diet. I am not certain based on historical BP/OV yesterday that he would tolerate on going up on Entresto dose so would like to trial addition of medicine called Farxiga '10mg'$  daily (as previously suggested by Dr. Harl Bowie) which is another agent we use in the management of heart failure. Please make sure he monitors for any signs of skin infection or urinary tract infection, as well as any symptoms of increased dizziness. Check BMET in 1 week and otherwise f/u 4-6 weeks as previously suggested in [place] yesterday.  B12/folate level were done for his peripheral neuropathy - looks like they resulted to Dr. Harl Bowie instead of me- let patient know they were normal. (Will cc Dr. Harl Bowie here so he is aware no need to act on.) We are otherwise investigating other processes that may link together his CHF + peripheral neuropathy, await completion of labs.

## 2021-08-25 NOTE — Telephone Encounter (Signed)
-----   Message from Charlie Pitter, [place] sent at 08/25/2021 10:49 AM EDT ----- Caryl Pina - please let patient know his BMET looked stable. Mild elevation of blood sugar. Since he had already started [facility], would continue the 24/'26mg'$  BID dose. K 3.6 but still wnl - would increase potassium rich foods in diet. I am not certain based on historical BP/OV yesterday that he would tolerate on going up on Entresto dose so would like to trial addition of medicine called Farxiga '10mg'$  daily (as previously suggested by Dr. Harl Bowie) which is another agent we use in the management of heart failure. Please make sure he monitors for any signs of skin infection or urinary tract infection, as well as any symptoms of increased dizziness. Check BMET in 1 week and otherwise f/u 4-6 weeks as previously suggested in [place] yesterday.  B12/folate level were done for his peripheral neuropathy - looks like they resulted to Dr. Harl Bowie instead of me- let patient know they were normal. (Will cc Dr. Harl Bowie here so he is aware no need to act on.) We are otherwise investigating other processes that may link together his CHF + peripheral neuropathy, await completion of labs.

## 2021-08-28 LAB — PROTEIN ELECTROPHORESIS, SERUM
A/G Ratio: 1.4 (ref 0.7–1.7)
Albumin ELP: 3.8 g/dL (ref 2.9–4.4)
Alpha-1-Globulin: 0.2 g/dL (ref 0.0–0.4)
Alpha-2-Globulin: 0.7 g/dL (ref 0.4–1.0)
Beta Globulin: 1 g/dL (ref 0.7–1.3)
Gamma Globulin: 0.9 g/dL (ref 0.4–1.8)
Globulin, Total: 2.7 g/dL (ref 2.2–3.9)
Total Protein ELP: 6.5 g/dL (ref 6.0–8.5)

## 2021-08-28 LAB — MULTIPLE MYELOMA PANEL, SERUM
Albumin SerPl Elph-Mcnc: 3.8 g/dL (ref 2.9–4.4)
Albumin/Glob SerPl: 1.5 (ref 0.7–1.7)
Alpha 1: 0.2 g/dL (ref 0.0–0.4)
Alpha2 Glob SerPl Elph-Mcnc: 0.7 g/dL (ref 0.4–1.0)
B-Globulin SerPl Elph-Mcnc: 1 g/dL (ref 0.7–1.3)
Gamma Glob SerPl Elph-Mcnc: 0.8 g/dL (ref 0.4–1.8)
Globulin, Total: 2.7 g/dL (ref 2.2–3.9)
IgA: 275 mg/dL (ref 61–437)
IgG (Immunoglobin G), Serum: 888 mg/dL (ref 603–1613)
IgM (Immunoglobulin M), Srm: 68 mg/dL (ref 15–143)
Total Protein ELP: 6.5 g/dL (ref 6.0–8.5)

## 2021-08-29 LAB — UPEP/UIFE/LIGHT CHAINS/TP, 24-HR UR
% BETA, Urine: 27.7 %
ALPHA 1 URINE: 3.8 %
Albumin, U: 27.1 %
Alpha 2, Urine: 14 %
Free Kappa Lt Chains,Ur: 62.61 mg/L (ref 1.17–86.46)
Free Kappa/Lambda Ratio: 6.14 (ref 1.83–14.26)
Free Lambda Lt Chains,Ur: 10.19 mg/L (ref 0.27–15.21)
GAMMA GLOBULIN URINE: 27.3 %
Total Protein, Urine-Ur/day: 81 mg/24 hr (ref 30–150)
Total Protein, Urine: 11.5 mg/dL
Total Volume: 700

## 2021-08-30 DIAGNOSIS — M25562 Pain in left knee: Secondary | ICD-10-CM | POA: Diagnosis not present

## 2021-08-30 DIAGNOSIS — M1712 Unilateral primary osteoarthritis, left knee: Secondary | ICD-10-CM | POA: Diagnosis not present

## 2021-09-05 DIAGNOSIS — M170 Bilateral primary osteoarthritis of knee: Secondary | ICD-10-CM | POA: Diagnosis not present

## 2021-09-27 DIAGNOSIS — M1712 Unilateral primary osteoarthritis, left knee: Secondary | ICD-10-CM | POA: Diagnosis not present

## 2021-10-04 DIAGNOSIS — M1712 Unilateral primary osteoarthritis, left knee: Secondary | ICD-10-CM | POA: Diagnosis not present

## 2021-10-11 DIAGNOSIS — M1712 Unilateral primary osteoarthritis, left knee: Secondary | ICD-10-CM | POA: Diagnosis not present

## 2021-10-12 LAB — HM COLONOSCOPY

## 2021-10-25 ENCOUNTER — Telehealth (HOSPITAL_COMMUNITY): Payer: Self-pay | Admitting: Emergency Medicine

## 2021-10-25 NOTE — Telephone Encounter (Signed)
Reaching out to patient to offer assistance regarding upcoming cardiac imaging study; pt verbalizes understanding of appt date/time, parking situation and where to check in, pre-test NPO status and medications ordered, and verified current allergies; name and call back number provided for further questions should they arise Marchia Bond RN Navigator Cardiac Imaging [facility] [phone] office [phone] cell  Arrival 1130 Denies metal Denies claustr Denies iv issues

## 2021-10-26 ENCOUNTER — Ambulatory Visit (HOSPITAL_COMMUNITY)
Admission: RE | Admit: 2021-10-26 | Discharge: 2021-10-26 | Disposition: A | Discharge status: Home / Self Care | Payer: BC Managed Care – PPO | Source: Ambulatory Visit | Attending: Physician Assistant | Admitting: Physician Assistant

## 2021-10-26 DIAGNOSIS — I428 Other cardiomyopathies: Secondary | ICD-10-CM | POA: Insufficient documentation

## 2021-10-26 MED ORDER — GADOBUTROL 1 MMOL/ML IV SOLN
10.0000 mL | Freq: Once | INTRAVENOUS | Status: AC | PRN
  Administered 2021-10-26: 10 mL via INTRAVENOUS

## 2021-10-28 ENCOUNTER — Telehealth: Payer: Self-pay | Admitting: Physician Assistant

## 2021-10-28 MED ORDER — APIXABAN 5 MG PO TABS: 5.0000 mg | ORAL_TABLET | Freq: Two times a day (BID) | ORAL | 3 refills | Status: DC

## 2021-10-28 NOTE — Telephone Encounter (Addendum)
   Received inbox msg from Dr. 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 Otherwise the study did not show any infiltrative disease. I reviewed with DOD Dr. Gasper Sells who agrees a very small thrombus appears to be present and that anticoagulation is indicated. He felt that he would typically treat this patient with Eliquis '5mg'$  BID without loading dose given the chronicity of the finding/prior suggestion of low flow on echo earlier this year, but that Coumadin could also be offered as well without bridge. He also suggested to stop aspirin when starting anticoagulant. I spoke with patient about risks, benefits, indication, and the options. He would really prefer the Eliquis. Rx [facility] per patient request. He also knows to stop ASA. We also reviewed avoiding ibuprofen as we'd discussed at recent OV - he reports he is now taking Tylenol PRN OTC as directed instead. Discussed warning sx of bleeding. No recent issues reported.  Will send a msg to our [place] office to have patient come in for updated baseline CBC/BMET early this week. Will keep f/u as scheduled 11/20/21 -> recommend arranging limited echo in 3 months at that visit. Will cc to Finis Bud just so she is in the loop for when she meets him. Will also cc Dr. Harl Bowie as [facility].  Charlie Pitter, PA-C

## 2021-10-30 ENCOUNTER — Ambulatory Visit: Payer: Medicare Other | Admitting: Cardiology

## 2021-11-19 NOTE — Progress Notes (Unsigned)
Cardiology Office Note:    Date:  11/20/2021   ID:  Benjamin [name], DOB May 27, 1950, MRN [id number]  PCP:  Susy Frizzle, MD   [place] Providers Cardiologist:  Carlyle Dolly, MD     Referring MD: Susy Frizzle, MD   CC: Here for follow-up  History of Present Illness:    Benjamin [name] is a 71 y.o. male with a hx of the following:  CAD Chronic HFrEF, NICM HTN HLD T2DM OSA Obesity  Previous cardiovascular history of remote MI in 2001 with stent to the LAD.  In 2017, LVEF reduced to 25 to 30%, cath revealed patent coronaries, therefore LV dysfunction was due to nonischemic cardiomyopathy.  Unfortunately, patient's insurance has turned down Entresto despite appeals, therefore patient has been on ARB instead.  EF improved to 45 to 50% in September 2019 and most recently EF declined to 25 to 30% in May 2023. This Echo showed that flow was slow and revealed loosely organized material that was thrombotic at LV apex, however there was no clear mural thrombus formed.  Repeat cardiac catheterization revealed nonobstructive CAD with patent proximal LAD stent with mild in-stent restenosis and mild disease elsewhere, normal filling pressures were identified with normal pulmonary artery pulsatility index.  In June 2023 Aldactone was added to GDMT with future plan to consider Wilder Glade once issue of Delene Loll was sorted out.  Last seen by Melina Copa, PA-C on 08/24/2021 for follow-up. His insurance approved Entresto and was found to be taking 24/26 mg BID.  Reported chronic history of infrequent orthostasis upon standing, not bothersome per his report, noted some peripheral neuropathy.  Overall was doing well from a cardiac perspective.  Hinton Dyer discussed echocardiogram findings with Dr. Harl Bowie, and decision was made for patient to undertake cardiac MRI to evaluate for any evidence of infiltrative disease to explain drop in EF and to further evaluate the thrombotic organization as seen  on echocardiogram. For patient's peripheral neuropathy, B12 and folate levels were checked in addition to myeloma panel, SPEP, UPEP to evaluate for any amyloid, overall lab results were unremarkable. cMRI in October 2023 revealed LVEF 35% with RVEF 51%, possible very small LV apical thrombus, LGE pattern was suggestive of prior LAD territory MI, suspect ischemic cardiomyopathy. Dayna discussed this case with DOD, Dr. Gasper Sells, who agreed a small thrombus was present and blood thinner was needed. Patient started on Eliquis 5 mg BID.   Today he presents for follow-up. 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  He does report concerns regarding the cost of Entresto and several other of his medications.  He does admit to rare sensations of chest discomfort, not bothersome per his report.  Denies any shortness of breath, palpitations, orthopnea, PND, dizziness, syncope, presyncope, swelling, significant weight changes, acute bleeding, or claudication.  He does note long, chronic history of peripheral neuropathy, states his symptoms are better improved with the use of his sketchers shoes.  Denies any other questions or concerns today.  Past Medical History:  Diagnosis Date   Allergy    ASCVD (arteriosclerotic cardiovascular disease)    Diabetes mellitus without complication ([place])    prediabetes   Hyperlipidemia    Hypertension    Myocardial infarction ([place]) 04/20/1999   Obesity (BMI 30-39.9)    OSA (obstructive sleep apnea) 11/02/2015    Past Surgical History:  Procedure Laterality Date   CARDIAC CATHETERIZATION N/A 05/19/2015   Procedure: Right/Left Heart Cath and Coronary  Angiography;  Surgeon: Belva Crome, MD;  Location: [facility];  Service: Cardiovascular;  Laterality: N/A;   CORONARY STENT PLACEMENT  04/18/1999   RIGHT/LEFT HEART CATH AND CORONARY ANGIOGRAPHY N/A 06/21/2021   Procedure: RIGHT/LEFT HEART CATH AND CORONARY ANGIOGRAPHY;   Surgeon: Early Osmond, MD;  Location: [facility];  Service: Cardiovascular;  Laterality: N/A;    Current Medications: Current Meds  Medication Sig   acetaminophen (TYLENOL) 500 MG tablet Take 1,000 mg by mouth every 6 (six) hours as needed for moderate pain.   apixaban (ELIQUIS) 5 MG TABS tablet Take 1 tablet (5 mg total) by mouth 2 (two) times daily.   atorvastatin (LIPITOR) 80 MG tablet TAKE ONE TABLET BY MOUTH EVERY EVENING AT 6PM   carvedilol (COREG) 3.125 MG tablet TAKE ONE TABLET BY MOUTH TWICE A DAY   Cholecalciferol (VITAMIN D) 2000 UNITS CAPS Take 1 capsule by mouth daily.    dapagliflozin propanediol (FARXIGA) 10 MG TABS tablet Take 1 tablet (10 mg total) by mouth daily before breakfast.   ENTRESTO 24-26 MG Take 1 tablet by mouth 2 (two) times daily.   spironolactone (ALDACTONE) 25 MG tablet Take 0.5 tablets (12.5 mg total) by mouth daily.   vitamin B-12 (CYANOCOBALAMIN) 1000 MCG tablet Take 1,000 mcg by mouth daily.     Allergies:   Penicillins   Social History   Socioeconomic History   Marital status: Married    Spouse name: Arville Go   Number of children: 2   Years of education: Not on file   Highest education level: Not on file  Occupational History   Not on file  Tobacco Use   Smoking status: Former    Packs/day: 0.50    Years: 8.00    Total pack years: 4.00    Types: Cigarettes    Start date: 07/24/1966    Quit date: 07/24/1974    Years since quitting: 47.3    Passive exposure: Past   Smokeless tobacco: Never  Vaping Use   Vaping Use: Never used  Substance and Sexual Activity   Alcohol use: No    Alcohol/week: 0.0 standard drinks of alcohol   Drug use: No   Sexual activity: Yes    Birth control/protection: None  Other Topics Concern   Not on file  Social History Narrative   Pt currently works at [employer].    Social Determinants of Health   Financial Resource Strain: Low Risk  (03/09/2021)   Overall Financial Resource Strain  (CARDIA)    Difficulty of Paying Living Expenses: Not hard at all  Food Insecurity: No Food Insecurity (03/09/2021)   Hunger Vital Sign    Worried About Running Out of Food in the Last Year: Never true    Ran Out of Food in the Last Year: Never true  Transportation Needs: No Transportation Needs (03/09/2021)   PRAPARE - Hydrologist (Medical): No    Lack of Transportation (Non-Medical): No  Physical Activity: Inactive (03/09/2021)   Exercise Vital Sign    Days of Exercise per Week: 0 days    Minutes of Exercise per Session: 0 min  Stress: No Stress Concern Present (03/09/2021)   [place]    Feeling of Stress : Not at all  Social Connections: Moderately Isolated (03/09/2021)   Social Connection and Isolation Panel [NHANES]    Frequency of Communication with Friends and Family: More than three times a week  Frequency of Social Gatherings with Friends and Family: More than three times a week    Attends Religious Services: Never    Marine scientist or Organizations: No    Attends Archivist Meetings: Never    Marital Status: Married     Family History: The patient's family history includes AAA (abdominal aortic aneurysm) in his father.  ROS: Review of Systems  Constitutional: Negative.   HENT: Negative.    Eyes: Negative.   Respiratory: Negative.    Cardiovascular:  Positive for chest pain. Negative for palpitations, orthopnea, claudication, leg swelling and PND.       See HPI.   Gastrointestinal: Negative.   Genitourinary: Negative.   Musculoskeletal: Negative.   Skin: Negative.   Neurological: Negative.   Endo/Heme/Allergies: Negative.   Psychiatric/Behavioral: Negative.        Please see the history of present illness.    All other systems reviewed and are negative.  EKGs/Labs/Other Studies Reviewed:    The following studies were reviewed today:   Cardiac MRI  on October 26, 2021: IMPRESSION: 1. Mildly dilated LV with wall motion abnormalities as noted above. LV EF 35%.   2.  Normal RV size and systolic function, EF 14%.   3.  Possible very small LV apical thrombus.   4. LGE pattern suggestive of prior LAD territory MI, suspect ischemic cardiomyopathy.    Right and left heart cath on June 21, 2021:  Dist Cx lesion is 50% stenosed.   Prox LAD lesion is 20% stenosed.   Mid LAD lesion is 20% stenosed.   LV end diastolic pressure is normal.   1.  Patent proximal LAD stent with mild in-stent restenosis and mild disease elsewhere. 2.  LVEDP of 18 mmHg. 3.  Cardiac output of 8.46 L/min and cardiac index of 3.6 L/min/m by Fick. 4.  Relatively normal filling pressures with a mean RA of 2 mmHg, mean wedge of 9 mmHg, mean PA of 19 mmHg, with normal pulmonary artery pulsatility index.   Recommendation: Goal-directed medical therapy for nonischemic cardiomyopathy.    Limited echo on May 24, 2021:  1. Limited study.   2. Left ventricular ejection fraction, by estimation, is 25 to 30%. The  left ventricle has severely decreased function. The left ventricle  demonstrates regional wall motion abnormalities (see scoring  diagram/findings for description). The left  ventricular internal cavity size was mildly dilated.   3. Definity contrast shows slow flow and loosely organized thrombotic  material at LV apex, although no clearly formed mural thrombus.   4. Right ventricular systolic function is normal. The right ventricular  size is normal.   Comparison(s): Prior images reviewed side by side. LVEF decreased relative  to prior study.  Right and left heart cath on May 19, 2015: Prox RCA to Mid RCA lesion, 20% stenosed. Ost LAD to Prox LAD lesion, 30% stenosed. The lesion was previously treated with a stent (unknown type). Dist Cx lesion, 50% stenosed.   Severe anterior apical hypokinesis. Estimated ejection fraction 35%. Normal left ventricular  hemodynamics with EDP of 14 mmHg and mean capillary wedge 17 mmHg. Widely patent coronary arteries with 30% diffuse ISR in the proximal LAD stent. There is 50% eccentric stenosis in the distal circumflex. Luminal irregularities are noted in the right coronary.   RECOMMENDATIONS:   Aggressive risk factor modification to prevent progression of CAD Aggressive heart failure therapy to minimize progression of LV dysfunction.  Recent Labs: 03/17/2021: TSH 1.05 06/26/2021: ALT 20; Hemoglobin 14.6;  Platelets 218 08/24/2021: BUN 18; Creatinine, Ser 1.01; Potassium 3.6; Sodium 138  Recent Lipid Panel    Component Value Date/Time   CHOL 131 03/17/2021 0920   TRIG 99 03/17/2021 0920   HDL 51 03/17/2021 0920   CHOLHDL 2.6 03/17/2021 0920   VLDL 29 04/27/2016 0822   LDLCALC 62 03/17/2021 0920    Physical Exam:    VS:  BP 102/70 (BP Location: Right Arm, Patient Position: Sitting, Cuff Size: Large)   Pulse 65   Ht '5\' 10"'$  (1.778 m)   Wt 260 lb 3.2 oz (118 kg)   SpO2 92%   BMI 37.33 kg/m     Wt Readings from Last 3 Encounters:  11/20/21 260 lb 3.2 oz (118 kg)  08/24/21 273 lb 3.2 oz (123.9 kg)  06/26/21 265 lb (120.2 kg)     GEN: Obese, 71 y.o. male in no acute distress HEENT: Normal NECK: No JVD; No carotid bruits CARDIAC: S1/S2, RRR, no murmurs, rubs, gallops; 2+ peripheral pulses throughout, strong and equal bilaterally RESPIRATORY:  Clear and diminished to auscultation without rales, wheezing or rhonchi  ABDOMEN: Soft, non-tender, distended, bowel sounds x4 MUSCULOSKELETAL:  No edema; No deformity  SKIN: Warm and dry NEUROLOGIC:  Alert and oriented x 3 PSYCHIATRIC:  Normal affect   ASSESSMENT:    1. Chronic HFrEF (heart failure with reduced ejection fraction) ([place])   2. Left ventricular apical thrombus   3. CAD in native artery   4. Hyperlipidemia LDL goal <70   5. Essential hypertension, benign   6. Idiopathic peripheral neuropathy   7. Obesity (BMI 30-39.9)   8. OSA on  CPAP    PLAN:    In order of problems listed above:  Chronic HFrEF Recent cardiac MRI revealed LVEF 35%.  Previous right and left heart cath in June 2023 revealed relatively normal filling pressures. Euvolemic and well compensated on exam. Low sodium diet, fluid restriction <2L, and daily weights encouraged. Educated to contact our office for weight gain of 2 lbs overnight or 5 lbs in one week.  Continue carvedilol, Wilder Glade, Entresto, and spironolactone.  Currently blood pressure prevents up titration of GDMT at this time. Heart healthy diet and regular cardiovascular exercise as tolerated encouraged. Will provide patient assistance regarding Entresto cost. Will refill spironolactone upon his request.  Left ventricular apical thrombus Cardiac MRI revealed possible very small left ventricular apical thrombus and case was discussed with Dr. Gasper Sells who agreed that anticoagulation was needed.  Currently tolerating Eliquis well.  He is on appropriate dosage of Eliquis.  Continue Eliquis 5 mg twice daily. Will obtain CBC today.   CAD, status post remote MI with PCI/DES to LAD in 2001 Admits to rare sensations of chest discomfort, not bothersome per his report.  Right and left heart cath in June 2023 revealed patent proximal LAD stent with mild in-stent restenosis and mild disease elsewhere.  Medical therapy was recommended.  We will continue medical therapy at this time and will continue Lipitor and carvedilol. Heart healthy diet and regular cardiovascular exercise encouraged. ED precautions discussed.   Hyperlipidemia Lipid panel this year revealed total cholesterol 131, HDL 51, triglycerides 99, and LDL 62.  Continue Lipitor. Heart healthy diet and regular cardiovascular exercise encouraged.   Hypertension Blood pressure today 102/70.  He is asymptomatic with this blood pressure.  Previous blood pressures at prior office visits reveal 116/66 and 108/68.  Currently blood pressure does not allow  titration of GDMT.  Will reevaluate at next OV regarding uptitrating GDMT  if blood pressure allows. Continue carvedilol and spironolactone. Will obtain BMET today. Heart healthy diet and regular cardiovascular exercise encouraged.   Peripheral neuropathy B12, folate, as well as other labwork arranged by Melina Copa, PA-C were overall unremarkable. Last HgA1C was 6.6% in March 2023, therefore this does indicate diabetes, however I don't see where his PCP has stated this diagnosis. Continue to follow with PCP. Heart healthy diet and regular cardiovascular exercise encouraged.   Obesity BMI today 37.33. Weight loss via diet and exercise encouraged. Discussed the impact being overweight would have on cardiovascular risk. Heart healthy diet and regular cardiovascular exercise encouraged.   OSA on CPAP Continued compliance encouraged.     9. Disposition: Follow up with me or Dr. Carlyle Dolly in 6 to 7 weeks or sooner if anything changes.  Medication Adjustments/Labs and Tests Ordered: Current medicines are reviewed at length with the patient today.  Concerns regarding medicines are outlined above.  Orders Placed This Encounter  Procedures   CBC   Basic metabolic panel   EKG 09-NATF   ECHOCARDIOGRAM LIMITED   ECHOCARDIOGRAM LIMITED   No orders of the defined types were placed in this encounter.   Patient Instructions  Medication Instructions:  Your physician recommends that you continue on your current medications as directed. Please refer to the Current Medication list given to you today.   Labwork: CBC, CMET (1 week @ [facility])  Testing/Procedures: Your physician has requested that you have an echocardiogram. Echocardiography is a painless test that uses sound waves to create images of your heart. It provides your doctor with information about the size and shape of your heart and how well your heart's chambers and valves are working. This procedure takes approximately one hour.  There are no restrictions for this procedure. Please do NOT wear cologne, perfume, aftershave, or lotions (deodorant is allowed). Please arrive 15 minutes prior to your appointment time.   Follow-Up:  Your physician recommends that you schedule a follow-up appointment in: 6-7 weeks  Any Other Special Instructions Will Be Listed Below (If Applicable).  If you need a refill on your cardiac medications before your next appointment, please call your pharmacy.    Signed, Finis Bud, NP  11/20/2021 8:04 PM    [place]

## 2021-11-20 ENCOUNTER — Ambulatory Visit: Payer: BC Managed Care – PPO | Attending: Nurse Practitioner | Admitting: Nurse Practitioner

## 2021-11-20 ENCOUNTER — Encounter: Payer: Self-pay | Admitting: Nurse Practitioner

## 2021-11-20 VITALS — BP 102/70 | HR 65 | Ht 70.0 in | Wt 260.2 lb

## 2021-11-20 DIAGNOSIS — I513 Intracardiac thrombosis, not elsewhere classified: Secondary | ICD-10-CM

## 2021-11-20 DIAGNOSIS — I10 Essential (primary) hypertension: Secondary | ICD-10-CM

## 2021-11-20 DIAGNOSIS — I2510 Atherosclerotic heart disease of native coronary artery without angina pectoris: Secondary | ICD-10-CM | POA: Diagnosis not present

## 2021-11-20 DIAGNOSIS — E785 Hyperlipidemia, unspecified: Secondary | ICD-10-CM

## 2021-11-20 DIAGNOSIS — I5022 Chronic systolic (congestive) heart failure: Secondary | ICD-10-CM

## 2021-11-20 DIAGNOSIS — G609 Hereditary and idiopathic neuropathy, unspecified: Secondary | ICD-10-CM

## 2021-11-20 DIAGNOSIS — E669 Obesity, unspecified: Secondary | ICD-10-CM

## 2021-11-20 DIAGNOSIS — G4733 Obstructive sleep apnea (adult) (pediatric): Secondary | ICD-10-CM

## 2021-11-20 MED ORDER — SPIRONOLACTONE 25 MG PO TABS: 12.5000 mg | ORAL_TABLET | Freq: Every day | ORAL | 1 refills | Status: DC

## 2021-11-20 NOTE — Patient Instructions (Signed)
Medication Instructions:  Your physician recommends that you continue on your current medications as directed. Please refer to the Current Medication list given to you today.   Labwork: CBC, CMET (1 week @ [facility])  Testing/Procedures: Your physician has requested that you have an echocardiogram. Echocardiography is a painless test that uses sound waves to create images of your heart. It provides your doctor with information about the size and shape of your heart and how well your heart's chambers and valves are working. This procedure takes approximately one hour. There are no restrictions for this procedure. Please do NOT wear cologne, perfume, aftershave, or lotions (deodorant is allowed). Please arrive 15 minutes prior to your appointment time.   Follow-Up:  Your physician recommends that you schedule a follow-up appointment in: 6-7 weeks  Any Other Special Instructions Will Be Listed Below (If Applicable).  If you need a refill on your cardiac medications before your next appointment, please call your pharmacy.

## 2021-11-22 ENCOUNTER — Other Ambulatory Visit (HOSPITAL_COMMUNITY)
Admission: RE | Admit: 2021-11-22 | Discharge: 2021-11-22 | Disposition: A | Discharge status: Home / Self Care | Payer: BC Managed Care – PPO | Source: Ambulatory Visit | Attending: Nurse Practitioner | Admitting: Nurse Practitioner

## 2021-11-22 DIAGNOSIS — I5022 Chronic systolic (congestive) heart failure: Secondary | ICD-10-CM | POA: Insufficient documentation

## 2021-11-22 DIAGNOSIS — I513 Intracardiac thrombosis, not elsewhere classified: Secondary | ICD-10-CM | POA: Diagnosis present

## 2021-11-22 LAB — CBC
HCT: 49.9 % (ref 39.0–52.0)
Hemoglobin: 16.3 g/dL (ref 13.0–17.0)
MCH: 28.4 pg (ref 26.0–34.0)
MCHC: 32.7 g/dL (ref 30.0–36.0)
MCV: 87.1 fL (ref 80.0–100.0)
Platelets: 246 10*3/uL (ref 150–400)
RBC: 5.73 MIL/uL (ref 4.22–5.81)
RDW: 14.1 % (ref 11.5–15.5)
WBC: 8.7 10*3/uL (ref 4.0–10.5)
nRBC: 0 % (ref 0.0–0.2)

## 2021-11-22 LAB — BASIC METABOLIC PANEL
Anion gap: 4 — ABNORMAL LOW (ref 5–15)
BUN: 13 mg/dL (ref 8–23)
CO2: 29 mmol/L (ref 22–32)
Calcium: 8.6 mg/dL — ABNORMAL LOW (ref 8.9–10.3)
Chloride: 105 mmol/L (ref 98–111)
Creatinine, Ser: 1.05 mg/dL (ref 0.61–1.24)
GFR, Estimated: >60 mL/min (ref >60)
Glucose, Bld: 146 mg/dL — ABNORMAL HIGH (ref 70–99)
Potassium: 3.6 mmol/L (ref 3.5–5.1)
Sodium: 138 mmol/L (ref 135–145)

## 2021-12-08 ENCOUNTER — Other Ambulatory Visit: Payer: Self-pay | Admitting: Family Medicine

## 2022-01-05 ENCOUNTER — Other Ambulatory Visit: Payer: Self-pay | Admitting: Physician Assistant

## 2022-01-05 ENCOUNTER — Other Ambulatory Visit: Payer: Self-pay | Admitting: Cardiology

## 2022-01-08 ENCOUNTER — Encounter: Payer: Self-pay | Admitting: Nurse Practitioner

## 2022-01-08 ENCOUNTER — Ambulatory Visit: Payer: Medicare Other | Attending: Nurse Practitioner | Admitting: Nurse Practitioner

## 2022-01-08 VITALS — BP 122/70 | HR 48 | Ht 70.0 in | Wt 262.8 lb

## 2022-01-08 DIAGNOSIS — E669 Obesity, unspecified: Secondary | ICD-10-CM | POA: Insufficient documentation

## 2022-01-08 DIAGNOSIS — E785 Hyperlipidemia, unspecified: Secondary | ICD-10-CM | POA: Insufficient documentation

## 2022-01-08 DIAGNOSIS — I5020 Unspecified systolic (congestive) heart failure: Secondary | ICD-10-CM

## 2022-01-08 DIAGNOSIS — I10 Essential (primary) hypertension: Secondary | ICD-10-CM | POA: Insufficient documentation

## 2022-01-08 DIAGNOSIS — I2510 Atherosclerotic heart disease of native coronary artery without angina pectoris: Secondary | ICD-10-CM | POA: Diagnosis present

## 2022-01-08 DIAGNOSIS — G4733 Obstructive sleep apnea (adult) (pediatric): Secondary | ICD-10-CM | POA: Insufficient documentation

## 2022-01-08 DIAGNOSIS — I513 Intracardiac thrombosis, not elsewhere classified: Secondary | ICD-10-CM | POA: Diagnosis present

## 2022-01-08 NOTE — Patient Instructions (Addendum)
Medication Instructions:  Your physician has recommended you make the following change in your medication:  - Stop Coreg 3.125 mg tablets   Labwork: - None today  Testing/Procedures: - Keep your appointment for your Echo- 1/9 '@7'$ :30 in the [place] office.   Follow-Up: - Follow up with Finis Bud in 3 months.  - Follow up with Dr. Harl Bowie in 6 months.  Any Other Special Instructions Will Be Listed Below (If Applicable).     If you need a refill on your cardiac medications before your next appointment, please call your pharmacy.

## 2022-01-08 NOTE — Telephone Encounter (Signed)
Prescription refill request for Eliquis received. Indication:lv thrombus Last office visit:11/23 Scr:1.0 Age: 72 Weight:118 kg  Prescription refilled

## 2022-01-08 NOTE — Progress Notes (Signed)
Cardiology Office Note:    Date:  01/08/2022   ID:  [name], DOB 20-Mar-1950, MRN [id number]  PCP:  Susy Frizzle, MD   [facility] Cardiologist:  Carlyle Dolly, MD     Referring MD: Susy Frizzle, MD   CC: Here for follow-up   History of Present Illness:    Benjamin [name] is a 72 y.o. male with a hx of the following:  CAD Chronic HFrEF, NICM HTN HLD T2DM OSA Obesity  Previous cardiovascular history of remote MI in 2001 with stent to the LAD.  In 2017, LVEF reduced to 25 to 30%, cath revealed patent coronaries, therefore LV dysfunction was due to nonischemic cardiomyopathy.  Unfortunately, patient's insurance has turned down Entresto despite appeals, therefore patient has been on ARB instead.  EF improved to 45 to 50% in September 2019 and most recently EF declined to 25 to 30% in May 2023. This Echo showed that flow was slow and revealed loosely organized material that was thrombotic at LV apex, however there was no clear mural thrombus formed.  Repeat cardiac catheterization revealed nonobstructive CAD with patent proximal LAD stent with mild in-stent restenosis and mild disease elsewhere, normal filling pressures were identified with normal pulmonary artery pulsatility index.  In June 2023 Aldactone was added to GDMT with future plan to consider Wilder Glade once issue of Delene Loll was sorted out.  Last seen by Melina Copa, PA-C on 08/24/2021 for follow-up. His insurance approved Entresto and was found to be taking 24/26 mg BID.  Reported chronic history of infrequent orthostasis upon standing, not bothersome per his report, noted some peripheral neuropathy.  Overall was doing well from a cardiac perspective.  Hinton Dyer discussed echocardiogram findings with Dr. Harl Bowie, and decision was made for patient to undertake cardiac MRI to evaluate for any evidence of infiltrative disease to explain drop in EF and to further evaluate the thrombotic organization as seen on  echocardiogram. For patient's peripheral neuropathy, B12 and folate levels were checked in addition to myeloma panel, SPEP, UPEP to evaluate for any amyloid, overall lab results were unremarkable. cMRI in October 2023 revealed LVEF 35% with RVEF 51%, possible very small LV apical thrombus, LGE pattern was suggestive of prior LAD territory MI, suspect ischemic cardiomyopathy. Dayna discussed this case with DOD, Dr. Gasper Sells, who agreed a small thrombus was present and blood thinner was needed. Patient started on Eliquis 5 mg BID.   I saw him for follow up on 11/20/2021. 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  He did report concerns regarding the cost of Entresto and several other of his medications.  He did admit to rare sensations of chest discomfort, not bothersome per his report.  Denied any shortness of breath, palpitations, orthopnea, PND, dizziness, syncope, presyncope, swelling, significant weight changes, acute bleeding, or claudication.  He did note long, chronic history of peripheral neuropathy, stated his symptoms were better improved with the use of his sketchers shoes. Labs were stable. Refilled Aldactone. Told to follow up in 6-7 weeks.   Today he presents for follow-up.  He has officially retired.  Denies any chest pain, shortness of breath, syncope, near syncope, dizziness, orthopnea, PND, swelling, significant weight changes, acute bleeding, or claudication.  Tolerating his medications well.  Does admit to occasional lightheadedness in the morning when first getting up, not bothersome per his report.  States he knows how to change positions slowly.  Denies any other questions or concerns today.   Past Medical  History:  Diagnosis Date   Allergy    ASCVD (arteriosclerotic cardiovascular disease)    Diabetes mellitus without complication ([place])    prediabetes   HFrEF (heart failure with reduced ejection fraction) ([facility])    Hyperlipidemia    Hypertension     Left ventricular apical thrombus    Myocardial infarction ([place]) 04/20/1999   Obesity (BMI 30-39.9)    OSA (obstructive sleep apnea) 11/02/2015    Past Surgical History:  Procedure Laterality Date   CARDIAC CATHETERIZATION N/A 05/19/2015   Procedure: Right/Left Heart Cath and Coronary Angiography;  Surgeon: Belva Crome, MD;  Location: [facility];  Service: Cardiovascular;  Laterality: N/A;   CORONARY STENT PLACEMENT  04/18/1999   RIGHT/LEFT HEART CATH AND CORONARY ANGIOGRAPHY N/A 06/21/2021   Procedure: RIGHT/LEFT HEART CATH AND CORONARY ANGIOGRAPHY;  Surgeon: Early Osmond, MD;  Location: [facility];  Service: Cardiovascular;  Laterality: N/A;    Current Medications: Current Meds  Medication Sig   acetaminophen (TYLENOL) 500 MG tablet Take 1,000 mg by mouth every 6 (six) hours as needed for moderate pain.   apixaban (ELIQUIS) 5 MG TABS tablet TAKE 1 TABLET BY MOUTH TWICE A DAY   atorvastatin (LIPITOR) 80 MG tablet TAKE ONE TABLET BY MOUTH EVERY EVENING AT 6PM   Cholecalciferol (VITAMIN D) 2000 UNITS CAPS Take 1 capsule by mouth daily.    dapagliflozin propanediol (FARXIGA) 10 MG TABS tablet TAKE 1 TABLET BY MOUTH DAILY BEFORE BREAKFAST   sacubitril-valsartan (ENTRESTO) 24-26 MG TAKE 1 TABLET BY MOUTH TWICE A DAY   spironolactone (ALDACTONE) 25 MG tablet Take 0.5 tablets (12.5 mg total) by mouth daily.   vitamin B-12 (CYANOCOBALAMIN) 1000 MCG tablet Take 1,000 mcg by mouth daily.    carvedilol (COREG) 3.125 MG tablet TAKE ONE TABLET BY MOUTH TWICE A DAY     Allergies:   Penicillins   Social History   Socioeconomic History   Marital status: Married    Spouse name: Arville Go   Number of children: 2   Years of education: Not on file   Highest education level: Not on file  Occupational History   Not on file  Tobacco Use   Smoking status: Former    Packs/day: 0.50    Years: 8.00    Total pack years: 4.00    Types: Cigarettes    Start date: 07/24/1966    Quit  date: 07/24/1974    Years since quitting: 47.4    Passive exposure: Past   Smokeless tobacco: Never  Vaping Use   Vaping Use: Never used  Substance and Sexual Activity   Alcohol use: No    Alcohol/week: 0.0 standard drinks of alcohol   Drug use: No   Sexual activity: Yes    Birth control/protection: None  Other Topics Concern   Not on file  Social History Narrative   Pt currently works at [employer].    Social Determinants of Health   Financial Resource Strain: Low Risk  (03/09/2021)   Overall Financial Resource Strain (CARDIA)    Difficulty of Paying Living Expenses: Not hard at all  Food Insecurity: No Food Insecurity (03/09/2021)   Hunger Vital Sign    Worried About Running Out of Food in the Last Year: Never true    Ran Out of Food in the Last Year: Never true  Transportation Needs: No Transportation Needs (03/09/2021)   PRAPARE - Hydrologist (Medical): No    Lack of Transportation (Non-Medical): No  Physical Activity: Inactive (03/09/2021)   Exercise Vital Sign    Days of Exercise per Week: 0 days    Minutes of Exercise per Session: 0 min  Stress: No Stress Concern Present (03/09/2021)   [place]    Feeling of Stress : Not at all  Social Connections: Moderately Isolated (03/09/2021)   Social Connection and Isolation Panel [NHANES]    Frequency of Communication with Friends and Family: More than three times a week    Frequency of Social Gatherings with Friends and Family: More than three times a week    Attends Religious Services: Never    Marine scientist or Organizations: No    Attends Archivist Meetings: Never    Marital Status: Married     Family History: The patient's family history includes AAA (abdominal aortic aneurysm) in his father.  ROS: Review of Systems  Constitutional: Negative.   HENT: Negative.    Eyes: Negative.   Respiratory:  Negative.    Cardiovascular: Negative.   Gastrointestinal: Negative.   Genitourinary: Negative.   Musculoskeletal: Negative.   Skin: Negative.   Neurological: Negative.        See HPI.  Endo/Heme/Allergies: Negative.   Psychiatric/Behavioral: Negative.         Please see the history of present illness.    All other systems reviewed and are negative.  EKGs/Labs/Other Studies Reviewed:    The following studies were reviewed today:   Cardiac MRI on October 26, 2021: IMPRESSION: 1. Mildly dilated LV with wall motion abnormalities as noted above. LV EF 35%.   2.  Normal RV size and systolic function, EF 22%.   3.  Possible very small LV apical thrombus.   4. LGE pattern suggestive of prior LAD territory MI, suspect ischemic cardiomyopathy.    Right and left heart cath on June 21, 2021:  Dist Cx lesion is 50% stenosed.   Prox LAD lesion is 20% stenosed.   Mid LAD lesion is 20% stenosed.   LV end diastolic pressure is normal.   1.  Patent proximal LAD stent with mild in-stent restenosis and mild disease elsewhere. 2.  LVEDP of 18 mmHg. 3.  Cardiac output of 8.46 L/min and cardiac index of 3.6 L/min/m by Fick. 4.  Relatively normal filling pressures with a mean RA of 2 mmHg, mean wedge of 9 mmHg, mean PA of 19 mmHg, with normal pulmonary artery pulsatility index.   Recommendation: Goal-directed medical therapy for nonischemic cardiomyopathy.    Limited echo on May 24, 2021:  1. Limited study.   2. Left ventricular ejection fraction, by estimation, is 25 to 30%. The  left ventricle has severely decreased function. The left ventricle  demonstrates regional wall motion abnormalities (see scoring  diagram/findings for description). The left  ventricular internal cavity size was mildly dilated.   3. Definity contrast shows slow flow and loosely organized thrombotic  material at LV apex, although no clearly formed mural thrombus.   4. Right ventricular systolic function is  normal. The right ventricular  size is normal.   Comparison(s): Prior images reviewed side by side. LVEF decreased relative  to prior study.  Right and left heart cath on May 19, 2015: Prox RCA to Mid RCA lesion, 20% stenosed. Ost LAD to Prox LAD lesion, 30% stenosed. The lesion was previously treated with a stent (unknown type). Dist Cx lesion, 50% stenosed.   Severe anterior apical hypokinesis. Estimated ejection fraction 35%. Normal left  ventricular hemodynamics with EDP of 14 mmHg and mean capillary wedge 17 mmHg. Widely patent coronary arteries with 30% diffuse ISR in the proximal LAD stent. There is 50% eccentric stenosis in the distal circumflex. Luminal irregularities are noted in the right coronary.   RECOMMENDATIONS:   Aggressive risk factor modification to prevent progression of CAD Aggressive heart failure therapy to minimize progression of LV dysfunction.  Recent Labs: 03/17/2021: TSH 1.05 06/26/2021: ALT 20 11/22/2021: BUN 13; Creatinine, Ser 1.05; Hemoglobin 16.3; Platelets 246; Potassium 3.6; Sodium 138  Recent Lipid Panel    Component Value Date/Time   CHOL 131 03/17/2021 0920   TRIG 99 03/17/2021 0920   HDL 51 03/17/2021 0920   CHOLHDL 2.6 03/17/2021 0920   VLDL 29 04/27/2016 0822   LDLCALC 62 03/17/2021 0920    Physical Exam:    VS:  BP 122/70   Pulse (!) 48   Ht '5\' 10"'$  (1.778 m)   Wt 262 lb 12.8 oz (119.2 kg)   SpO2 96%   BMI 37.71 kg/m     Wt Readings from Last 3 Encounters:  01/08/22 262 lb 12.8 oz (119.2 kg)  11/20/21 260 lb 3.2 oz (118 kg)  08/24/21 273 lb 3.2 oz (123.9 kg)     GEN: Obese, 72 y.o. male in no acute distress HEENT: Normal NECK: No JVD; No carotid bruits CARDIAC: S1/S2, RRR, no murmurs, rubs, gallops; 2+ peripheral pulses throughout, strong and equal bilaterally, varicose veins of bilateral lower extremities RESPIRATORY:  Clear to auscultation without rales, wheezing or rhonchi  MUSCULOSKELETAL:  No edema; No deformity  SKIN:  Warm and dry NEUROLOGIC:  Alert and oriented x 3 PSYCHIATRIC:  Normal, pleasant affect   ASSESSMENT:    1. HFrEF (heart failure with reduced ejection fraction) ([place])   2. Left ventricular apical thrombus   3. Coronary artery disease involving native heart without angina pectoris, unspecified vessel or lesion type   4. Hyperlipidemia, unspecified hyperlipidemia type   5. Hypertension, unspecified type   6. Obesity (BMI 30-39.9)   7. OSA on CPAP     PLAN:    In order of problems listed above:  Chronic HFrEF Recent cardiac MRI revealed LVEF 35%.  Previous right and left heart cath in June 2023 revealed relatively normal filling pressures. Euvolemic and well compensated on exam. Low sodium diet, fluid restriction <2L, and daily weights encouraged. Educated to contact our office for weight gain of 2 lbs overnight or 5 lbs in one week.  Continue Lisabeth Register, and spironolactone.  Discontinue carvedilol for bradycardia.  Heart healthy diet and regular cardiovascular exercise as tolerated encouraged.  Left ventricular apical thrombus Cardiac MRI revealed possible very small left ventricular apical thrombus and case was previously discussed with Dr. Gasper Sells who agreed that anticoagulation was needed.  Currently tolerating Eliquis well.  He is on appropriate dosage of Eliquis.  Continue Eliquis 5 mg twice daily. Plan to undergo limited Echo tomorrow and ascertain if thrombus has resolved and Eliquis can be d/c.   3. CAD, status post remote MI with PCI/DES to LAD in 2001 Stable with no anginal symptoms. No indication for ischemic evaluation.  Right and left heart cath in June 2023 revealed patent proximal LAD stent with mild in-stent restenosis and mild disease elsewhere.  Medical therapy was recommended.  We will continue medical therapy at this time and will continue Lipitor.  Will discontinue carvedilol due to bradycardia.  Heart healthy diet and regular cardiovascular exercise  encouraged. ED precautions discussed.   Hyperlipidemia  Lipid panel this year revealed total cholesterol 131, HDL 51, triglycerides 99, and LDL 62.  Continue Lipitor. Heart healthy diet and regular cardiovascular exercise encouraged.   Hypertension Blood pressure today 122/70.  Discontinue carvedilol and continue spironolactone. Heart healthy diet and regular cardiovascular exercise encouraged. Discussed to monitor BP at home at least 2 hours after medications and sitting for 5-10 minutes. Recommended to obtain OMRON cuff.   Obesity BMI today 37.71. Weight loss via diet and exercise encouraged. Discussed the impact being overweight would have on cardiovascular risk. Heart healthy diet and regular cardiovascular exercise encouraged.  At next visit, discuss PREP program referral.  OSA on CPAP Continued compliance encouraged.     9. Disposition: Follow up with me in 3 months or sooner if anything changes. Follow up with Dr. Carlyle Dolly in 6 months.    Medication Adjustments/Labs and Tests Ordered: Current medicines are reviewed at length with the patient today.  Concerns regarding medicines are outlined above.  No orders of the defined types were placed in this encounter.  No orders of the defined types were placed in this encounter.   Patient Instructions  Medication Instructions:  Your physician has recommended you make the following change in your medication:  - Stop Coreg 3.125 mg tablets   Labwork: - None today  Testing/Procedures: - Keep your appointment for your Echo- 1/9 '@7'$ :30 in the [place] office.   Follow-Up: - Follow up with Finis Bud in 3 months.  - Follow up with Dr. Harl Bowie in 6 months.  Any Other Special Instructions Will Be Listed Below (If Applicable).     If you need a refill on your cardiac medications before your next appointment, please call your pharmacy.    SignedFinis Bud, NP  01/08/2022 8:55 AM    [place]

## 2022-01-09 ENCOUNTER — Ambulatory Visit: Payer: BC Managed Care – PPO | Attending: Nurse Practitioner

## 2022-01-09 DIAGNOSIS — I513 Intracardiac thrombosis, not elsewhere classified: Secondary | ICD-10-CM

## 2022-01-09 LAB — ECHOCARDIOGRAM LIMITED
Area-P 1/2: 2.76 cm2
Calc EF: 37.1 %
Est EF: 35
S' Lateral: 5 cm
Single Plane A2C EF: 40 %
Single Plane A4C EF: 38 %

## 2022-01-09 MED ORDER — PERFLUTREN LIPID MICROSPHERE
1.0000 mL | INTRAVENOUS | Status: AC | PRN
  Administered 2022-01-09: 3 mL via INTRAVENOUS

## 2022-01-17 ENCOUNTER — Telehealth: Payer: Self-pay | Admitting: *Deleted

## 2022-01-17 DIAGNOSIS — I5020 Unspecified systolic (congestive) heart failure: Secondary | ICD-10-CM

## 2022-01-17 DIAGNOSIS — Z79899 Other long term (current) drug therapy: Secondary | ICD-10-CM

## 2022-01-17 MED ORDER — ASPIRIN 81 MG PO TBEC: 81.0000 mg | DELAYED_RELEASE_TABLET | Freq: Every day | ORAL | 3 refills | Status: AC

## 2022-01-17 MED ORDER — APIXABAN 5 MG PO TABS: 5.0000 mg | ORAL_TABLET | Freq: Two times a day (BID) | ORAL | 0 refills | Status: DC

## 2022-01-17 MED ORDER — ENTRESTO 49-51 MG PO TABS: 1.0000 | ORAL_TABLET | Freq: Two times a day (BID) | ORAL | 6 refills | Status: DC

## 2022-01-17 NOTE — Telephone Encounter (Signed)
Patient informed and verbalized understanding of plan. Copy sent to PCP Lab order faxed to [facility]

## 2022-01-17 NOTE — Telephone Encounter (Signed)
-----  Message from Finis Bud, NP sent at 01/10/2022 10:37 AM EST ----- Echo reviewed and case d/w Dr. Harl Bowie.  Heart pumping function is still reduced at 35%.  Let's increase Entresto to 49/51 mg BID and repeat BMET in 2 weeks. No evidence of apical blood clot. He will continue Eliquis until the end of this month. Then he will stop Eliquis and restart baby ASA. Last day of Eliquis for this month will be 01/28/22 as this is when he started on Eliquis last fall. Should re-start baby ASA the day after stopping Eliquis on 01/29/22.   Thanks! Will have him follow up with me as scheduled.   Best, Finis Bud, AGNP-C

## 2022-01-18 ENCOUNTER — Encounter: Payer: Self-pay | Admitting: Family Medicine

## 2022-01-19 ENCOUNTER — Encounter: Payer: Self-pay | Admitting: Family Medicine

## 2022-02-20 ENCOUNTER — Other Ambulatory Visit: Payer: Medicare Other

## 2022-03-12 ENCOUNTER — Encounter: Payer: Self-pay | Admitting: Family Medicine

## 2022-04-09 ENCOUNTER — Ambulatory Visit: Payer: Medicare PPO | Attending: Nurse Practitioner | Admitting: Nurse Practitioner

## 2022-04-09 ENCOUNTER — Encounter: Payer: Self-pay | Admitting: Nurse Practitioner

## 2022-04-09 VITALS — BP 102/68 | HR 62 | Ht 70.0 in | Wt 271.8 lb

## 2022-04-09 DIAGNOSIS — I10 Essential (primary) hypertension: Secondary | ICD-10-CM

## 2022-04-09 DIAGNOSIS — E785 Hyperlipidemia, unspecified: Secondary | ICD-10-CM

## 2022-04-09 DIAGNOSIS — I2510 Atherosclerotic heart disease of native coronary artery without angina pectoris: Secondary | ICD-10-CM

## 2022-04-09 DIAGNOSIS — I513 Intracardiac thrombosis, not elsewhere classified: Secondary | ICD-10-CM | POA: Diagnosis not present

## 2022-04-09 DIAGNOSIS — I5022 Chronic systolic (congestive) heart failure: Secondary | ICD-10-CM

## 2022-04-09 DIAGNOSIS — G4733 Obstructive sleep apnea (adult) (pediatric): Secondary | ICD-10-CM

## 2022-04-09 DIAGNOSIS — E669 Obesity, unspecified: Secondary | ICD-10-CM

## 2022-04-09 NOTE — Progress Notes (Signed)
Cardiology Office Note:    Date:  04/09/2022   ID:  Benjamin Noonald Gulla, DOB 08-Apr-1950, MRN [id number]  PCP:  Donita BrooksPickard, Warren T, MD   [facility] Providers Cardiologist:  Dina RichBranch, Jonathan, MD     Referring MD: Donita BrooksPickard, Warren T, MD   CC: Here for follow-up   History of Present Illness:    Benjamin [name] is a very pleasant 72 y.o. male with a hx of the following:  CAD Chronic HFrEF, NICM HTN HLD T2DM OSA Obesity  Previous CV history of remote MI in 2001 with stent to the LAD.  In 2017, LVEF reduced to 25 to 30%, cath revealed patent coronaries.   EF improved to 45 to 50% in September 2019 and most recently EF declined to 25 to 30% in May 2023. This Echo showed that flow was slow and revealed loosely organized material that was thrombotic at LV apex, however no clear mural thrombus formed.  Repeat LHC evealed nonobstructive CAD with patent proximal LAD stent with mild in-stent restenosis and mild disease elsewhere, normal filling pressures were identified.  In June 2023 Aldactone was added to GDMT with future plan to consider Marcelline DeistFarxiga once issue of Sherryll Burgerntresto was sorted out.  Seen by Ronie Spiesayna Dunn, PA-C on 08/24/2021 for follow-up. Insurance approved [employer]o.  Reported chronic history of infrequent orthostasis upon standing, not bothersome per his report, noted some peripheral neuropathy. TTE findings d/w Dr. Wyline MoodBranch, underwent cMRI to evaluate for any evidence of infiltrative disease  and to further evaluate the thrombotic organization. Overall lab results were unremarkable. cMRI 10/2021 revealed LVEF 35% with RVEF 51%, possible very small LV apical thrombus, LGE pattern suggestive of prior LAD territory MI, suspect ischemic cardiomyopathy. Dr. Izora Ribashandrasekhar agreed a small thrombus was present, started on Eliquis 5 mg BID.   At last visit, was overall doing well from a cardiac perspective.  Today he presents for follow-up.  He is doing well.  No longer on Eliquis, and is just taking  aspirin 81 mg daily.  Tolerating his medications well.  Overall blood pressure is very well-controlled.  Asymptomatic. Denies any chest pain, shortness of breath, palpitations, syncope, presyncope, dizziness, orthopnea, PND, swelling or significant weight changes, acute bleeding, or claudication.   SH: Officially retired. Likes doing yard work and working on his car.   Past Medical History:  Diagnosis Date   Allergy    ASCVD (arteriosclerotic cardiovascular disease)    Diabetes mellitus without complication    prediabetes   HFrEF (heart failure with reduced ejection fraction)    Hyperlipidemia    Hypertension    Left ventricular apical thrombus    Myocardial infarction 04/20/1999   Obesity (BMI 30-39.9)    OSA (obstructive sleep apnea) 11/02/2015    Past Surgical History:  Procedure Laterality Date   CARDIAC CATHETERIZATION N/A 05/19/2015   Procedure: Right/Left Heart Cath and Coronary Angiography;  Surgeon: Lyn RecordsHenry W Smith, MD;  [facility];  Service: Cardiovascular;  Laterality: N/A;   CORONARY STENT PLACEMENT  04/18/1999   RIGHT/LEFT HEART CATH AND CORONARY ANGIOGRAPHY N/A 06/21/2021   Procedure: RIGHT/LEFT HEART CATH AND CORONARY ANGIOGRAPHY;  Surgeon: Orbie Pyohukkani, Arun K, MD;  Location: [facility] INVASIVE CV LAB;  Service: Cardiovascular;  Laterality: N/A;    Current Medications: Current Meds  Medication Sig   acetaminophen (TYLENOL) 500 MG tablet Take 1,000 mg by mouth every 6 (six) hours as needed for moderate pain.   Aspirin EC 81 mg tablet Take 1 tablet (81 mg total) by mouth daily.  atorvastatin (LIPITOR) 80 MG tablet TAKE ONE TABLET BY MOUTH EVERY EVENING AT 6PM   Cholecalciferol (VITAMIN D) 2000 UNITS CAPS Take 1 capsule by mouth daily.    dapagliflozin propanediol (FARXIGA) 10 MG TABS tablet TAKE 1 TABLET BY MOUTH DAILY BEFORE BREAKFAST   sacubitril-valsartan (ENTRESTO) 49-51MG  TAKE 1 TABLET BY MOUTH TWICE A DAY   spironolactone (ALDACTONE) 25 MG tablet Take 0.5  tablets (12.5 mg total) by mouth daily.   vitamin B-12 (CYANOCOBALAMIN) 1000 MCG tablet Take 1,000 mcg by mouth daily.     Allergies:   Penicillins   Social History   Socioeconomic History   Marital status: Married    Spouse name: [name]   Number of children: 2   Years of education: Not on file   Highest education level: Not on file  Occupational History   Not on file  Tobacco Use   Smoking status: Former    Packs/day: 0.50    Years: 8.00    Additional pack years: 0.00    Total pack years: 4.00    Types: Cigarettes    Start date: 07/24/1966    Quit date: 07/24/1974    Years since quitting: 47.7    Passive exposure: Past   Smokeless tobacco: Never  Vaping Use   Vaping Use: Never used  Substance and Sexual Activity   Alcohol use: No    Alcohol/week: 0.0 standard drinks of alcohol   Drug use: No   Sexual activity: Yes    Birth control/protection: None  Other Topics Concern   Not on file  Social History Narrative   Pt currently works at [employer].    Social Determinants of Health   Financial Resource Strain: Low Risk  (03/09/2021)   Overall Financial Resource Strain (CARDIA)    Difficulty of Paying Living Expenses: Not hard at all  Food Insecurity: No Food Insecurity (03/09/2021)   Hunger Vital Sign    Worried About Running Out of Food in the Last Year: Never true    Ran Out of Food in the Last Year: Never true  Transportation Needs: No Transportation Needs (03/09/2021)   PRAPARE - Administrator, Civil Service (Medical): No    Lack of Transportation (Non-Medical): No  Physical Activity: Inactive (03/09/2021)   Exercise Vital Sign    Days of Exercise per Week: 0 days    Minutes of Exercise per Session: 0 min  Stress: No Stress Concern Present (03/09/2021)   [employer] of Occupational Health - Occupational Stress Questionnaire    Feeling of Stress : Not at all  Social Connections: Moderately Isolated (03/09/2021)   Social Connection and Isolation  Panel [NHANES]    Frequency of Communication with Friends and Family: More than three times a week    Frequency of Social Gatherings with Friends and Family: More than three times a week    Attends Religious Services: Never    Database administrator or Organizations: No    Attends Banker Meetings: Never    Marital Status: Married     Family History: The patient's family history includes AAA (abdominal aortic aneurysm) in his father.  ROS:      Please see the history of present illness.    All other systems reviewed and are negative.  EKGs/Labs/Other Studies Reviewed:    The following studies were reviewed today:  Limited echo 01/2022:  1. Left ventricular ejection fraction, by estimation, is 35%. The left  ventricle has moderately decreased function. The left ventricle  demonstrates global hypokinesis. The left ventricular internal cavity size  was moderately dilated. No evidence of  apical thrombus   2. Right ventricular systolic function is normal. The right ventricular  size is normal.   3. Limited echo evaluate LV function   Comparison(s): Echocardiogram done 05/24/21 showed an EF of 25-30%.    Cardiac MRI on October 26, 2021: IMPRESSION: 1. Mildly dilated LV with wall motion abnormalities as noted above. LV EF 35%.   2.  Normal RV size and systolic function, EF 51%.   3.  Possible very small LV apical thrombus.   4. LGE pattern suggestive of prior LAD territory MI, suspect ischemic cardiomyopathy.    Right and left heart cath on June 21, 2021:  Dist Cx lesion is 50% stenosed.   Prox LAD lesion is 20% stenosed.   Mid LAD lesion is 20% stenosed.   LV end diastolic pressure is normal.   1.  Patent proximal LAD stent with mild in-stent restenosis and mild disease elsewhere. 2.  LVEDP of 18 mmHg. 3.  Cardiac output of 8.46 L/min and cardiac index of 3.6 L/min/m by Fick. 4.  Relatively normal filling pressures with a mean RA of 2 mmHg, mean wedge  of 9 mmHg, mean PA of 19 mmHg, with normal pulmonary artery pulsatility index.   Recommendation: Goal-directed medical therapy for nonischemic cardiomyopathy.    Limited echo on May 24, 2021:  1. Limited study.   2. Left ventricular ejection fraction, by estimation, is 25 to 30%. The  left ventricle has severely decreased function. The left ventricle  demonstrates regional wall motion abnormalities (see scoring  diagram/findings for description). The left  ventricular internal cavity size was mildly dilated.   3. Definity contrast shows slow flow and loosely organized thrombotic  material at LV apex, although no clearly formed mural thrombus.   4. Right ventricular systolic function is normal. The right ventricular  size is normal.   Comparison(s): Prior images reviewed side by side. LVEF decreased relative  to prior study.  Right and left heart cath on May 19, 2015: Prox RCA to Mid RCA lesion, 20% stenosed. Ost LAD to Prox LAD lesion, 30% stenosed. The lesion was previously treated with a stent (unknown type). Dist Cx lesion, 50% stenosed.   Severe anterior apical hypokinesis. Estimated ejection fraction 35%. Normal left ventricular hemodynamics with EDP of 14 mmHg and mean capillary wedge 17 mmHg. Widely patent coronary arteries with 30% diffuse ISR in the proximal LAD stent. There is 50% eccentric stenosis in the distal circumflex. Luminal irregularities are noted in the right coronary.   RECOMMENDATIONS:   Aggressive risk factor modification to prevent progression of CAD Aggressive heart failure therapy to minimize progression of LV dysfunction.  Recent Labs: 06/26/2021: ALT 20 11/22/2021: BUN 13; Creatinine, Ser 1.05; Hemoglobin 16.3; Platelets 246; Potassium 3.6; Sodium 138  Recent Lipid Panel    Component Value Date/Time   CHOL 131 03/17/2021 0920   TRIG 99 03/17/2021 0920   HDL 51 03/17/2021 0920   CHOLHDL 2.6 03/17/2021 0920   VLDL 29 04/27/2016 0822   LDLCALC 62  03/17/2021 0920    Physical Exam:    VS:  BP 102/68   Pulse 62   Ht 5\' 10"  (1.778 m)   Wt 271 lb 12.8 oz (123.3 kg)   SpO2 96%   BMI 39.00 kg/m     Wt Readings from Last 3 Encounters:  04/09/22 271 lb 12.8 oz (123.3 kg)  01/08/22 262 lb 12.8 oz (119.2  kg)  11/20/21 260 lb 3.2 oz (118 kg)     GEN: Obese, 72 y.o. male in no acute distress HEENT: Normal NECK: No JVD; No carotid bruits CARDIAC: S1/S2, RRR, no murmurs, rubs, gallops; 2+ peripheral pulses throughout, strong and equal bilaterally, varicose veins of bilateral lower extremities RESPIRATORY:  Clear to auscultation without rales, wheezing or rhonchi  MUSCULOSKELETAL:  No edema; No deformity  SKIN: Warm and dry NEUROLOGIC:  Alert and oriented x 3 PSYCHIATRIC:  Normal, pleasant affect   ASSESSMENT:    1. Chronic HFrEF (heart failure with reduced ejection fraction)   2. Left ventricular apical thrombus   3. Coronary artery disease involving native heart without angina pectoris, unspecified vessel or lesion type   4. Hyperlipidemia, unspecified hyperlipidemia type   5. Essential hypertension, benign   6. Obesity (BMI 30-39.9)   7. OSA on CPAP      PLAN:    In order of problems listed above:  Chronic HFrEF cMRI revealed LVEF 35%.  Previous right and left heart cath in June 2023 revealed relatively normal filling pressures.  Limited echo 01/2022 revealed EF 35%.  Euvolemic and well compensated on exam. Low sodium diet, fluid restriction <2L, and daily weights encouraged. Educated to contact our office for weight gain of 2 lbs overnight or 5 lbs in one week.  Continue Clifton Custard, and spironolactone.  Previously stopped carvedilol for bradycardia.  Heart healthy diet and regular cardiovascular exercise as tolerated encouraged.  Left ventricular apical thrombus - resolved Limited echo 01/2022 revealed resolution of apical thrombus.  He is on appropriate dosage of Eliquis.  Stopped Eliquis according to instructions,  tolerating aspirin 81 mg daily well.  No change in medication regimen.  3. CAD, status post remote MI with PCI/DES to LAD in 2001 Stable with no anginal symptoms. No indication for ischemic evaluation.  Right and left heart cath 06/2021 revealed patent proximal LAD stent with mild in-stent restenosis and mild disease elsewhere.  Medical therapy was recommended.  We will continue medical therapy at this time and will continue Lipitor. Heart healthy diet and regular cardiovascular exercise encouraged. ED precautions discussed.   Hyperlipidemia Past lipid panel revealed total cholesterol 131, HDL 51, triglycerides 99, and LDL 62.  Continue Lipitor. Heart healthy diet and regular cardiovascular exercise encouraged.   Hypertension Blood pressure today low normal, normal for him and to fluctuate a little higher per his report.  BP well-controlled at home.  No change in medication therapy at this time.  Heart healthy diet and regular cardiovascular exercise encouraged. Discussed to monitor BP at home at least 2 hours after medications and sitting for 5-10 minutes.  Previously recommended to obtain OMRON cuff.   Obesity BMI today 39.00. Weight loss via diet and exercise encouraged. Discussed the impact being overweight would have on cardiovascular risk. Heart healthy diet and regular cardiovascular exercise encouraged.  Discussed PREP program referral, and pt declines at this time.  OSA on CPAP Continued compliance encouraged.     9. Disposition: Follow up with me or APP in 3 months or sooner if anything changes.  Medication Adjustments/Labs and Tests Ordered: Current medicines are reviewed at length with the patient today.  Concerns regarding medicines are outlined above.  No orders of the defined types were placed in this encounter.  No orders of the defined types were placed in this encounter.   Patient Instructions  Medication Instructions:  Your physician has recommended you make the  following change in your medication:  STOP eliquis  Continue all other medications as directed  Labwork: none  Testing/Procedures: none  Follow-Up:  Your physician recommends that you schedule a follow-up appointment in: 3 months  Any Other Special Instructions Will Be Listed Below (If Applicable).  If you need a refill on your cardiac medications before your next appointment, please call your pharmacy.    SignedSharlene Dory, NP  04/09/2022 9:54 AM    [facility]

## 2022-04-09 NOTE — Patient Instructions (Signed)
Medication Instructions:  Your physician has recommended you make the following change in your medication:  STOP eliquis Continue all other medications as directed  Labwork: none  Testing/Procedures: none  Follow-Up:  Your physician recommends that you schedule a follow-up appointment in: 3 months  Any Other Special Instructions Will Be Listed Below (If Applicable).  If you need a refill on your cardiac medications before your next appointment, please call your pharmacy.

## 2022-05-20 ENCOUNTER — Other Ambulatory Visit: Payer: Self-pay | Admitting: Nurse Practitioner

## 2022-05-20 DIAGNOSIS — I5022 Chronic systolic (congestive) heart failure: Secondary | ICD-10-CM

## 2022-06-26 DIAGNOSIS — G4733 Obstructive sleep apnea (adult) (pediatric): Secondary | ICD-10-CM | POA: Diagnosis not present

## 2022-06-29 DIAGNOSIS — E1169 Type 2 diabetes mellitus with other specified complication: Secondary | ICD-10-CM | POA: Diagnosis not present

## 2022-07-09 ENCOUNTER — Encounter: Payer: Self-pay | Admitting: Nurse Practitioner

## 2022-07-09 ENCOUNTER — Ambulatory Visit: Payer: Medicare PPO | Attending: Nurse Practitioner | Admitting: Nurse Practitioner

## 2022-07-09 VITALS — BP 114/70 | HR 56 | Ht 70.0 in | Wt 244.6 lb

## 2022-07-09 DIAGNOSIS — I2510 Atherosclerotic heart disease of native coronary artery without angina pectoris: Secondary | ICD-10-CM

## 2022-07-09 DIAGNOSIS — G4733 Obstructive sleep apnea (adult) (pediatric): Secondary | ICD-10-CM | POA: Diagnosis not present

## 2022-07-09 DIAGNOSIS — E669 Obesity, unspecified: Secondary | ICD-10-CM | POA: Diagnosis not present

## 2022-07-09 DIAGNOSIS — I5022 Chronic systolic (congestive) heart failure: Secondary | ICD-10-CM

## 2022-07-09 DIAGNOSIS — I10 Essential (primary) hypertension: Secondary | ICD-10-CM | POA: Diagnosis not present

## 2022-07-09 DIAGNOSIS — E785 Hyperlipidemia, unspecified: Secondary | ICD-10-CM

## 2022-07-09 NOTE — Progress Notes (Signed)
Cardiology Office Note:  .   Date:  07/09/2022  ID:  Benjamin [name], DOB [birth year], MRN [id number] PCP: Joycelyn Rua, MD  [facility] Providers Cardiologist:  Dina Rich, MD    History of Present Illness: .   Benjamin [name] is a 72 y.o. male with a PMH of CAD, HFrEF, NICM, HTN, HLD, T2DM, OSA, past hx of LV apical thrombus (resolved), and obesity, who presents today for follow-up.   I last saw patient on April 09, 2022. Was doing well.   Today he presents for follow-up. He states he is doing well. Denies any chest pain, shortness of breath, palpitations, syncope, presyncope, dizziness, orthopnea, PND, swelling or significant weight changes, acute bleeding, or claudication.  Compliant with his medications and tolerating well.  Has had labs done with Dr. Joycelyn Rua (PCP), says his A1c has significantly improved.  SH: Retired Data processing manager. Likes doing yard work and working on his car.   Studies Reviewed: .       Limited echo 01/2022: 1. Left ventricular ejection fraction, by estimation, is 35%. The left  ventricle has moderately decreased function. The left ventricle  demonstrates global hypokinesis. The left ventricular internal cavity size  was moderately dilated. [name] evidence of  apical thrombus   2. Right ventricular systolic function is normal. The right ventricular  size is normal.   3. Limited echo evaluate LV function   Comparison(s): Echocardiogram done 05/24/21 showed an EF of 25-30%.      Physical Exam:   VS:  BP 114/70   Pulse (!) 56   Ht 5\' 10"  (1.778 m)   Wt 244 lb 9.6 oz (110.9 kg)   SpO2 96%   BMI 35.10 kg/m    Wt Readings from Last 3 Encounters:  07/09/22 244 lb 9.6 oz (110.9 kg)  04/09/22 271 lb 12.8 oz (123.3 kg)  01/08/22 262 lb 12.8 oz (119.2 kg)    GEN: Obese, 72 year old male in [name] acute distress NECK: [name] JVD; [name] carotid bruits CARDIAC: S1/S2, slow rate and regular rhythm, [name] murmurs, rubs, gallops RESPIRATORY:  Clear to  auscultation without rales, wheezing or rhonchi  ABDOMEN: Soft, non-tender, non-distended EXTREMITIES:  [name] edema; [name] deformity   ASSESSMENT AND PLAN: .    Chronic HFrEF Stage C, NYHA class I symptoms.  cMRI revealed LVEF 35%.  Previous right and left heart cath in June 2023 revealed relatively normal filling pressures.  Limited echo 01/2022 revealed EF 35%.  Euvolemic and well compensated on exam. Low sodium diet, fluid restriction <2L, and daily weights encouraged. Educated to contact our office for weight gain of 2 lbs overnight or 5 lbs in one week.  Continue Clifton Custard, and spironolactone. Was unable to tolerate carvedilol d/t bradycardia.  Heart healthy diet and regular cardiovascular exercise as tolerated encouraged.   2. CAD, status post remote MI with PCI/DES to LAD in 2001 Stable with [name] anginal symptoms. [name] indication for ischemic evaluation.  Right and left heart cath 06/2021 revealed patent proximal LAD stent with mild in-stent restenosis and mild disease elsewhere.  Medical therapy recommended.  Continue current medication regimen.  Heart healthy diet and regular cardiovascular exercise encouraged.    Hyperlipidemia Will request most recent labs from PCP's office. Continue Lipitor. Heart healthy diet and regular cardiovascular exercise encouraged.    Hypertension Blood pressure today low normal, normal for him and to fluctuate a little higher per his report.  BP well-controlled at home.  [name] change in medication therapy at this time.  Heart  healthy diet and regular cardiovascular exercise encouraged. Discussed to monitor BP at home at least 2 hours after medications and sitting for 5-10 minutes.  Previously recommended to obtain OMRON cuff.    Obesity  Weight loss via diet and exercise encouraged. Discussed the impact being overweight would have on cardiovascular risk. Heart healthy diet and regular cardiovascular exercise encouraged.     OSA on CPAP Continued compliance  encouraged.     Dispo: Follow-up with Dr. Dina Rich or APP in 6 months or sooner if anything changes.  Signed, Sharlene Dory, NP

## 2022-07-09 NOTE — Patient Instructions (Addendum)
Medication Instructions:  Your physician recommends that you continue on your current medications as directed. Please refer to the Current Medication list given to you today.  Labwork: Requesting Labs from PCP  Testing/Procedures: none  Follow-Up: Your physician recommends that you schedule a follow-up appointment in: 6 Months with Branch  Any Other Special Instructions Will Be Listed Below (If Applicable).  If you need a refill on your cardiac medications before your next appointment, please call your pharmacy.

## 2022-07-23 DIAGNOSIS — E7800 Pure hypercholesterolemia, unspecified: Secondary | ICD-10-CM | POA: Diagnosis not present

## 2022-07-23 DIAGNOSIS — I2510 Atherosclerotic heart disease of native coronary artery without angina pectoris: Secondary | ICD-10-CM | POA: Diagnosis not present

## 2022-07-23 DIAGNOSIS — E1142 Type 2 diabetes mellitus with diabetic polyneuropathy: Secondary | ICD-10-CM | POA: Diagnosis not present

## 2022-07-23 DIAGNOSIS — I110 Hypertensive heart disease with heart failure: Secondary | ICD-10-CM | POA: Diagnosis not present

## 2022-07-23 DIAGNOSIS — Z0000 Encounter for general adult medical examination without abnormal findings: Secondary | ICD-10-CM | POA: Diagnosis not present

## 2022-07-23 DIAGNOSIS — I5022 Chronic systolic (congestive) heart failure: Secondary | ICD-10-CM | POA: Diagnosis not present

## 2022-09-26 DIAGNOSIS — G4733 Obstructive sleep apnea (adult) (pediatric): Secondary | ICD-10-CM | POA: Diagnosis not present

## 2022-09-28 ENCOUNTER — Other Ambulatory Visit: Payer: Self-pay | Admitting: Nurse Practitioner

## 2022-11-12 ENCOUNTER — Other Ambulatory Visit: Payer: Self-pay | Admitting: Nurse Practitioner

## 2022-11-12 DIAGNOSIS — I5022 Chronic systolic (congestive) heart failure: Secondary | ICD-10-CM

## 2022-12-04 ENCOUNTER — Other Ambulatory Visit: Payer: Self-pay | Admitting: Family Medicine

## 2022-12-06 ENCOUNTER — Other Ambulatory Visit: Payer: Self-pay | Admitting: Family Medicine

## 2022-12-06 ENCOUNTER — Other Ambulatory Visit: Payer: Self-pay

## 2022-12-06 MED ORDER — ATORVASTATIN CALCIUM 80 MG PO TABS: ORAL_TABLET | ORAL | 3 refills | Status: DC

## 2022-12-06 NOTE — Telephone Encounter (Signed)
Requested Prescriptions  Refused Prescriptions Disp Refills   atorvastatin (LIPITOR) 80 MG tablet [Pharmacy Med Name: ATORVASTATIN 80 MG TABLET] 90 tablet 3    Sig: TAKE 1 TABLET BY MOUTH EVERY EVENING AT 6PM     Cardiovascular:  Antilipid - Statins Failed - 12/04/2022  6:25 AM      Failed - Valid encounter within last 12 months    Recent Outpatient Visits           1 year ago Prediabetes   [facility] Donita Brooks, MD   2 years ago Upper respiratory tract infection, unspecified type   [facility] Valentino Nose, NP   2 years ago CAD in native artery   [facility] Pickard, Priscille Heidelberg, MD   2 years ago Idiopathic peripheral neuropathy   [facility] Valentino Nose, NP   3 years ago CAD in native artery   [facility] Pickard, Priscille Heidelberg, MD       Future Appointments             In 1 month Branch, Dorothe Pea, MD [facility] [place], [place]            Failed - Lipid Panel in normal range within the last 12 months    Cholesterol  Date Value Ref Range Status  03/17/2021 131 <200 mg/dL Final   LDL Cholesterol (Calc)  Date Value Ref Range Status  03/17/2021 62 mg/dL (calc) Final    Comment:    Reference range: <100 . Desirable range <100 mg/dL for primary prevention;   <70 mg/dL for patients with CHD or diabetic patients  with > or = 2 CHD risk factors. Marland Kitchen LDL-C is now calculated using the Martin-Hopkins  calculation, which is a validated novel method providing  better accuracy than the Friedewald equation in the  estimation of LDL-C.  Horald Pollen et al. Lenox Ahr. 1324;401(02): 2061-2068  (http://education.[facility].com/faq/FAQ164)    HDL  Date Value Ref Range Status  03/17/2021 51 > OR = 40 mg/dL Final   Triglycerides  Date Value Ref Range Status  03/17/2021 99 <150 mg/dL Final         Passed - Patient is not pregnant

## 2022-12-07 NOTE — Telephone Encounter (Signed)
Pt no longer at this practice and prescriber not at this practice  Requested Prescriptions  Refused Prescriptions Disp Refills   atorvastatin (LIPITOR) 80 MG tablet [Pharmacy Med Name: ATORVASTATIN 80 MG TABLET] 90 tablet 3    Sig: TAKE 1 TABLET BY MOUTH EVERY EVENING AT 6PM     Cardiovascular:  Antilipid - Statins Failed - 12/06/2022  1:09 PM      Failed - Valid encounter within last 12 months    Recent Outpatient Visits           1 year ago Prediabetes   [facility] Tanya Nones, Priscille Heidelberg, MD   2 years ago Upper respiratory tract infection, unspecified type   [facility] Valentino Nose, NP   2 years ago CAD in native artery   [facility] Pickard, Priscille Heidelberg, MD   2 years ago Idiopathic peripheral neuropathy   [facility] Valentino Nose, NP   3 years ago CAD in native artery   [facility] Pickard, Priscille Heidelberg, MD       Future Appointments             In 1 month Branch, Dorothe Pea, MD [facility] [place], [place]            Failed - Lipid Panel in normal range within the last 12 months    Cholesterol  Date Value Ref Range Status  03/17/2021 131 <200 mg/dL Final   LDL Cholesterol (Calc)  Date Value Ref Range Status  03/17/2021 62 mg/dL (calc) Final    Comment:    Reference range: <100 . Desirable range <100 mg/dL for primary prevention;   <70 mg/dL for patients with CHD or diabetic patients  with > or = 2 CHD risk factors. Marland Kitchen LDL-C is now calculated using the Martin-Hopkins  calculation, which is a validated novel method providing  better accuracy than the Friedewald equation in the  estimation of LDL-C.  Horald Pollen et al. Lenox Ahr. 4098;119(14): 2061-2068  (http://education.[facility].com/faq/FAQ164)    HDL  Date Value Ref Range Status  03/17/2021 51 > OR = 40 mg/dL Final   Triglycerides  Date Value Ref Range Status  03/17/2021 99 <150 mg/dL Final          Passed - Patient is not pregnant

## 2022-12-19 ENCOUNTER — Ambulatory Visit: Payer: Medicare Other | Admitting: Family Medicine

## 2022-12-19 ENCOUNTER — Telehealth: Payer: Self-pay

## 2022-12-19 NOTE — Telephone Encounter (Signed)
[name] was scheduled for a visit with Dr. Darlyn Read on 12/19/22.  He is new to our practice and E2C2 scheduled in error as provider is not taking new patients.  Patient came in the office today and spoke with me about this error and was very kind and understanding, but obviously disappointed. He asked if Dr. Darlyn Read would consider taking him on as a patient.  He has heard wonderful things of him and I told him that all I could do at this time is ask Dr. Darlyn Read and see what he would do.  I will notify him once I hear back.

## 2022-12-19 NOTE — Telephone Encounter (Signed)
I appreciate the kind words and the confidence he has placed in me, but currently [facility] is keeping me up to my eyeballs in work. Please give him my apology

## 2022-12-21 ENCOUNTER — Encounter: Payer: Self-pay | Admitting: Family Medicine

## 2022-12-21 NOTE — Telephone Encounter (Signed)
Patient was notified that Dr. Darlyn Read is unable to take on patient.

## 2023-01-06 ENCOUNTER — Other Ambulatory Visit: Payer: Self-pay | Admitting: Physician Assistant

## 2023-01-14 DIAGNOSIS — I5022 Chronic systolic (congestive) heart failure: Secondary | ICD-10-CM | POA: Diagnosis not present

## 2023-01-14 DIAGNOSIS — E1159 Type 2 diabetes mellitus with other circulatory complications: Secondary | ICD-10-CM | POA: Diagnosis not present

## 2023-01-14 DIAGNOSIS — I2510 Atherosclerotic heart disease of native coronary artery without angina pectoris: Secondary | ICD-10-CM | POA: Diagnosis not present

## 2023-01-14 DIAGNOSIS — N401 Enlarged prostate with lower urinary tract symptoms: Secondary | ICD-10-CM | POA: Diagnosis not present

## 2023-01-14 DIAGNOSIS — I10 Essential (primary) hypertension: Secondary | ICD-10-CM | POA: Diagnosis not present

## 2023-01-14 DIAGNOSIS — E7800 Pure hypercholesterolemia, unspecified: Secondary | ICD-10-CM | POA: Diagnosis not present

## 2023-01-23 DIAGNOSIS — R197 Diarrhea, unspecified: Secondary | ICD-10-CM | POA: Diagnosis not present

## 2023-01-23 DIAGNOSIS — Z860100 Personal history of colon polyps, unspecified: Secondary | ICD-10-CM | POA: Diagnosis not present

## 2023-01-23 DIAGNOSIS — R194 Change in bowel habit: Secondary | ICD-10-CM | POA: Diagnosis not present

## 2023-01-24 ENCOUNTER — Encounter: Payer: Self-pay | Admitting: Cardiology

## 2023-01-24 ENCOUNTER — Ambulatory Visit: Payer: Medicare PPO | Attending: Cardiology | Admitting: Cardiology

## 2023-01-24 VITALS — BP 122/74 | HR 60 | Ht 70.0 in | Wt 260.0 lb

## 2023-01-24 DIAGNOSIS — E785 Hyperlipidemia, unspecified: Secondary | ICD-10-CM

## 2023-01-24 DIAGNOSIS — I2510 Atherosclerotic heart disease of native coronary artery without angina pectoris: Secondary | ICD-10-CM

## 2023-01-24 DIAGNOSIS — I5022 Chronic systolic (congestive) heart failure: Secondary | ICD-10-CM | POA: Diagnosis not present

## 2023-01-24 NOTE — Patient Instructions (Signed)
Medication Instructions:  Continue all current medications.   Labwork: none  Testing/Procedures: none  Follow-Up: 6 months   Any Other Special Instructions Will Be Listed Below (If Applicable).   If you need a refill on your cardiac medications before your next appointment, please call your pharmacy.  

## 2023-01-24 NOTE — Progress Notes (Signed)
 Clinical Summary Mr. Thielke is a 73 y.o.male seen today for follow up of the following medical problems.      1. CAD - previous notes mention MI in 2001 at [facility], cannot find cath report. Patient has stent card that shows he received a stent to his LAD at that time.   - repeat cath 05/2015 that showed patent coronaries.  This was down after he was found to have a drop in LVEF 06/2021 RHC/LHC: mild to mod CAD, CI 3.6, PCWP 9, mean PA 19  - no chest pains, no SOB/DOE - compliant with meds. - some orthostatilc symptoms, reports some limitations on oral hydration.    2. Chronic systolic heart failure/NICM -  05/2015 echo showed an LVEF 25-30%, this is a new finding of systolic dysfunction. - 05/2015 cath showed patent coronaries. Normal filling pressures at that time.    - repeat echo 09/2015, LVEF improved to 45-50%. Grade I diasotlic dysfunction.    - coreg  dosing has been limited by low bp's and HRs - 05/2021 echo LVEF 25-30%, indicating recurrent systolic dysfunction - insurance had turned down entresto . Ran out 03/01/21, started losartan  50mg  daily.  - no recent chest pain, no SOB/DOE, no LE edema     06/2021 RHC/LHC: mild to mod CAD, CI 3.6, PCWP 9, mean PA 19 10/2021 cMRI: LVEF 35%, possible very small LV apical thrombus. LGE suggests prior LAD infarct.  Jan 2024 limited echo: LVEF 35% - did not tolerate coreg  due to bradycardia - no SOB/DOE     3. OSA  - followed by Dr Micael Adas, due for f/u - remain compliant with cpap.    4. HLD -04/2017 lipids at goal, compliant with statin.   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  Past Medical History:  Diagnosis Date   Allergy    ASCVD (arteriosclerotic cardiovascular disease)    Diabetes mellitus without complication (HCC)    prediabetes   HFrEF (heart  failure with reduced ejection fraction) (HCC)    Hyperlipidemia    Hypertension    Left ventricular apical thrombus    Myocardial infarction (HCC) 04/20/1999   Obesity (BMI 30-39.9)    OSA (obstructive sleep apnea) 11/02/2015     Allergies  Allergen Reactions   Penicillins Swelling     Current Outpatient Medications  Medication Sig Dispense Refill   acetaminophen  (TYLENOL ) 500 MG tablet Take 1,000 mg by mouth every 6 (six) hours as needed for moderate pain.     aspirin  EC 81 MG tablet Take 1 tablet (81 mg total) by mouth daily. Swallow whole. Start after eliquis  is stopped 90 tablet 3   atorvastatin  (LIPITOR) 80 MG tablet TAKE ONE TABLET BY MOUTH EVERY EVENING AT 6PM 90 tablet 3   Cholecalciferol (VITAMIN D) 2000 UNITS CAPS Take 1 capsule by mouth daily.      FARXIGA  10 MG TABS tablet TAKE 1 TABLET BY MOUTH DAILY BEFORE BREAKFAST 90 tablet 3   sacubitril -valsartan  (ENTRESTO ) 49-51 MG TAKE 1 TABLET BY MOUTH TWICE A DAY 180 tablet 1   spironolactone  (ALDACTONE ) 25 MG tablet TAKE 1/2 TABLET BY MOUTH DAILY 45 tablet 2   vitamin B-12 (CYANOCOBALAMIN ) 1000 MCG tablet Take 1,000 mcg by mouth daily.  No current facility-administered medications for this visit.     Past Surgical History:  Procedure Laterality Date   CARDIAC CATHETERIZATION N/A 05/19/2015   Procedure: Right/Left Heart Cath and Coronary Angiography;  Surgeon: Arty Binning, MD;  Location: [facility] INVASIVE CV LAB;  Service: Cardiovascular;  Laterality: N/A;   CORONARY STENT PLACEMENT  04/18/1999   RIGHT/LEFT HEART CATH AND CORONARY ANGIOGRAPHY N/A 06/21/2021   Procedure: RIGHT/LEFT HEART CATH AND CORONARY ANGIOGRAPHY;  Surgeon: Kyra Phy, MD;  Location: [facility] INVASIVE CV LAB;  Service: Cardiovascular;  Laterality: N/A;     Allergies  Allergen Reactions   Penicillins Swelling      Family History  Problem Relation Age of Onset   AAA (abdominal aortic aneurysm) Father      Social History Mr. Dullea reports  that he quit smoking about 48 years ago. His smoking use included cigarettes. He started smoking about 56 years ago. He has a 4 pack-year smoking history. He has been exposed to tobacco smoke. He has never used smokeless tobacco. Mr. Waymire reports no history of alcohol use.   Physical Examination Today's Vitals   01/24/23 1340  BP: 122/74  Pulse: 60  SpO2: 96%  Weight: 260 lb (117.9 kg)  Height: 5\' 10"  (1.778 m)   Body mass index is 37.31 kg/m.  Gen: resting comfortably, no acute distress HEENT: no scleral icterus, pupils equal round and reactive, no palptable cervical adenopathy,  CV: RRR, no mrg, no jvd Resp: Clear to auscultation bilaterally GI: abdomen is soft, non-tender, non-distended, normal bowel sounds, no hepatosplenomegaly MSK: extremities are warm, no edema.  Skin: warm, no rash Neuro:  no focal deficits Psych: appropriate affect   Diagnostic Studies 05/2015 echo Study Conclusions   - Left ventricle: The cavity size was normal. Wall thickness was   normal. Systolic function was severely reduced. The estimated   ejection fraction was in the range of 25% to 30%. Diffuse   hypokinesis. Doppler parameters are consistent with abnormal left   ventricular relaxation (grade 1 diastolic dysfunction). - Regional wall motion abnormality: Akinesis of the mid anterior,   basal-mid anteroseptal, and apical myocardium. - Aortic valve: Mildly calcified annulus. Trileaflet; mildly   thickened leaflets. Valve area (VTI): 2.42 cm^2. Valve area   (Vmax): 2.32 cm^2. - Atrial septum: No defect or patent foramen ovale was identified. - Technically difficult study. Echocontrast was used to enhance   visualization.         05/2015 cath Prox RCA to Mid RCA lesion, 20% stenosed. Ost LAD to Prox LAD lesion, 30% stenosed. The lesion was previously treated with a stent (unknown type). Dist Cx lesion, 50% stenosed.   Severe anterior apical hypokinesis. Estimated ejection fraction  35%. Normal left ventricular hemodynamics with EDP of 14 mmHg and mean capillary wedge 17 mmHg. Widely patent coronary arteries with 30% diffuse ISR in the proximal LAD stent. There is 50% eccentric stenosis in the distal circumflex. Luminal irregularities are noted in the right coronary.   Post cath RECOMMENDATIONS:   Aggressive risk factor modification to prevent progression of CAD Aggressive heart failure therapy to minimize progression of LV dysfunction.     09/2015 echo Study Conclusions   - Left ventricle: The cavity size was normal. Wall thickness was   normal. Systolic function was mildly reduced. The estimated   ejection fraction was in the range of 45% to 50%. Doppler   parameters are consistent with abnormal left ventricular   relaxation (grade 1 diastolic dysfunction). - Regional wall motion  abnormality: Hypokinesis of the mid   anterior, mid anteroseptal, and apical myocardium. - Aortic valve: Mildly calcified annulus. Trileaflet; mildly   thickened leaflets. Valve area (VTI): 3.07 cm^2. Valve area   (Vmax): 2.7 cm^2. Valve area (Vmean): 2.7 cm^2. - Technically difficult study.     04/2021 echo IMPRESSIONS     1. LV endocardium poorly visualized. Roughly LVEF is in the 40-50% range.  Recommend limited study with echocontrast for more precise estimate. .  Left ventricular ejection fraction, by estimation, is 40-50%. The left  ventricle has mildly decreased to low   normal function. Left ventricular endocardial border not optimally  defined to evaluate regional wall motion. Left ventricular diastolic  parameters are consistent with Grade I diastolic dysfunction (impaired  relaxation).   2. Right ventricular systolic function is normal. The right ventricular  size is normal. Tricuspid regurgitation signal is inadequate for assessing  PA pressure.   3. The mitral valve was not well visualized. No evidence of mitral valve  regurgitation. No evidence of mitral stenosis.    4. The aortic valve is tricuspid. Aortic valve regurgitation is not  visualized. No aortic stenosis is present.   5. The inferior vena cava is normal in size with greater than 50%  respiratory variability, suggesting right atrial pressure of 3 mmHg.   Comparison(s): LVEF 45-50%.    05/2021 limited echo 1. Limited study.   2. Left ventricular ejection fraction, by estimation, is 25 to 30%. The  left ventricle has severely decreased function. The left ventricle  demonstrates regional wall motion abnormalities (see scoring  diagram/findings for description). The left  ventricular internal cavity size was mildly dilated.   3. Definity  contrast shows slow flow and loosely organized thrombotic  material at LV apex, although no clearly formed mural thrombus.   4. Right ventricular systolic function is normal. The right ventricular  size is normal.     06/2021 RHC/LHC Dist Cx lesion is 50% stenosed.   Prox LAD lesion is 20% stenosed.   Mid LAD lesion is 20% stenosed.   LV end diastolic pressure is normal.   1.  Patent proximal LAD stent with mild in-stent restenosis and mild disease elsewhere. 2.  LVEDP of 18 mmHg. 3.  Cardiac output of 8.46 L/min and cardiac index of 3.6 L/min/m by Fick. 4.  Relatively normal filling pressures with a mean RA of 2 mmHg, mean wedge of 9 mmHg, mean PA of 19 mmHg, with normal pulmonary artery pulsatility index.   Recommendation: Goal-directed medical therapy for nonischemic cardiomyopathy.     Assessment and Plan   1.Chronic HFrEF - recurrent LV dysfunction, previously had normalized. Most recent echo shows LVEF back down to 25-30% - cath shows no new signifcant CAD - no symptoms, continue current meds     2. CAD -no recent symptoms, continue current meds   3. HLD - f/u labs  Laurann Pollock, M.D.

## 2023-02-12 DIAGNOSIS — G4733 Obstructive sleep apnea (adult) (pediatric): Secondary | ICD-10-CM | POA: Diagnosis not present

## 2023-03-22 ENCOUNTER — Ambulatory Visit: Payer: PRIVATE HEALTH INSURANCE | Admitting: Family Medicine

## 2023-04-03 ENCOUNTER — Encounter: Payer: Self-pay | Admitting: Cardiology

## 2023-04-03 ENCOUNTER — Ambulatory Visit: Payer: PRIVATE HEALTH INSURANCE | Attending: Cardiology | Admitting: Cardiology

## 2023-04-03 VITALS — BP 106/68 | HR 58 | Resp 16 | Ht 70.0 in | Wt 255.8 lb

## 2023-04-03 DIAGNOSIS — G4733 Obstructive sleep apnea (adult) (pediatric): Secondary | ICD-10-CM | POA: Diagnosis not present

## 2023-04-03 DIAGNOSIS — I10 Essential (primary) hypertension: Secondary | ICD-10-CM

## 2023-04-03 NOTE — Patient Instructions (Signed)
 Medication Instructions:  Your physician recommends that you continue on your current medications as directed. Please refer to the Current Medication list given to you today.  *If you need a refill on your cardiac medications before your next appointment, please call your pharmacy*  Follow-Up: At [facility], you and your health needs are our priority.  As part of our continuing mission to provide you with exceptional heart care, our providers are all part of one team.  This team includes your primary Cardiologist (physician) and Advanced Practice Providers or APPs (Physician Assistants and Nurse Practitioners) who all work together to provide you with the care you need, when you need it.  Your next appointment:   1 year(s)  Provider:   Armanda Magic, MD  We recommend signing up for the patient portal called "MyChart".  Sign up information is provided on this After Visit Summary.  MyChart is used to connect with patients for Virtual Visits (Telemedicine).  Patients are able to view lab/test results, encounter notes, upcoming appointments, etc.  Non-urgent messages can be sent to your provider as well.   To learn more about what you can do with MyChart, go to [email or web address].   Other Instructions      1st Floor: - Lobby - Registration  - Pharmacy  - Lab - Cafe  2nd Floor: - PV Lab - Diagnostic Testing (echo, CT, nuclear med)  3rd Floor: - Vacant  4th Floor: - TCTS (cardiothoracic surgery) - AFib Clinic - Structural Heart Clinic - Vascular Surgery  - Vascular Ultrasound  5th Floor: - HeartCare Cardiology (general and EP) - Clinical Pharmacy for coumadin, hypertension, lipid, weight-loss medications, and med management appointments    Valet parking services will be available as well.

## 2023-04-03 NOTE — Addendum Note (Signed)
 Addended by: Armanda Magic R on: 04/03/2023 09:04 AM   Modules accepted: Level of Service

## 2023-04-03 NOTE — Progress Notes (Addendum)
 Sleep Medicine CONSULT Note    Date:  04/03/2023   ID:  Benjamin [name], DOB Apr 18, 1950, MRN [id number]  PCP:  Benjamin Rua, MD  Cardiologist: Benjamin Rich, MD   Chief Complaint  Patient presents with   Sleep Apnea        New Patient (Initial Visit)    History of Present Illness:  Benjamin [name] is a 73 y.o. male who is being seen today for the evaluation of obstructive sleep apnea at the request of Benjamin Rich, MD.  This is a 73 year old male with a history of CAD, diabetes mellitus, HFrEF, hyperlipidemia, hypertension and obstructive sleep apnea.  He was diagnosed with OSA more than 10 10 years ago and was on CPAP for 6-8 months and stopped it due to discomfort with the mask.  He subsequently restarted the device and was seen by me.  I repeated a sleep study and he has severe OSA with an AHI of 48/hr with oxygen desaturations as low as 78%.  He is now on CPAP at 16cm H2O.    He was then lost to follow-up and is now referred back for sleep medicine consultation to reestablish sleep care  He is doing well with his PAP device and thinks that he has gotten used to it.  He tolerates the nasal cushion mask which he loves.  He feels the pressure is adequate.  He does feel sleepy some when he wakes up in the am.  He does not get sleepy during the day and does not have to nap.  He denies any significant mouth or nasal dryness or nasal congestion.  He does not think that he snores.    Past Medical History:  Diagnosis Date   Allergy    ASCVD (arteriosclerotic cardiovascular disease)    Diabetes mellitus without complication (HCC)    prediabetes   HFrEF (heart failure with reduced ejection fraction) (HCC)    Hyperlipidemia    Hypertension    Left ventricular apical thrombus    Myocardial infarction (HCC) 04/20/1999   Obesity (BMI 30-39.9)    OSA (obstructive sleep apnea) 11/02/2015    Past Surgical History:  Procedure Laterality Date   CARDIAC CATHETERIZATION N/A 05/19/2015    Procedure: Right/Left Heart Cath and Coronary Angiography;  Surgeon: Lyn Records, MD;  Location: [facility] INVASIVE CV LAB;  Service: Cardiovascular;  Laterality: N/A;   CORONARY STENT PLACEMENT  04/18/1999   RIGHT/LEFT HEART CATH AND CORONARY ANGIOGRAPHY N/A 06/21/2021   Procedure: RIGHT/LEFT HEART CATH AND CORONARY ANGIOGRAPHY;  Surgeon: Orbie Pyo, MD;  Location: [facility] INVASIVE CV LAB;  Service: Cardiovascular;  Laterality: N/A;    Current Medications: Current Meds  Medication Sig   acetaminophen (TYLENOL) 500 MG tablet Take 1,000 mg by mouth every 6 (six) hours as needed for moderate pain.   aspirin EC 81 MG tablet Take 1 tablet (81 mg total) by mouth daily. Swallow whole. Start after eliquis is stopped   atorvastatin (LIPITOR) 80 MG tablet TAKE ONE TABLET BY MOUTH EVERY EVENING AT 6PM   cholecalciferol (VITAMIN D3) 25 MCG (1000 UNIT) tablet Take 1,000 Units by mouth daily.   FARXIGA 10 MG TABS tablet TAKE 1 TABLET BY MOUTH DAILY BEFORE BREAKFAST   sacubitril-valsartan (ENTRESTO) 49-51 MG TAKE 1 TABLET BY MOUTH TWICE A DAY   spironolactone (ALDACTONE) 25 MG tablet TAKE 1/2 TABLET BY MOUTH DAILY   vitamin B-12 (CYANOCOBALAMIN) 1000 MCG tablet Take 1,000 mcg by mouth daily.    Allergies:   Penicillins  Social History   Socioeconomic History   Marital status: Married    Spouse name: Benjamin [name]   Number of children: 2   Years of education: Not on file   Highest education level: 12th grade  Occupational History   Not on file  Tobacco Use   Smoking status: Former    Current packs/day: 0.00    Average packs/day: 0.5 packs/day for 8.0 years (4.0 ttl pk-yrs)    Types: Cigarettes    Start date: 07/24/1966    Quit date: 07/24/1974    Years since quitting: 48.7    Passive exposure: Past   Smokeless tobacco: Never  Vaping Use   Vaping status: Never Used  Substance and Sexual Activity   Alcohol use: [name]    Alcohol/week: 0.0 standard drinks of alcohol   Drug use: [name]   Sexual activity: Yes     Birth control/protection: None  Other Topics Concern   Not on file  Social History Narrative   Pt currently works at [employer].    Social Drivers of Corporate investment banker Strain: Low Risk  (12/12/2022)   Overall Financial Resource Strain (CARDIA)    Difficulty of Paying Living Expenses: Not very hard  Food Insecurity: [name] Food Insecurity (12/12/2022)   Hunger Vital Sign    Worried About Running Out of Food in the Last Year: Never true    Ran Out of Food in the Last Year: Never true  Transportation Needs: [name] Transportation Needs (12/12/2022)   PRAPARE - Administrator, Civil Service (Medical): [name]    Lack of Transportation (Non-Medical): [name]  Physical Activity: Insufficiently Active (12/12/2022)   Exercise Vital Sign    Days of Exercise per Week: 2 days    Minutes of Exercise per Session: 30 min  Stress: [name] Stress Concern Present (12/12/2022)   [employer] of Occupational Health - Occupational Stress Questionnaire    Feeling of Stress : Only a little  Social Connections: Moderately Integrated (12/12/2022)   Social Connection and Isolation Panel [NHANES]    Frequency of Communication with Friends and Family: Twice a week    Frequency of Social Gatherings with Friends and Family: Once a week    Attends Religious Services: 1 to 4 times per year    Active Member of [employer] or Organizations: [name]    Attends Engineer, structural: Not on file    Marital Status: Married     Family History:  The patient's family history includes AAA (abdominal aortic aneurysm) in his father.   ROS:   Please see the history of present illness.    ROS All other systems reviewed and are negative.      [name] data to display             PHYSICAL EXAM:   VS:  BP 106/68 (BP Location: Left Arm, Patient Position: Sitting, Cuff Size: Large)   Pulse (!) 58   Resp 16   Ht 5\' 10"  (1.778 m)   Wt 255 lb 12.8 oz (116 kg)   SpO2 97%   BMI 36.70 kg/m    GEN: Well  nourished, well developed, in [name] acute distress  HEENT: normal  Neck: [name] JVD, carotid bruits, or masses Cardiac: RRR; [name] murmurs, rubs, or gallops,[name] edema.  Intact distal pulses bilaterally.  Respiratory:  clear to auscultation bilaterally, normal work of breathing GI: soft, nontender, nondistended, + BS MS: [name] deformity or atrophy  Skin: warm and dry, [name] rash Neuro:  Alert and  Oriented x 3, Strength and sensation are intact Psych: euthymic mood, full affect  Wt Readings from Last 3 Encounters:  04/03/23 255 lb 12.8 oz (116 kg)  01/24/23 260 lb (117.9 kg)  07/09/22 244 lb 9.6 oz (110.9 kg)      Studies/Labs Reviewed:   Home sleep study, CPAP titration, PAP compliance download  Recent Labs: [name] results found for requested labs within last 365 days.      ASSESSMENT:    1. OSA on CPAP   2. Essential hypertension, benign      PLAN:  In order of problems listed above:   OSA - The patient is tolerating PAP therapy well without any problems. The PAP download performed by his DME was personally reviewed and interpreted by me today and showed an AHI of 12/hr on 14 cm H2O with 93% compliance in using more than 4 hours nightly.  The patient has been using and benefiting from PAP use and will continue to benefit from therapy.  -It does look like his mask has a significant leak -he uses a nasal cushion mask with [name] chin strap and I suspect that he is breathing through his mouth some -I will get a chin strap and repeat a download in 4 weeks -we discussed the Inspire device which he would qualify for based on AHI but his BMI is too high at 37  Hypertension -BP controlled on exam today -Continue prescription drug management with Entresto 49-51 mg twice daily, spironolactone 25 mg 1/2 tablet daily with as needed refills  Followup with me in 1 year  Time Spent: 20 minutes total time of encounter, including 15 minutes spent in face-to-face patient care on the date of this encounter.  This time includes coordination of care and counseling regarding above mentioned problem list. Remainder of non-face-to-face time involved reviewing chart documents/testing relevant to the patient encounter and documentation in the medical record. I have independently reviewed documentation from referring provider  Medication Adjustments/Labs and Tests Ordered: Current medicines are reviewed at length with the patient today.  Concerns regarding medicines are outlined above.  Medication changes, Labs and Tests ordered today are listed in the Patient Instructions below.  There are [name] Patient Instructions on file for this visit.   Signed, Armanda Magic, MD  04/03/2023 9:04 AM    [facility] [address] [place], [place], [place]  [address] Phone: [phone]; Fax: [phone]

## 2023-04-04 ENCOUNTER — Encounter: Payer: Self-pay | Admitting: Cardiology

## 2023-04-11 ENCOUNTER — Other Ambulatory Visit: Payer: Self-pay

## 2023-04-11 DIAGNOSIS — G4733 Obstructive sleep apnea (adult) (pediatric): Secondary | ICD-10-CM

## 2023-04-11 DIAGNOSIS — I513 Intracardiac thrombosis, not elsewhere classified: Secondary | ICD-10-CM

## 2023-04-11 DIAGNOSIS — I5022 Chronic systolic (congestive) heart failure: Secondary | ICD-10-CM

## 2023-04-11 DIAGNOSIS — I10 Essential (primary) hypertension: Secondary | ICD-10-CM

## 2023-04-11 DIAGNOSIS — I2510 Atherosclerotic heart disease of native coronary artery without angina pectoris: Secondary | ICD-10-CM

## 2023-04-11 DIAGNOSIS — E785 Hyperlipidemia, unspecified: Secondary | ICD-10-CM

## 2023-04-11 MED ORDER — ENTRESTO 24-26 MG PO TABS: 1.0000 | ORAL_TABLET | Freq: Two times a day (BID) | ORAL | 5 refills | Status: DC

## 2023-04-11 NOTE — Telephone Encounter (Signed)
 Can we lower his entresto to 24/26mg  bid and update [place] in 1 week on dizziness  Dominga Ferry MD

## 2023-04-11 NOTE — Progress Notes (Signed)
 Order for [employer] placed today and sent to [employer]

## 2023-04-15 DIAGNOSIS — G4733 Obstructive sleep apnea (adult) (pediatric): Secondary | ICD-10-CM | POA: Diagnosis not present

## 2023-05-20 ENCOUNTER — Telehealth: Payer: Self-pay | Admitting: Cardiology

## 2023-05-20 NOTE — Telephone Encounter (Signed)
 Spoke with patient regarding note advised him will send to provider regarding whether he needed to take Entresto  or not.

## 2023-05-20 NOTE — Telephone Encounter (Signed)
 PT came into [place] office in regards toa message he sent on the 13th of May. He has not heard back from anyone with in the office since the message was sent. Pt is upset and would like to know something as soon as possible.

## 2023-05-20 NOTE — Telephone Encounter (Signed)
 Is the dizziness any better off the entresto ? Does the dizziness happen only with standing or can it happen while laying down or sitting?  Letta Raw MD

## 2023-05-21 ENCOUNTER — Ambulatory Visit (INDEPENDENT_AMBULATORY_CARE_PROVIDER_SITE_OTHER): Payer: PRIVATE HEALTH INSURANCE | Admitting: Family Medicine

## 2023-05-21 VITALS — BP 118/76 | HR 60 | Temp 98.6°F | Ht 70.0 in | Wt 256.8 lb

## 2023-05-21 DIAGNOSIS — R3589 Other polyuria: Secondary | ICD-10-CM

## 2023-05-21 DIAGNOSIS — E119 Type 2 diabetes mellitus without complications: Secondary | ICD-10-CM | POA: Diagnosis not present

## 2023-05-21 LAB — URINALYSIS, ROUTINE W REFLEX MICROSCOPIC
Bilirubin Urine: NEGATIVE
Hgb urine dipstick: NEGATIVE
Ketones, ur: NEGATIVE
Leukocytes,Ua: NEGATIVE
Nitrite: NEGATIVE
Protein, ur: NEGATIVE
Specific Gravity, Urine: 1.025 (ref 1.001–1.035)
pH: 5.5 (ref 5.0–8.0)

## 2023-05-21 NOTE — Progress Notes (Signed)
 Subjective:    Patient ID: Benjamin [name], male    DOB: [birth year], 73 y.o.   MRN: [id number]  Urinary Frequency  Associated symptoms include frequency.   Wt Readings from Last 3 Encounters:  05/21/23 256 lb 12.8 oz (116.5 kg)  04/03/23 255 lb 12.8 oz (116 kg)  01/24/23 260 lb (117.9 kg)    I last saw the patient in 2023.  Past medical history significant for coronary artery disease, congestive heart failure ejection fraction, prediabetes.  Patient is currently seeing Dr. Enrique Harvest as PCP.  I do not have access to his most recent lab work.  However the patient has a hemoglobin A1c from last year that was 6.7 and a PSA from last year that was 1.4.  He reports increased urinary frequency.  He states that he can go to the bathroom every 2 hours.  He denies any dysuria.  He denies any hematuria.  He denies any hesitancy.  He is taking Entresto , spironolactone , and Farxiga .  I believe the patient is concerned over the news regarding general guidance prostate cancer diagnosis.  He is concerned that this may be a prostate issue.  Urinalysis today shows +2 glucose, negative protein, negative blood, negative nitrates, and negative leukocyte esterase.   Past Medical History:  Diagnosis Date   Allergy    ASCVD (arteriosclerotic cardiovascular disease)    Diabetes mellitus without complication (HCC)    prediabetes   HFrEF (heart failure with reduced ejection fraction) (HCC)    Hyperlipidemia    Hypertension    Left ventricular apical thrombus    Myocardial infarction (HCC) 04/20/1999   Obesity (BMI 30-39.9)    OSA (obstructive sleep apnea) 11/02/2015   Past Surgical History:  Procedure Laterality Date   CARDIAC CATHETERIZATION N/A 05/19/2015   Procedure: Right/Left Heart Cath and Coronary Angiography;  Surgeon: Arty Binning, MD;  Location: [facility] INVASIVE CV LAB;  Service: Cardiovascular;  Laterality: N/A;   CORONARY STENT PLACEMENT  04/18/1999   RIGHT/LEFT HEART CATH AND CORONARY ANGIOGRAPHY N/A  06/21/2021   Procedure: RIGHT/LEFT HEART CATH AND CORONARY ANGIOGRAPHY;  Surgeon: Kyra Phy, MD;  Location: [facility] INVASIVE CV LAB;  Service: Cardiovascular;  Laterality: N/A;   Current Outpatient Medications on File Prior to Visit  Medication Sig Dispense Refill   acetaminophen  (TYLENOL ) 500 MG tablet Take 1,000 mg by mouth every 6 (six) hours as needed for moderate pain.     aspirin  EC 81 MG tablet Take 1 tablet (81 mg total) by mouth daily. Swallow whole. Start after eliquis  is stopped 90 tablet 3   atorvastatin  (LIPITOR) 80 MG tablet TAKE ONE TABLET BY MOUTH EVERY EVENING AT 6PM 90 tablet 3   cholecalciferol (VITAMIN D3) 25 MCG (1000 UNIT) tablet Take 1,000 Units by mouth daily.     FARXIGA  10 MG TABS tablet TAKE 1 TABLET BY MOUTH DAILY BEFORE BREAKFAST 90 tablet 3   sacubitril -valsartan  (ENTRESTO ) 24-26 MG Take 1 tablet by mouth 2 (two) times daily. 60 tablet 5   spironolactone  (ALDACTONE ) 25 MG tablet TAKE 1/2 TABLET BY MOUTH DAILY 45 tablet 2   vitamin B-12 (CYANOCOBALAMIN ) 1000 MCG tablet Take 1,000 mcg by mouth daily.     No current facility-administered medications on file prior to visit.   Allergies  Allergen Reactions   Penicillins Swelling   Social History   Socioeconomic History   Marital status: Married    Spouse name: Glynis Lass   Number of children: 2   Years of education: Not on file  Highest education level: 12th grade  Occupational History   Not on file  Tobacco Use   Smoking status: Former    Current packs/day: 0.00    Average packs/day: 0.5 packs/day for 8.0 years (4.0 ttl pk-yrs)    Types: Cigarettes    Start date: 07/24/1966    Quit date: 07/24/1974    Years since quitting: 48.8    Passive exposure: Past   Smokeless tobacco: Never  Vaping Use   Vaping status: Never Used  Substance and Sexual Activity   Alcohol use: No    Alcohol/week: 0.0 standard drinks of alcohol   Drug use: No   Sexual activity: Yes    Birth control/protection: None  Other Topics  Concern   Not on file  Social History Narrative   Pt currently works at [employer].    Social Drivers of Corporate investment banker Strain: Low Risk  (05/20/2023)   Overall Financial Resource Strain (CARDIA)    Difficulty of Paying Living Expenses: Not very hard  Food Insecurity: No Food Insecurity (05/20/2023)   Hunger Vital Sign    Worried About Running Out of Food in the Last Year: Never true    Ran Out of Food in the Last Year: Never true  Transportation Needs: No Transportation Needs (05/20/2023)   PRAPARE - Administrator, Civil Service (Medical): No    Lack of Transportation (Non-Medical): No  Physical Activity: Insufficiently Active (05/20/2023)   Exercise Vital Sign    Days of Exercise per Week: 3 days    Minutes of Exercise per Session: 40 min  Stress: No Stress Concern Present (05/20/2023)   [employer] of Occupational Health - Occupational Stress Questionnaire    Feeling of Stress : Not at all  Social Connections: Socially Isolated (05/20/2023)   Social Connection and Isolation Panel [NHANES]    Frequency of Communication with Friends and Family: Once a week    Frequency of Social Gatherings with Friends and Family: Once a week    Attends Religious Services: Never    Database administrator or Organizations: No    Attends Engineer, structural: Not on file    Marital Status: Married  Intimate Partner Violence: Unknown (03/09/2021)   Humiliation, Afraid, Rape, and Kick questionnaire    Fear of Current or Ex-Partner: Not on file    Emotionally Abused: No    Physically Abused: No    Sexually Abused: No     Review of Systems  Genitourinary:  Positive for frequency.  All other systems reviewed and are negative.      Objective:   Physical Exam Vitals reviewed.  Constitutional:      Appearance: He is well-developed.  HENT:     Nose: No mucosal edema or rhinorrhea.     Right Sinus: No maxillary sinus tenderness or frontal sinus  tenderness.     Left Sinus: No maxillary sinus tenderness or frontal sinus tenderness.     Mouth/Throat:     Pharynx: No oropharyngeal exudate.  Eyes:     Conjunctiva/sclera: Conjunctivae normal.     Pupils: Pupils are equal, round, and reactive to light.  Cardiovascular:     Rate and Rhythm: Normal rate and regular rhythm.     Heart sounds: Normal heart sounds. No murmur heard. Pulmonary:     Effort: Pulmonary effort is normal. No respiratory distress.     Breath sounds: Normal breath sounds. No wheezing or rales.  Genitourinary:    Prostate: Normal.  Rectum: Normal.  Musculoskeletal:     Cervical back: Neck supple.  Lymphadenopathy:     Cervical: No cervical adenopathy.           Assessment & Plan:  Polyuria - Plan: Hemoglobin A1c, CBC with Differential/Platelet, Comprehensive metabolic panel with GFR, Urine Culture, PSA, Urinalysis, Routine w reflex microscopic Prostate exam today is normal.  Urinalysis shows +2 glucose which is most likely due to the Farxiga .  No evidence of urinary tract infection or prostate infection.  I will check a PSA but I do not feel that this is anything to do with his prostate.  I suspect that the patient likely is dealing with side effects of his medication including Entresto , Farxiga , and spironolactone  coupled with possible overactive bladder.  Await the results of his lab work but I reassured the patient I do not feel any abnormalities today.  I would recommend that he follow-up with his PCP if he desires to change medication to discuss his options.

## 2023-05-22 ENCOUNTER — Encounter: Payer: Self-pay | Admitting: Cardiology

## 2023-05-22 LAB — URINE CULTURE
MICRO NUMBER:: 16478580
Result:: NO GROWTH
SPECIMEN QUALITY:: ADEQUATE

## 2023-05-22 LAB — COMPREHENSIVE METABOLIC PANEL WITH GFR
AG Ratio: 1.9 (calc) (ref 1.0–2.5)
ALT: 20 U/L (ref 9–46)
AST: 16 U/L (ref 10–35)
Albumin: 4.2 g/dL (ref 3.6–5.1)
Alkaline phosphatase (APISO): 101 U/L (ref 35–144)
BUN: 10 mg/dL (ref 7–25)
CO2: 28 mmol/L (ref 20–32)
Calcium: 9.1 mg/dL (ref 8.6–10.3)
Chloride: 106 mmol/L (ref 98–110)
Creat: 0.86 mg/dL (ref 0.70–1.28)
Globulin: 2.2 g/dL (ref 1.9–3.7)
Glucose, Bld: 95 mg/dL (ref 65–99)
Potassium: 4.2 mmol/L (ref 3.5–5.3)
Sodium: 141 mmol/L (ref 135–146)
Total Bilirubin: 0.7 mg/dL (ref 0.2–1.2)
Total Protein: 6.4 g/dL (ref 6.1–8.1)
eGFR: 91 mL/min/{1.73_m2} (ref >=60)

## 2023-05-22 LAB — HEMOGLOBIN A1C
Hgb A1c MFr Bld: 6.2 % — ABNORMAL HIGH (ref <5.7)
Mean Plasma Glucose: 131 mg/dL
eAG (mmol/L): 7.3 mmol/L

## 2023-05-22 LAB — CBC WITH DIFFERENTIAL/PLATELET
Absolute Lymphocytes: 3332 {cells}/uL (ref 850–3900)
Absolute Monocytes: 766 {cells}/uL (ref 200–950)
Basophils Absolute: 78 {cells}/uL (ref 0–200)
Basophils Relative: 0.9 %
Eosinophils Absolute: 252 {cells}/uL (ref 15–500)
Eosinophils Relative: 2.9 %
HCT: 53 % — ABNORMAL HIGH (ref 38.5–50.0)
Hemoglobin: 17.6 g/dL — ABNORMAL HIGH (ref 13.2–17.1)
MCH: 28.9 pg (ref 27.0–33.0)
MCHC: 33.2 g/dL (ref 32.0–36.0)
MCV: 87.2 fL (ref 80.0–100.0)
MPV: 10 fL (ref 7.5–12.5)
Monocytes Relative: 8.8 %
Neutro Abs: 4272 {cells}/uL (ref 1500–7800)
Neutrophils Relative %: 49.1 %
Platelets: 277 10*3/uL (ref 140–400)
RBC: 6.08 10*6/uL — ABNORMAL HIGH (ref 4.20–5.80)
RDW: 13.5 % (ref 11.0–15.0)
Total Lymphocyte: 38.3 %
WBC: 8.7 10*3/uL (ref 3.8–10.8)

## 2023-05-22 LAB — PSA: PSA: 3.4 ng/mL (ref <=4.00)

## 2023-05-22 NOTE — Telephone Encounter (Signed)
 Per Dr.Branch  could he see me tomorrow at noon, little difficult to keep track of symptoms and med changes and likely a face to face visit would help sort things out better    Patient informed and verbalized understanding of plan.   Patient will come in 5/22 at 12 in [place] office

## 2023-05-22 NOTE — Telephone Encounter (Signed)
 Patient sent another MyChart message about this medication. Forwarded to Dr.Branch and sent secure chat

## 2023-05-23 ENCOUNTER — Ambulatory Visit: Attending: Cardiology | Admitting: Cardiology

## 2023-05-23 ENCOUNTER — Ambulatory Visit: Payer: Self-pay | Admitting: Family Medicine

## 2023-05-23 ENCOUNTER — Telehealth: Payer: Self-pay

## 2023-05-23 ENCOUNTER — Encounter: Payer: Self-pay | Admitting: Cardiology

## 2023-05-23 VITALS — BP 116/79 | Ht 70.0 in | Wt 257.2 lb

## 2023-05-23 DIAGNOSIS — I5022 Chronic systolic (congestive) heart failure: Secondary | ICD-10-CM | POA: Diagnosis not present

## 2023-05-23 MED ORDER — ENTRESTO 24-26 MG PO TABS: 1.0000 | ORAL_TABLET | Freq: Two times a day (BID) | ORAL | 3 refills | Status: DC

## 2023-05-23 NOTE — Patient Instructions (Signed)
 Medication Instructions:   HOLD Farxiga  for 1 week, re-start if dizziness is not better, THEN  Hold for spironolactone  for 1 week, if dizziness no better, re-sTart it.   CALL US  WITH UPDATE EITHER WAY in 2 weeks  Labwork: None today  Testing/Procedures: None today  Follow-Up: 6 months  Any Other Special Instructions Will Be Listed Below (If Applicable).  If you need a refill on your cardiac medications before your next appointment, please call your pharmacy.

## 2023-05-23 NOTE — Progress Notes (Signed)
 Clinical Summary [name] is a 73 y.o.male seen today for follow up of the following medical problems.  This is a focused visit on recent ongoing issues of lightheadedness   1.Lightheadedness - mentioned last visit. Somewhat limited oral hydration at the time, recommended increasing hydration and monitoring symptoms - ongoing symptoms despite hydration, tried lowering and then holding entresto  for a period without improvement - - EKG sinus brady 59, LAFB - orthostatic vitals are negative - only occurs with standing. Often with leanging -  no presyncope, syncope No other associated symptoms - [place] x 3-4 per day  - does report history of neuropathy.     Other medical problems not addressed this visit  1. CAD - previous notes mention MI in 2001 at [facility], cannot find cath report. Patient has stent card that shows he received a stent to his LAD at that time.   - repeat cath 05/2015 that showed patent coronaries.  This was down after he was found to have a drop in LVEF 06/2021 RHC/LHC: mild to mod CAD, CI 3.6, PCWP 9, mean PA 19   - no chest pains, no SOB/DOE - compliant with meds. - some orthostatilc symptoms, reports some limitations on oral hydration.      2. Chronic systolic heart failure/NICM -  05/2015 echo showed an LVEF 25-30%, this is a new finding of systolic dysfunction. - 05/2015 cath showed patent coronaries. Normal filling pressures at that time.    - repeat echo 09/2015, LVEF improved to 45-50%. Grade I diasotlic dysfunction.    - coreg  dosing has been limited by low bp's and HRs - 05/2021 echo LVEF 25-30%, indicating recurrent systolic dysfunction - insurance had turned down entresto . Ran out 03/01/21, started losartan  50mg  daily.  - no recent chest pain, no SOB/DOE, no LE edema     06/2021 RHC/LHC: mild to mod CAD, CI 3.6, PCWP 9, mean PA 19 10/2021 cMRI: LVEF 35%, possible very small LV apical thrombus. LGE suggests prior LAD infarct.  Jan  2024 limited echo: LVEF 35% - did not tolerate coreg  due to bradycardia          3. OSA  - followed by Dr Micael Adas, due for f/u - remain compliant with cpap.    4. HL -04/2017 lipids at goal, compliant with statin.    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  Past Medical History:  Diagnosis Date   Allergy    ASCVD (arteriosclerotic cardiovascular disease)    Diabetes mellitus without complication (HCC)    prediabetes   HFrEF (heart failure with reduced ejection fraction) (HCC)    Hyperlipidemia    Hypertension    Left ventricular apical thrombus    Myocardial infarction (HCC) 04/20/1999   Obesity (BMI 30-39.9)    OSA (obstructive sleep apnea) 11/02/2015     Allergies  Allergen Reactions   Penicillins Swelling     Current Outpatient Medications  Medication Sig Dispense Refill   acetaminophen  (TYLENOL ) 500 MG tablet Take 1,000 mg by mouth every 6 (six) hours as needed for moderate pain.     aspirin  EC 81 MG tablet Take 1 tablet (81 mg total) by mouth daily. Swallow whole. Start after eliquis  is stopped  90 tablet 3   atorvastatin  (LIPITOR) 80 MG tablet TAKE ONE TABLET BY MOUTH EVERY EVENING AT 6PM 90 tablet 3   cholecalciferol (VITAMIN D3) 25 MCG (1000 UNIT) tablet Take 1,000 Units by mouth daily.     FARXIGA  10 MG TABS tablet TAKE 1 TABLET BY MOUTH DAILY BEFORE BREAKFAST 90 tablet 3   sacubitril -valsartan  (ENTRESTO ) 24-26 MG Take 1 tablet by mouth 2 (two) times daily. 60 tablet 5   spironolactone  (ALDACTONE ) 25 MG tablet TAKE 1/2 TABLET BY MOUTH DAILY 45 tablet 2   vitamin B-12 (CYANOCOBALAMIN ) 1000 MCG tablet Take 1,000 mcg by mouth daily.     No current facility-administered medications for this visit.     Past Surgical History:  Procedure Laterality Date   CARDIAC CATHETERIZATION N/A 05/19/2015    Procedure: Right/Left Heart Cath and Coronary Angiography;  Surgeon: Arty Binning, MD;  Location: [facility] INVASIVE CV LAB;  Service: Cardiovascular;  Laterality: N/A;   CORONARY STENT PLACEMENT  04/18/1999   RIGHT/LEFT HEART CATH AND CORONARY ANGIOGRAPHY N/A 06/21/2021   Procedure: RIGHT/LEFT HEART CATH AND CORONARY ANGIOGRAPHY;  Surgeon: Kyra Phy, MD;  Location: [facility] INVASIVE CV LAB;  Service: Cardiovascular;  Laterality: N/A;     Allergies  Allergen Reactions   Penicillins Swelling      Family History  Problem Relation Age of Onset   AAA (abdominal aortic aneurysm) Father      Social History [name] reports that he quit smoking about 48 years ago. His smoking use included cigarettes. He started smoking about 56 years ago. He has a 4 pack-year smoking history. He has been exposed to tobacco smoke. He has never used smokeless tobacco. [name] reports no history of alcohol use.    Physical Examination Today's Vitals   05/23/23 1152  BP: 116/79  SpO2: 91%  Weight: 257 lb 3.2 oz (116.7 kg)  Height: 5\' 10"  (1.778 m)  PainSc: 0-No pain   Body mass index is 36.9 kg/m.  Gen: resting comfortably, no acute distress HEENT: no scleral icterus, pupils equal round and reactive, no palptable cervical adenopathy,  CV: RRR, no m/r,g no jvd Resp: Clear to auscultation bilaterally GI: abdomen is soft, non-tender, non-distended, normal bowel sounds, no hepatosplenomegaly MSK: extremities are warm, no edema.  Skin: warm, no rash Neuro:  no focal deficits Psych: appropriate affect   Diagnostic Studies  05/2015 echo Study Conclusions   - Left ventricle: The cavity size was normal. Wall thickness was   normal. Systolic function was severely reduced. The estimated   ejection fraction was in the range of 25% to 30%. Diffuse   hypokinesis. Doppler parameters are consistent with abnormal left   ventricular relaxation (grade 1 diastolic dysfunction). - Regional wall motion  abnormality: Akinesis of the mid anterior,   basal-mid anteroseptal, and apical myocardium. - Aortic valve: Mildly calcified annulus. Trileaflet; mildly   thickened leaflets. Valve area (VTI): 2.42 cm^2. Valve area   (Vmax): 2.32 cm^2. - Atrial septum: No defect or patent foramen ovale was identified. - Technically difficult study. Echocontrast was used to enhance   visualization.         05/2015 cath Prox RCA to Mid RCA lesion, 20% stenosed. Ost LAD to Prox LAD lesion, 30% stenosed. The lesion was previously treated with a stent (unknown type). Dist Cx lesion, 50% stenosed.   Severe anterior apical hypokinesis. Estimated ejection fraction 35%. Normal left ventricular hemodynamics with EDP of 14 mmHg and mean capillary wedge 17 mmHg. Widely patent coronary arteries with  30% diffuse ISR in the proximal LAD stent. There is 50% eccentric stenosis in the distal circumflex. Luminal irregularities are noted in the right coronary.   Post cath RECOMMENDATIONS:   Aggressive risk factor modification to prevent progression of CAD Aggressive heart failure therapy to minimize progression of LV dysfunction.     09/2015 echo Study Conclusions   - Left ventricle: The cavity size was normal. Wall thickness was   normal. Systolic function was mildly reduced. The estimated   ejection fraction was in the range of 45% to 50%. Doppler   parameters are consistent with abnormal left ventricular   relaxation (grade 1 diastolic dysfunction). - Regional wall motion abnormality: Hypokinesis of the mid   anterior, mid anteroseptal, and apical myocardium. - Aortic valve: Mildly calcified annulus. Trileaflet; mildly   thickened leaflets. Valve area (VTI): 3.07 cm^2. Valve area   (Vmax): 2.7 cm^2. Valve area (Vmean): 2.7 cm^2. - Technically difficult study.     04/2021 echo IMPRESSIONS     1. LV endocardium poorly visualized. Roughly LVEF is in the 40-50% range.  Recommend limited study with echocontrast  for more precise estimate. .  Left ventricular ejection fraction, by estimation, is 40-50%. The left  ventricle has mildly decreased to low   normal function. Left ventricular endocardial border not optimally  defined to evaluate regional wall motion. Left ventricular diastolic  parameters are consistent with Grade I diastolic dysfunction (impaired  relaxation).   2. Right ventricular systolic function is normal. The right ventricular  size is normal. Tricuspid regurgitation signal is inadequate for assessing  PA pressure.   3. The mitral valve was not well visualized. No evidence of mitral valve  regurgitation. No evidence of mitral stenosis.   4. The aortic valve is tricuspid. Aortic valve regurgitation is not  visualized. No aortic stenosis is present.   5. The inferior vena cava is normal in size with greater than 50%  respiratory variability, suggesting right atrial pressure of 3 mmHg.   Comparison(s): LVEF 45-50%.    05/2021 limited echo 1. Limited study.   2. Left ventricular ejection fraction, by estimation, is 25 to 30%. The  left ventricle has severely decreased function. The left ventricle  demonstrates regional wall motion abnormalities (see scoring  diagram/findings for description). The left  ventricular internal cavity size was mildly dilated.   3. Definity  contrast shows slow flow and loosely organized thrombotic  material at LV apex, although no clearly formed mural thrombus.   4. Right ventricular systolic function is normal. The right ventricular  size is normal.      06/2021 RHC/LHC Dist Cx lesion is 50% stenosed.   Prox LAD lesion is 20% stenosed.   Mid LAD lesion is 20% stenosed.   LV end diastolic pressure is normal.   1.  Patent proximal LAD stent with mild in-stent restenosis and mild disease elsewhere. 2.  LVEDP of 18 mmHg. 3.  Cardiac output of 8.46 L/min and cardiac index of 3.6 L/min/m by Fick. 4.  Relatively normal filling pressures with a mean RA  of 2 mmHg, mean wedge of 9 mmHg, mean PA of 19 mmHg, with normal pulmonary artery pulsatility index.   Recommendation: Goal-directed medical therapy for nonischemic cardiomyopathy.         Assessment and Plan  1.Lightheadedness - symptoms only occur with standing - orthostatics are negative today - discussed again increase oral hydration - did not improve with holding entresto . We will try holding farxiga  for 1 week, if not better restart  and hold aldactone  for 1 week - reports some issues with leg neuropathy but orthostatics are negative here, don't see signs of autonomic neuroparhy EKG today shows sinus brady at 59      Laurann Pollock, M.D.

## 2023-05-23 NOTE — Telephone Encounter (Signed)
 Patient being seen today with Dr. Amanda Jungling

## 2023-06-17 DIAGNOSIS — G4733 Obstructive sleep apnea (adult) (pediatric): Secondary | ICD-10-CM | POA: Diagnosis not present

## 2023-06-27 ENCOUNTER — Telehealth: Payer: Self-pay | Admitting: Cardiology

## 2023-06-27 ENCOUNTER — Encounter: Payer: Self-pay | Admitting: Cardiology

## 2023-06-27 DIAGNOSIS — R42 Dizziness and giddiness: Secondary | ICD-10-CM

## 2023-06-27 NOTE — Telephone Encounter (Signed)
 See previous phone note 05/23/23  I will forward to Dr.Branch

## 2023-06-27 NOTE — Telephone Encounter (Signed)
.  Pt c/o medication issue:  1. Name of Medication: Farxiga  and Spironolactone   2. How are you currently taking this medication (dosage and times per day)?   3. Are you having a reaction (difficulty breathing--STAT)?   4. What is your medication issue? Patient said he was told to stop taking these medicine to see if this was causing his dizziness and lightheaded. He says he is still having it, worse than before   STAT if patient feels like he/she is going to faint   1. Are you feeling dizzy, lightheaded, or faint right now?   Yes both   2. Have you passed out?   (If yes move to .SYNCOPECHMG) no   3. Do you have any other symptoms? Feels like pressure in his head   4. Have you checked your HR and BP (record if available)?  Does not have a blood pressure cuff

## 2023-07-01 NOTE — Telephone Encounter (Signed)
 I spoke with patient and he awaits call for Neuro consult.

## 2023-07-01 NOTE — Telephone Encounter (Signed)
 Patient called back to say he still feels dizziness whenever he is up and walking. He is fine when seated or driving. He says he can onlt walk up one step at a time without being dizzy. He also says when he first lies down in the bed he has sensation of room spinning but it resolves quickly.  He stopped the spironolactone  and farxiga  and noted no difference.   He is going to buy a bp machine.  He drinks 2-3  16.9 oz bottles of water and 2-3 cups of coffee a day   Please advise

## 2023-07-17 ENCOUNTER — Other Ambulatory Visit: Payer: Self-pay

## 2023-07-17 DIAGNOSIS — I5022 Chronic systolic (congestive) heart failure: Secondary | ICD-10-CM

## 2023-07-17 MED ORDER — SPIRONOLACTONE 25 MG PO TABS: 12.5000 mg | ORAL_TABLET | Freq: Every day | ORAL | 3 refills | Status: AC

## 2023-08-13 ENCOUNTER — Encounter: Payer: Self-pay | Admitting: Neurology

## 2023-08-13 ENCOUNTER — Encounter: Payer: Self-pay | Admitting: Cardiology

## 2023-08-13 ENCOUNTER — Telehealth: Payer: Self-pay | Admitting: Neurology

## 2023-08-13 ENCOUNTER — Ambulatory Visit: Admitting: Neurology

## 2023-08-13 VITALS — Ht 70.0 in | Wt 260.0 lb

## 2023-08-13 DIAGNOSIS — R269 Unspecified abnormalities of gait and mobility: Secondary | ICD-10-CM | POA: Diagnosis not present

## 2023-08-13 DIAGNOSIS — R29898 Other symptoms and signs involving the musculoskeletal system: Secondary | ICD-10-CM | POA: Diagnosis not present

## 2023-08-13 DIAGNOSIS — E519 Thiamine deficiency, unspecified: Secondary | ICD-10-CM | POA: Diagnosis not present

## 2023-08-13 DIAGNOSIS — R200 Anesthesia of skin: Secondary | ICD-10-CM | POA: Diagnosis not present

## 2023-08-13 DIAGNOSIS — R635 Abnormal weight gain: Secondary | ICD-10-CM

## 2023-08-13 DIAGNOSIS — E531 Pyridoxine deficiency: Secondary | ICD-10-CM

## 2023-08-13 DIAGNOSIS — R2689 Other abnormalities of gait and mobility: Secondary | ICD-10-CM | POA: Diagnosis not present

## 2023-08-13 DIAGNOSIS — G629 Polyneuropathy, unspecified: Secondary | ICD-10-CM | POA: Insufficient documentation

## 2023-08-13 DIAGNOSIS — R270 Ataxia, unspecified: Secondary | ICD-10-CM

## 2023-08-13 DIAGNOSIS — E119 Type 2 diabetes mellitus without complications: Secondary | ICD-10-CM

## 2023-08-13 NOTE — Progress Notes (Signed)
 [facility]    Provider:  Dr Ines Requesting Provider: Alvan Dorn FALCON, MD Primary Care Provider:  Nanci Senior, MD  CC:  Dizziness all the time  HPI:  Benjamin [name] is a 73 y.o. male here as requested by Alvan Dorn FALCON, MD for dizziness. has Allergy; Myocardial infarction [facility]); ASCVD (arteriosclerotic cardiovascular disease); Chronic systolic heart failure (HCC); CAD in native artery; OSA (obstructive sleep apnea); Benign essential HTN; Obesity (BMI 30-39.9); and Polyneuropathy on their problem list.  Patient was referred by Dorn Alvan.  The visit in May 2025 by Dr. 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  Anytime that he bends over, gets up or going down steps(going up is fine), he feels he has to hang onto things no pre-syncope or loss of consciousness, the room is not moving, he doesn't feel her is moving. He feels his balance is off, he has to hold onto things when he standing. Unclear trigger, he mentions farciga 6 months ago, not worsening per patient but wife provides much information and states she feels it is worse. When he ws fixing a pipe he had difficulty getting up and when he stood up he had to stand there for a moment before he took some steps. Wife states it is more dizziness. His balance is poor. In the shower when he closes his eyes. He has numbness and tingling in the feet, his feet are numb all the time. He has to ave shoes on all the time. At night legs will throb. He has had prediabetes for some time. No neck pain or neck problems. No  dizziness/feeling similar when sitting, even when walking he may feel it. More when bending over or going down steps. No significant low back pain.    Reviewed notes, labs and imaging from outside physicians, which showed:  05/21/2023: reviewed labs Cmp nml Cbc with elevated hgb/hct 17.6/53 otherwise unremarkable Hgba1c 6.2  Review of Systems: Patient complains of symptoms per HPI as well as the following symptoms: none. Pertinent negatives and positives per HPI. All others negative.   Social History   Socioeconomic History   Marital status: Married    Spouse name: Edna   Number of children: 2   Years of education: Not on file   Highest education level: 12th grade  Occupational History   Not on file  Tobacco Use   Smoking status: Former    Current packs/day: 0.00    Average packs/day: 0.5 packs/day for 8.0 years (4.0 ttl pk-yrs)    Types: Cigarettes    Start date: 07/24/1966    Quit date: 07/24/1974    Years since quitting: 49.0    Passive exposure: Past   Smokeless tobacco: Never  Vaping Use   Vaping status: Never Used  Substance and Sexual Activity   Alcohol use: No    Alcohol/week: 0.0 standard drinks of alcohol   Drug use: No   Sexual activity: Yes    Birth control/protection: None  Other Topics Concern   Not on file  Social History Narrative   Retired    Lives with wife    Social Drivers of Corporate investment banker Strain: Low Risk  (05/20/2023)  Overall Financial Resource Strain (CARDIA)    Difficulty of Paying Living Expenses: Not very hard  Food Insecurity: No Food Insecurity (05/20/2023)   Hunger Vital Sign    Worried About Running Out of Food in the Last Year: Never true    Ran Out of Food in the Last Year: Never true  Transportation Needs: No Transportation Needs (05/20/2023)   PRAPARE - Administrator, Civil Service (Medical): No    Lack of Transportation (Non-Medical): No  Physical Activity: Insufficiently Active (05/20/2023)    Exercise Vital Sign    Days of Exercise per Week: 3 days    Minutes of Exercise per Session: 40 min  Stress: No Stress Concern Present (05/20/2023)   [employer] of Occupational Health - Occupational Stress Questionnaire    Feeling of Stress : Not at all  Social Connections: Socially Isolated (05/20/2023)   Social Connection and Isolation Panel    Frequency of Communication with Friends and Family: Once a week    Frequency of Social Gatherings with Friends and Family: Once a week    Attends Religious Services: Never    Database administrator or Organizations: No    Attends Engineer, structural: Not on file    Marital Status: Married  Intimate Partner Violence: Unknown (03/09/2021)   Humiliation, Afraid, Rape, and Kick questionnaire    Fear of Current or Ex-Partner: Not on file    Emotionally Abused: No    Physically Abused: No    Sexually Abused: No    Family History  Problem Relation Age of Onset   AAA (abdominal aortic aneurysm) Father     Past Medical History:  Diagnosis Date   Allergy    ASCVD (arteriosclerotic cardiovascular disease)    Diabetes mellitus without complication (HCC)    prediabetes   HFrEF (heart failure with reduced ejection fraction) (HCC)    Hyperlipidemia    Hypertension    Left ventricular apical thrombus    Myocardial infarction (HCC) 04/20/1999   Obesity (BMI 30-39.9)    OSA (obstructive sleep apnea) 11/02/2015    Patient Active Problem List   Diagnosis Date Noted   Polyneuropathy 08/13/2023   OSA (obstructive sleep apnea) 11/02/2015   Benign essential HTN 11/02/2015   Obesity (BMI 30-39.9) 11/02/2015   Chronic systolic heart failure (HCC) 05/19/2015   CAD in native artery 05/19/2015   ASCVD (arteriosclerotic cardiovascular disease)    Allergy    Myocardial infarction (HCC) 04/20/1999    Past Surgical History:  Procedure Laterality Date   CARDIAC CATHETERIZATION N/A 05/19/2015   Procedure: Right/Left Heart Cath and Coronary  Angiography;  Surgeon: Victory LELON Sharps, MD;  Location: [facility] IN[facility]ASIVE [facility];  Service: Cardiovascular;  Laterality: N/A;   CORONARY STENT PLACEMENT  04/18/1999   RIGHT/LEFT HEART CATH AND CORONARY ANGIOGRAPHY N/A 06/21/2021   Procedure: RIGHT/LEFT HEART CATH AND CORONARY ANGIOGRAPHY;  Surgeon: Wendel Lurena POUR, MD;  Location: [facility] INVASIVE CV LAB;  Service: Cardiovascular;  Laterality: N/A;    Current Outpatient Medications  Medication Sig Dispense Refill   acetaminophen  (TYLENOL ) 500 MG tablet Take 1,000 mg by mouth every 6 (six) hours as needed for moderate pain.     aspirin  EC 81 MG tablet Take 1 tablet (81 mg total) by mouth daily. Swallow whole. Start after eliquis  is stopped 90 tablet 3   atorvastatin  (LIPITOR) 80 MG tablet TAKE ONE TABLET BY MOUTH EVERY EVENING AT 6PM 90 tablet 3   cholecalciferol (VITAMIN D3) 25 MCG (1000 UNIT) tablet  Take 1,000 Units by mouth daily.     FARXIGA  10 MG TABS tablet TAKE 1 TABLET BY MOUTH DAILY BEFORE BREAKFAST 90 tablet 3   sacubitril -valsartan  (ENTRESTO ) 24-26 MG Take 1 tablet by mouth 2 (two) times daily. 180 tablet 3   spironolactone  (ALDACTONE ) 25 MG tablet Take 0.5 tablets (12.5 mg total) by mouth daily. 45 tablet 3   vitamin B-12 (CYANOCOBALAMIN ) 1000 MCG tablet Take 1,000 mcg by mouth daily.     No current facility-administered medications for this visit.    Allergies as of 08/13/2023 - Review Complete 08/13/2023  Allergen Reaction Noted   Penicillins Swelling    Orthostatic Vitals for the past 48 hrs (Last 6 readings):  Patient Position (if appropriate) BP- Standing at 0 minutes Pulse- Standing at 0 minutes BP- Lying Pulse- Lying  08/13/23 0742 Orthostatic Vitals -- -- -- --  08/13/23 0743 Orthostatic Vitals 112/76 69 117/75 58    Vitals: Ht 5' 10 (1.778 m)   Wt 260 lb (117.9 kg)   BMI 37.31 kg/m  Last Weight:  Wt Readings from Last 1 Encounters:  08/13/23 260 lb (117.9 kg)   Last Height:   Ht Readings from Last 1 Encounters:  08/13/23  5' 10 (1.778 m)     Physical exam: Exam: Gen: NAD, conversant, well nourised, obese, well groomed                     CV: RRR, no MRG. No Carotid Bruits. No peripheral edema, warm, nontender Eyes: Conjunctivae clear without exudates or hemorrhage  Neuro: Detailed Neurologic Exam  Speech:    Speech is normal; fluent and spontaneous with normal comprehension.  Cognition:    The patient is oriented to person, place, and time;     recent and remote memory intact;     language fluent;     normal attention, concentration,     fund of knowledge Cranial Nerves:    The pupils are equal, round, and reactive to light. The fundi are normal and spontaneous venous pulsations are present. Visual fields are full to finger confrontation. Extraocular movements are intact. Trigeminal sensation is intact and the muscles of mastication are normal. The face is symmetric. The palate elevates in the midline. Hearing intact. Voice is normal. Shoulder shrug is normal. The tongue has normal motion without fasciculations.   Coordination:    Normal finger to nose and heel to shin. Normal rapid alternating movements.   Gait:    Heel-toe normal, imbalance with tandem.   Motor Observation:    No asymmetry, no atrophy, and no involuntary movements noted. Tone:    Normal muscle tone.    Posture:    Posture is normal. normal erect    Strength:    Strength is V/V in the upper and lower limbs.      Sensation: decreased to LT, pinprick, vibration and propriocetion distally in the feet. +romberg     Reflex Exam:  DTR's:    Absent AJs. Deep tendon reflexes in the upper and lower extremities are normal bilaterally.   Toes:    The toes are downgoing bilaterally.   Clonus:    Clonus is absent.    Assessment/Plan:  Benjamin [name] is a 73 y.o. male here as requested by Alvan Dorn FALCON, MD for dizziness. has Allergy; Myocardial infarction [facility]); ASCVD (arteriosclerotic cardiovascular disease); Chronic  systolic heart failure (HCC); CAD in native artery; OSA (obstructive sleep apnea); Benign essential HTN; and Obesity (BMI 30-39.9) on their problem list.  States dizziness all the time vs imbalance vs lightheadedness.   He has numbness in the feet , +romberg, imbalance may be due to neuropathy due to impaired proprioception but we will check brain, cervical spine : ris factors include prior B12 deficiency, pre-diabetes, Vestibular rehab, MRI brain/MRA head  Orders Placed This Encounter  Procedures   MR BRAIN W WO CONTRAST   MR CERVICAL SPINE WO CONTRAST   Vitamin B1   Vitamin B6   Sjogren's syndrome antibods(ssa + ssb)   Multiple Myeloma Panel (SPEP&IFE w/QIG)   TSH   Basic Metabolic Panel   Ambulatory referral to Physical Therapy   No orders of the defined types were placed in this encounter.   Cc: Alvan Dorn FALCON, MD,  Nanci Senior, MD  Onetha Epp, MD  [facility] [address] [place], [place] [address]  Phone [phone] Fax [phone]

## 2023-08-13 NOTE — Telephone Encounter (Signed)
 Referral for physical therapy fax to [facility]. Phone: [phone], Fax: [phone]

## 2023-08-13 NOTE — Patient Instructions (Addendum)
 May want to ask your sleep doctor about Zepbound  which is approved in moderate to severe sleep apnea  MRI brain and cervical spine  Blood work for neuropathy  Physical therapy for imbalance (live in [place] please find PT nearby)  Follow up with me as needed

## 2023-08-15 ENCOUNTER — Telehealth: Payer: Self-pay | Admitting: Pharmacy Technician

## 2023-08-15 ENCOUNTER — Other Ambulatory Visit (HOSPITAL_COMMUNITY): Payer: Self-pay

## 2023-08-15 NOTE — Telephone Encounter (Signed)
 Hi, in his chart it had it listed he was diabetic and he had a A1c of 6.6 in his history. His insurance denied this prior authorization due to that. Insurance said they would pay for mounjaro . Per test claim he can get mounjaro  for free.

## 2023-08-15 NOTE — Telephone Encounter (Signed)
 From pt advice  Pharmacy Patient Advocate Encounter   Received notification from Patient Advice Request messages that prior authorization for zepbound  is required/requested.   Insurance verification completed.   The patient is insured through [place] .   Per test claim: PA required; PA submitted to above mentioned insurance via Latent Key/confirmation #/EOC [id number] Status is pending

## 2023-08-19 ENCOUNTER — Ambulatory Visit: Payer: Self-pay | Admitting: Neurology

## 2023-08-19 ENCOUNTER — Telehealth: Payer: Self-pay | Admitting: Neurology

## 2023-08-19 NOTE — Telephone Encounter (Signed)
 Scheduled at GI for 8/29 Mylene barrows: [id number] exp. 08/19/23-10/18/23

## 2023-08-21 LAB — BASIC METABOLIC PANEL WITH GFR
BUN/Creatinine Ratio: 12 (ref 10–24)
BUN: 11 mg/dL (ref 8–27)
CO2: 22 mmol/L (ref 20–29)
Calcium: 9.1 mg/dL (ref 8.6–10.2)
Chloride: 103 mmol/L (ref 96–106)
Creatinine, Ser: 0.95 mg/dL (ref 0.76–1.27)
Glucose: 96 mg/dL (ref 70–99)
Potassium: 4.7 mmol/L (ref 3.5–5.2)
Sodium: 140 mmol/L (ref 134–144)
eGFR: 84 mL/min/1.73 (ref >59)

## 2023-08-21 LAB — MULTIPLE MYELOMA PANEL, SERUM
Albumin SerPl Elph-Mcnc: 3.5 g/dL (ref 2.9–4.4)
Albumin/Glob SerPl: 1.2 (ref 0.7–1.7)
Alpha 1: 0.2 g/dL (ref 0.0–0.4)
Alpha2 Glob SerPl Elph-Mcnc: 0.9 g/dL (ref 0.4–1.0)
B-Globulin SerPl Elph-Mcnc: 1.1 g/dL (ref 0.7–1.3)
Gamma Glob SerPl Elph-Mcnc: 0.8 g/dL (ref 0.4–1.8)
Globulin, Total: 3.1 g/dL (ref 2.2–3.9)
IgA/Immunoglobulin A, Serum: 266 mg/dL (ref 61–437)
IgG (Immunoglobin G), Serum: 814 mg/dL (ref 603–1613)
IgM (Immunoglobulin M), Srm: 71 mg/dL (ref 15–143)
Total Protein: 6.6 g/dL (ref 6.0–8.5)

## 2023-08-21 LAB — TSH: TSH: 1.63 u[IU]/mL (ref 0.450–4.500)

## 2023-08-21 LAB — SJOGREN'S SYNDROME ANTIBODS(SSA + SSB)
ENA SSA (RO) Ab: <0.2 AI (ref 0.0–0.9)
ENA SSB (LA) Ab: <0.2 AI (ref 0.0–0.9)

## 2023-08-21 LAB — VITAMIN B6: Vitamin B6: 3.6 ug/L (ref 3.4–65.2)

## 2023-08-21 LAB — VITAMIN B1: Thiamine: 119.5 nmol/L (ref 66.5–200.0)

## 2023-08-23 ENCOUNTER — Other Ambulatory Visit (HOSPITAL_BASED_OUTPATIENT_CLINIC_OR_DEPARTMENT_OTHER): Payer: Self-pay

## 2023-08-23 MED ORDER — MOUNJARO 2.5 MG/0.5ML ~~LOC~~ SOAJ
2.5000 mg | SUBCUTANEOUS | 0 refills | Status: DC
  Filled 2023-08-23: qty 2, 28d supply, fill #0

## 2023-08-23 NOTE — Addendum Note (Signed)
 Addended by: DARRELL BRUCKNER on: 08/23/2023 03:21 PM   Modules accepted: Orders

## 2023-08-30 ENCOUNTER — Ambulatory Visit
Admission: RE | Admit: 2023-08-30 | Discharge: 2023-08-30 | Disposition: A | Discharge status: Home / Self Care | Payer: PRIVATE HEALTH INSURANCE | Source: Ambulatory Visit | Attending: Neurology | Admitting: Neurology

## 2023-08-30 DIAGNOSIS — R200 Anesthesia of skin: Secondary | ICD-10-CM

## 2023-08-30 DIAGNOSIS — R29898 Other symptoms and signs involving the musculoskeletal system: Secondary | ICD-10-CM

## 2023-08-30 DIAGNOSIS — R2689 Other abnormalities of gait and mobility: Secondary | ICD-10-CM

## 2023-08-30 DIAGNOSIS — R269 Unspecified abnormalities of gait and mobility: Secondary | ICD-10-CM

## 2023-08-30 DIAGNOSIS — R270 Ataxia, unspecified: Secondary | ICD-10-CM

## 2023-08-30 MED ORDER — GADOPICLENOL 0.5 MMOL/ML IV SOLN
10.0000 mL | Freq: Once | INTRAVENOUS | Status: AC | PRN
  Administered 2023-08-30: 10 mL via INTRAVENOUS

## 2023-09-10 DIAGNOSIS — G4733 Obstructive sleep apnea (adult) (pediatric): Secondary | ICD-10-CM | POA: Diagnosis not present

## 2023-09-16 ENCOUNTER — Other Ambulatory Visit (HOSPITAL_BASED_OUTPATIENT_CLINIC_OR_DEPARTMENT_OTHER): Payer: Self-pay

## 2023-09-20 ENCOUNTER — Other Ambulatory Visit (HOSPITAL_COMMUNITY): Payer: Self-pay

## 2023-09-20 ENCOUNTER — Other Ambulatory Visit (HOSPITAL_BASED_OUTPATIENT_CLINIC_OR_DEPARTMENT_OTHER): Payer: Self-pay

## 2023-09-20 ENCOUNTER — Other Ambulatory Visit: Payer: Self-pay

## 2023-09-20 ENCOUNTER — Other Ambulatory Visit: Payer: Self-pay | Admitting: Cardiology

## 2023-09-20 DIAGNOSIS — E119 Type 2 diabetes mellitus without complications: Secondary | ICD-10-CM

## 2023-09-23 ENCOUNTER — Other Ambulatory Visit (HOSPITAL_BASED_OUTPATIENT_CLINIC_OR_DEPARTMENT_OTHER): Payer: Self-pay

## 2023-09-23 MED ORDER — MOUNJARO 5 MG/0.5ML ~~LOC~~ SOAJ
5.0000 mg | SUBCUTANEOUS | 0 refills | Status: DC
  Filled 2023-09-23: qty 2, 28d supply, fill #0

## 2023-09-24 ENCOUNTER — Other Ambulatory Visit (HOSPITAL_BASED_OUTPATIENT_CLINIC_OR_DEPARTMENT_OTHER): Payer: Self-pay

## 2023-09-27 ENCOUNTER — Encounter: Payer: Self-pay | Admitting: Family Medicine

## 2023-09-27 ENCOUNTER — Ambulatory Visit (INDEPENDENT_AMBULATORY_CARE_PROVIDER_SITE_OTHER): Payer: PRIVATE HEALTH INSURANCE | Admitting: Family Medicine

## 2023-09-27 VITALS — BP 120/62 | HR 60 | Temp 98.0°F | Ht 70.0 in | Wt 257.0 lb

## 2023-09-27 DIAGNOSIS — I10 Essential (primary) hypertension: Secondary | ICD-10-CM | POA: Diagnosis not present

## 2023-09-27 DIAGNOSIS — I5022 Chronic systolic (congestive) heart failure: Secondary | ICD-10-CM | POA: Diagnosis not present

## 2023-09-27 DIAGNOSIS — R972 Elevated prostate specific antigen [PSA]: Secondary | ICD-10-CM

## 2023-09-27 DIAGNOSIS — E7800 Pure hypercholesterolemia, unspecified: Secondary | ICD-10-CM | POA: Diagnosis not present

## 2023-09-27 DIAGNOSIS — G4733 Obstructive sleep apnea (adult) (pediatric): Secondary | ICD-10-CM

## 2023-09-27 DIAGNOSIS — E1159 Type 2 diabetes mellitus with other circulatory complications: Secondary | ICD-10-CM

## 2023-09-27 DIAGNOSIS — I2510 Atherosclerotic heart disease of native coronary artery without angina pectoris: Secondary | ICD-10-CM | POA: Diagnosis not present

## 2023-09-27 DIAGNOSIS — Z0001 Encounter for general adult medical examination with abnormal findings: Secondary | ICD-10-CM

## 2023-09-27 DIAGNOSIS — Z1211 Encounter for screening for malignant neoplasm of colon: Secondary | ICD-10-CM | POA: Insufficient documentation

## 2023-09-27 DIAGNOSIS — Z23 Encounter for immunization: Secondary | ICD-10-CM

## 2023-09-27 MED ORDER — SACUBITRIL-VALSARTAN 49-51 MG PO TABS: 1.0000 | ORAL_TABLET | Freq: Two times a day (BID) | ORAL | 3 refills | Status: AC

## 2023-09-27 NOTE — Addendum Note (Signed)
 Addended by: ANGELENA RONAL BRADLEY K on: 09/27/2023 09:02 AM   Modules accepted: Orders

## 2023-09-27 NOTE — Progress Notes (Signed)
 Subjective:    Patient ID: Benjamin [name], male    DOB: February 12, 1950, 73 y.o.   MRN: [id number]  Patient is here today for complete physical exam.  I obtained a PSA in May of this year which was 3.4.  While within normal range this was up considerably from his 1.2 which was his last PSA on record.  However that was another.  I recommended that we monitor this more closely.  Today he brings in a PSA of 1.44 that was checked in 2024 by a different physician.  Therefore, the 3.4-1.7 increased.  He does report increased urinary frequency which he attributed to the Farxiga .  He recently had labs evaluating neuropathy including thiamine, B6, SPEP, ANA all of which were within normal limits.  His last hemoglobin A1c was 6.2 in May.  Past medical history is significant for coronary artery disease, congestive heart failure, hypertension, hyperlipidemia.  Last colonoscopy was 2023.  Repeat colonoscopy was not recommended.  Last echocardiogram was in 2024 which showed ejection fraction of 35%.  He is currently on low-dose Entresto , spironolactone , and Farxiga  Immunization History  Administered Date(s) Administered   Hepatitis B 05/18/2011, 01/26/2014, 02/23/2014   INFLUENZA, HIGH DOSE SEASONAL PF 08/30/2018   Influenza-Unspecified 10/12/2012, 10/01/2017, 11/09/2020   Moderna Sars-Covid-2 Vaccination 02/26/2019, 04/07/2019, 11/02/2019   Pneumococcal Conjugate-13 04/18/2017   Pneumococcal Polysaccharide-23 11/24/2014   Zoster Recombinant(Shingrix) 01/09/2020, 04/01/2020     Past Medical History:  Diagnosis Date   Allergy    ASCVD (arteriosclerotic cardiovascular disease)    Diabetes mellitus without complication (HCC)    prediabetes   HFrEF (heart failure with reduced ejection fraction) (HCC)    Hyperlipidemia    Hypertension    Left ventricular apical thrombus    Myocardial infarction (HCC) 04/20/1999   Obesity (BMI 30-39.9)    OSA (obstructive sleep apnea) 11/02/2015   Past Surgical History:   Procedure Laterality Date   CARDIAC CATHETERIZATION N/A 05/19/2015   Procedure: Right/Left Heart Cath and Coronary Angiography;  Surgeon: Victory LELON Sharps, MD;  Location: [facility]VASIVE CV LAB;  Service: Cardiovascular;  Laterality: N/A;   CORONARY STENT PLACEMENT  04/18/1999   RIGHT/LEFT HEART CATH AND CORONARY ANGIOGRAPHY N/A 06/21/2021   Procedure: RIGHT/LEFT HEART CATH AND CORONARY ANGIOGRAPHY;  Surgeon: Wendel Lurena POUR, MD;  Location: [facility] INVASIVE CV LAB;  Service: Cardiovascular;  Laterality: N/A;   Current Outpatient Medications on File Prior to Visit  Medication Sig Dispense Refill   acetaminophen  (TYLENOL ) 500 MG tablet Take 1,000 mg by mouth every 6 (six) hours as needed for moderate pain.     aspirin  EC 81 MG tablet Take 1 tablet (81 mg total) by mouth daily. Swallow whole. Start after eliquis  is stopped 90 tablet 3   atorvastatin  (LIPITOR) 80 MG tablet TAKE ONE TABLET BY MOUTH EVERY EVENING AT 6PM 90 tablet 3   cholecalciferol (VITAMIN D3) 25 MCG (1000 UNIT) tablet Take 1,000 Units by mouth daily.     FARXIGA  10 MG TABS tablet TAKE 1 TABLET BY MOUTH DAILY BEFORE BREAKFAST 90 tablet 3   sacubitril -valsartan  (ENTRESTO ) 24-26 MG Take 1 tablet by mouth 2 (two) times daily. 180 tablet 3   spironolactone  (ALDACTONE ) 25 MG tablet Take 0.5 tablets (12.5 mg total) by mouth daily. 45 tablet 3   tirzepatide  (MOUNJARO ) 5 MG/0.5ML Pen Inject 5 mg into the skin once a week. 2 mL 0   vitamin B-12 (CYANOCOBALAMIN ) 1000 MCG tablet Take 1,000 mcg by mouth daily.     No current facility-administered medications  on file prior to visit.   Allergies  Allergen Reactions   Penicillins Swelling   Social History   Socioeconomic History   Marital status: Married    Spouse name: Edna   Number of children: 2   Years of education: Not on file   Highest education level: 12th grade  Occupational History   Not on file  Tobacco Use   Smoking status: Former    Current packs/day: 0.00    Average packs/day:  0.5 packs/day for 8.0 years (4.0 ttl pk-yrs)    Types: Cigarettes    Start date: 07/24/1966    Quit date: 07/24/1974    Years since quitting: 49.2    Passive exposure: Past   Smokeless tobacco: Never  Vaping Use   Vaping status: Never Used  Substance and Sexual Activity   Alcohol use: No    Alcohol/week: 0.0 standard drinks of alcohol   Drug use: No   Sexual activity: Yes    Birth control/protection: None  Other Topics Concern   Not on file  Social History Narrative   Retired    Lives with wife    Social Drivers of Corporate investment banker Strain: Low Risk  (09/20/2023)   Overall Financial Resource Strain (CARDIA)    Difficulty of Paying Living Expenses: Not very hard  Food Insecurity: No Food Insecurity (09/20/2023)   Hunger Vital Sign    Worried About Running Out of Food in the Last Year: Never true    Ran Out of Food in the Last Year: Never true  Transportation Needs: No Transportation Needs (09/20/2023)   PRAPARE - Administrator, Civil Service (Medical): No    Lack of Transportation (Non-Medical): No  Physical Activity: Inactive (09/20/2023)   Exercise Vital Sign    Days of Exercise per Week: 0 days    Minutes of Exercise per Session: Not on file  Stress: No Stress Concern Present (09/20/2023)   [employer] of Occupational Health - Occupational Stress Questionnaire    Feeling of Stress: Not at all  Social Connections: Socially Isolated (09/20/2023)   Social Connection and Isolation Panel    Frequency of Communication with Friends and Family: Once a week    Frequency of Social Gatherings with Friends and Family: Once a week    Attends Religious Services: Never    Database administrator or Organizations: No    Attends Engineer, structural: Not on file    Marital Status: Married  Intimate Partner Violence: Unknown (03/09/2021)   Humiliation, Afraid, Rape, and Kick questionnaire    Fear of Current or Ex-Partner: Not on file    Emotionally  Abused: No    Physically Abused: No    Sexually Abused: No     Review of Systems  Genitourinary:  Positive for frequency.  All other systems reviewed and are negative.      Objective:   Physical Exam Vitals reviewed.  Constitutional:      Appearance: He is well-developed.  HENT:     Nose: No mucosal edema or rhinorrhea.     Right Sinus: No maxillary sinus tenderness or frontal sinus tenderness.     Left Sinus: No maxillary sinus tenderness or frontal sinus tenderness.     Mouth/Throat:     Pharynx: No oropharyngeal exudate.  Eyes:     Conjunctiva/sclera: Conjunctivae normal.     Pupils: Pupils are equal, round, and reactive to light.  Cardiovascular:     Rate and Rhythm: Normal rate  and regular rhythm.     Heart sounds: Normal heart sounds. No murmur heard. Pulmonary:     Effort: Pulmonary effort is normal. No respiratory distress.     Breath sounds: Normal breath sounds. No wheezing or rales.  Genitourinary:    Prostate: Normal.     Rectum: Normal.  Musculoskeletal:     Cervical back: Neck supple.  Lymphadenopathy:     Cervical: No cervical adenopathy.           Assessment & Plan:  ASCVD (arteriosclerotic cardiovascular disease)  Chronic systolic heart failure (HCC)  OSA (obstructive sleep apnea)  Benign essential HTN  Type 2 diabetes mellitus with other circulatory complication, without long-term current use of insulin (HCC) - Plan: CBC with Differential/Platelet, Comprehensive metabolic panel with GFR, Lipid panel, Microalbumin, urine, Hemoglobin A1c  Hypercholesteremia  Elevated PSA - Plan: PSA Patient received a flu shot today.  We discussed RSV.  Colonoscopy is up-to-date.  I recommended repeating the PSA to get 2 points in time.  If the PSA remains consistently elevated or worsening I would recommend urology consultation to discuss MRI versus biopsy.  If the PSA is stable or decreasing, we may choose simply to watch it.  Blood pressure today is  excellent but I would like to increase his Entresto  to 49/51 twice daily in an effort to improve his systolic function.  He is asymptomatic thankfully.  I will also get baseline lab work.

## 2023-09-28 LAB — LIPID PANEL
Cholesterol: 110 mg/dL (ref <200)
HDL: 49 mg/dL (ref >=40)
LDL Cholesterol (Calc): 45 mg/dL
Non-HDL Cholesterol (Calc): 61 mg/dL (ref <130)
Total CHOL/HDL Ratio: 2.2 (calc) (ref <5.0)
Triglycerides: 75 mg/dL (ref <150)

## 2023-09-28 LAB — COMPREHENSIVE METABOLIC PANEL WITH GFR
AG Ratio: 1.7 (calc) (ref 1.0–2.5)
ALT: 18 U/L (ref 9–46)
AST: 17 U/L (ref 10–35)
Albumin: 4.1 g/dL (ref 3.6–5.1)
Alkaline phosphatase (APISO): 103 U/L (ref 35–144)
BUN: 13 mg/dL (ref 7–25)
CO2: 28 mmol/L (ref 20–32)
Calcium: 8.8 mg/dL (ref 8.6–10.3)
Chloride: 103 mmol/L (ref 98–110)
Creat: 1 mg/dL (ref 0.70–1.28)
Globulin: 2.4 g/dL (ref 1.9–3.7)
Glucose, Bld: 89 mg/dL (ref 65–99)
Potassium: 4.2 mmol/L (ref 3.5–5.3)
Sodium: 138 mmol/L (ref 135–146)
Total Bilirubin: 0.9 mg/dL (ref 0.2–1.2)
Total Protein: 6.5 g/dL (ref 6.1–8.1)
eGFR: 79 mL/min/1.73m2 (ref >=60)

## 2023-09-28 LAB — CBC WITH DIFFERENTIAL/PLATELET
Absolute Lymphocytes: 2285 {cells}/uL (ref 850–3900)
Absolute Monocytes: 610 {cells}/uL (ref 200–950)
Basophils Absolute: 60 {cells}/uL (ref 0–200)
Basophils Relative: 0.9 %
Eosinophils Absolute: 201 {cells}/uL (ref 15–500)
Eosinophils Relative: 3 %
HCT: 51.8 % — ABNORMAL HIGH (ref 38.5–50.0)
Hemoglobin: 16.9 g/dL (ref 13.2–17.1)
MCH: 28.5 pg (ref 27.0–33.0)
MCHC: 32.6 g/dL (ref 32.0–36.0)
MCV: 87.5 fL (ref 80.0–100.0)
MPV: 9.8 fL (ref 7.5–12.5)
Monocytes Relative: 9.1 %
Neutro Abs: 3544 {cells}/uL (ref 1500–7800)
Neutrophils Relative %: 52.9 %
Platelets: 259 Thousand/uL (ref 140–400)
RBC: 5.92 Million/uL — ABNORMAL HIGH (ref 4.20–5.80)
RDW: 13.6 % (ref 11.0–15.0)
Total Lymphocyte: 34.1 %
WBC: 6.7 Thousand/uL (ref 3.8–10.8)

## 2023-09-28 LAB — MICROALBUMIN, URINE: Microalb, Ur: 0.4 mg/dL

## 2023-09-28 LAB — PSA: PSA: 1.4 ng/mL (ref <=4.00)

## 2023-09-28 LAB — HEMOGLOBIN A1C
Hgb A1c MFr Bld: 5.7 % — ABNORMAL HIGH (ref <5.7)
Mean Plasma Glucose: 117 mg/dL
eAG (mmol/L): 6.5 mmol/L

## 2023-09-30 ENCOUNTER — Ambulatory Visit: Payer: Self-pay | Admitting: Family Medicine

## 2023-10-14 ENCOUNTER — Other Ambulatory Visit: Payer: Self-pay | Admitting: Cardiology

## 2023-10-14 ENCOUNTER — Other Ambulatory Visit (HOSPITAL_BASED_OUTPATIENT_CLINIC_OR_DEPARTMENT_OTHER): Payer: Self-pay

## 2023-10-14 DIAGNOSIS — E119 Type 2 diabetes mellitus without complications: Secondary | ICD-10-CM

## 2023-10-15 ENCOUNTER — Other Ambulatory Visit (HOSPITAL_BASED_OUTPATIENT_CLINIC_OR_DEPARTMENT_OTHER): Payer: Self-pay

## 2023-10-15 MED ORDER — TIRZEPATIDE 7.5 MG/0.5ML ~~LOC~~ SOAJ
7.5000 mg | SUBCUTANEOUS | 0 refills | Status: DC
  Filled 2023-10-15: qty 2, 28d supply, fill #0

## 2023-11-05 ENCOUNTER — Other Ambulatory Visit: Payer: PRIVATE HEALTH INSURANCE

## 2023-11-06 ENCOUNTER — Ambulatory Visit (INDEPENDENT_AMBULATORY_CARE_PROVIDER_SITE_OTHER)

## 2023-11-06 VITALS — Ht 70.0 in | Wt 255.0 lb

## 2023-11-06 DIAGNOSIS — Z0000 Encounter for general adult medical examination without abnormal findings: Secondary | ICD-10-CM | POA: Diagnosis not present

## 2023-11-06 NOTE — Progress Notes (Cosign Needed Addendum)
 Subjective:   Benjamin [name] is a 74 y.o. male who presents for a Medicare Annual Wellness Visit.  Visit Complete: Virtual I connected with this patient by a audio enabled telemedicine application and verified that I am speaking with the correct person using two identifiers.  Patient Location: Home Provider Location: Home Office  I discussed the limitations of evaluation and management by telemedicine. The patient expressed understanding and agreed to proceed.  Persons Participating in Visit: Patient   Allergies (verified) Penicillins   History: Past Medical History:  Diagnosis Date   Allergy    ASCVD (arteriosclerotic cardiovascular disease)    Diabetes mellitus without complication (HCC)    prediabetes   HFrEF (heart failure with reduced ejection fraction) (HCC)    Hyperlipidemia    Hypertension    Left ventricular apical thrombus    Myocardial infarction (HCC) 04/20/1999   Obesity (BMI 30-39.9)    OSA (obstructive sleep apnea) 11/02/2015   Sleep apnea    Past Surgical History:  Procedure Laterality Date   CARDIAC CATHETERIZATION N/A 05/19/2015   Procedure: Right/Left Heart Cath and Coronary Angiography;  Surgeon: Victory LELON Sharps, MD;  Location: [facility] INVASIVE CV LAB;  Service: Cardiovascular;  Laterality: N/A;   CORONARY STENT PLACEMENT  04/18/1999   RIGHT/LEFT HEART CATH AND CORONARY ANGIOGRAPHY N/A 06/21/2021   Procedure: RIGHT/LEFT HEART CATH AND CORONARY ANGIOGRAPHY;  Surgeon: Wendel Lurena POUR, MD;  Location: [facility] INVASIVE CV LAB;  Service: Cardiovascular;  Laterality: N/A;   Family History  Problem Relation Age of Onset   AAA (abdominal aortic aneurysm) Father    Social History   Occupational History   Not on file  Tobacco Use   Smoking status: Former    Current packs/day: 0.00    Average packs/day: 0.5 packs/day for 8.0 years (4.0 ttl pk-yrs)    Types: Cigarettes    Start date: 07/24/1966    Quit date: 07/24/1974    Years since quitting: 49.3    Passive  exposure: Past   Smokeless tobacco: Never   Tobacco comments:    Quit in my early twenties  Vaping Use   Vaping status: Never Used  Substance and Sexual Activity   Alcohol use: No    Alcohol/week: 0.0 standard drinks of alcohol   Drug use: No   Sexual activity: Yes    Birth control/protection: None   Tobacco Counseling Counseling given: Not Answered Tobacco comments: Quit in my early twenties  SDOH Screenings   Food Insecurity: No Food Insecurity (11/06/2023)  Housing: Low Risk  (11/06/2023)  Transportation Needs: No Transportation Needs (11/04/2023)  Utilities: Not At Risk (11/06/2023)  Alcohol Screen: Low Risk  (11/04/2023)  Depression (PHQ2-9): Low Risk  (11/06/2023)  Financial Resource Strain: Low Risk  (11/04/2023)  Physical Activity: Inactive (11/06/2023)  Social Connections: Socially Isolated (11/06/2023)  Stress: No Stress Concern Present (11/04/2023)  Tobacco Use: Medium Risk (11/06/2023)  Health Literacy: Adequate Health Literacy (11/06/2023)   Depression Screen    11/06/2023    9:59 AM 09/27/2023    9:01 AM 03/09/2021    2:14 PM 05/13/2020    2:07 PM 03/19/2017    3:02 PM 03/11/2017    9:44 AM 10/17/2015    9:58 AM  PHQ 2/9 Scores  PHQ - 2 Score 0 0 0 0 0 0 0     Goals Addressed             This Visit's Progress    Weight (lb) < 200 lb (90.7 kg)   255 lb (  115.7 kg)    I would like to use 40-50lbs       Visit info / Clinical Intake: Medicare Wellness Visit Type:: Subsequent Annual Wellness Visit Medicare Wellness Visit Mode:: Telephone If telephone:: video declined If telephone or video:: unable to obtan vitals due to lack of equipment Interpreter Needed?: No Pre-visit prep was completed: no AWV questionnaire completed by patient prior to visit?: yes Living arrangements:: lives with spouse/significant other Patient's Overall Health Status Rating: good Typical amount of pain: none Does pain affect daily life?: no Are you currently prescribed opioids?:  no  Dietary Habits and Nutritional Risks How many meals a day?: (!) 1 Eats fruit and vegetables daily?: (!) no Most meals are obtained by: preparing own meals Diabetic:: no  Functional Status Activities of Daily Living (to include ambulation/medication): Independent Ambulation: Independent Medication Administration: Independent Home Management: Independent Manage your own finances?: yes Primary transportation is: driving Concerns about vision?: no *vision screening is required for WTM* (readers only) Concerns about hearing?: no  Fall Screening Falls in the past year?: 0 Number of falls in past year: 0 Was there an injury with Fall?: 0 Fall Risk Category Calculator: 0 Patient Fall Risk Level: Low Fall Risk  Fall Risk Patient at Risk for Falls Due to: No Fall Risks Fall risk Follow up: Education provided; Falls prevention discussed  Home and Transportation Safety: All rugs have non-skid backing?: yes All stairs or steps have railings?: yes Grab bars in the bathtub or shower?: (!) no (enclosed walk in shower) Have non-skid surface in bathtub or shower?: (!) no Good home lighting?: yes Regular seat belt use?: yes Hospital stays in the last year:: no  Cognitive Assessment Difficulty concentrating, remembering, or making decisions? : yes Will 6CIT or Mini Cog be Completed: yes What year is it?: 0 points What month is it?: 0 points Give patient an address phrase to remember (5 components): [address] [place]  About what time is it?: 0 points Count backwards from 20 to 1: 0 points Say the months of the year in reverse: 0 points Repeat the address phrase from earlier: 0 points 6 CIT Score: 0 points  Advance Directives (For Healthcare) Does Patient Have a Medical Advance Directive?: No  Reviewed/Updated  Reviewed/Updated: All        Objective:    Today's Vitals   11/06/23 0938  Weight: 255 lb (115.7 kg)  Height: 5' 10 (1.778 m)   Body mass index is  36.59 kg/m.  Current Medications (verified) Outpatient Encounter Medications as of 11/06/2023  Medication Sig   acetaminophen  (TYLENOL ) 500 MG tablet Take 1,000 mg by mouth every 6 (six) hours as needed for moderate pain.   aspirin  EC 81 MG tablet Take 1 tablet (81 mg total) by mouth daily. Swallow whole. Start after eliquis  is stopped   atorvastatin  (LIPITOR) 80 MG tablet TAKE ONE TABLET BY MOUTH EVERY EVENING AT 6PM   cholecalciferol (VITAMIN D3) 25 MCG (1000 UNIT) tablet Take 1,000 Units by mouth daily.   FARXIGA  10 MG TABS tablet TAKE 1 TABLET BY MOUTH DAILY BEFORE BREAKFAST   sacubitril -valsartan  (ENTRESTO ) 49-51 MG Take 1 tablet by mouth 2 (two) times daily.   spironolactone  (ALDACTONE ) 25 MG tablet Take 0.5 tablets (12.5 mg total) by mouth daily.   tirzepatide  (MOUNJARO ) 7.5 MG/0.5ML Pen Inject 7.5 mg into the skin once a week.   vitamin B-12 (CYANOCOBALAMIN ) 1000 MCG tablet Take 1,000 mcg by mouth daily.   No facility-administered encounter medications on file as of  11/06/2023.   Hearing/Vision screen Vision Screening - Comments:: Has not done been to eye dr in long time-declines visits Immunizations and Health Maintenance Health Maintenance  Topic Date Due   Diabetic kidney evaluation - Urine ACR  Never done   COVID-19 Vaccine (4 - 2025-26 season) 09/02/2023   OPHTHALMOLOGY EXAM  01/27/2024 (Originally 04/13/2021)   Hepatitis C Screening  09/26/2024 (Originally 07/24/1968)   HEMOGLOBIN A1C  03/26/2024   Diabetic kidney evaluation - eGFR measurement  09/26/2024   FOOT EXAM  09/26/2024   Medicare Annual Wellness (AWV)  09/26/2024   Colonoscopy  10/13/2031   DTaP/Tdap/Td (2 - Td or Tdap) 07/22/2032   Pneumococcal Vaccine: 50+ Years  Completed   Influenza Vaccine  Completed   Zoster Vaccines- Shingrix  Completed   Meningococcal B Vaccine  Aged Out   Hepatitis B Vaccines 19-59 Average Risk  Discontinued        Assessment/Plan:  This is a routine wellness examination for  Benjamin [name].  Patient Care Team: Duanne Butler DASEN, MD as PCP - General (Family Medicine) Alvan Dorn FALCON, MD as PCP - Cardiology (Cardiology)  I have personally reviewed and noted the following in the patient's chart:   Medical and social history Use of alcohol, tobacco or illicit drugs  Current medications and supplements including opioid prescriptions. Functional ability and status Nutritional status Physical activity Advanced directives List of other physicians Hospitalizations, surgeries, and ER visits in previous 12 months Vitals Screenings to include cognitive, depression, and falls Referrals and appointments  No orders of the defined types were placed in this encounter.  In addition, I have reviewed and discussed with patient certain preventive protocols, quality metrics, and best practice recommendations. A written personalized care plan for preventive services as well as general preventive health recommendations were provided to patient.   Benjamin LITTIE Saris, LPN   88/04/[id number]   No follow-ups on file.  After Visit Summary: (MyChart) Due to this being a telephonic visit, the after visit summary with patients personalized plan was offered to patient via MyChart   Nurse Notes: Pt has questions/concerns about what is causing his continued light-headedness and why not losing weight on Mounjaro . He indicates he will reach out to dr that started the Mounjaro . Next AWV made for one year

## 2023-11-06 NOTE — Patient Instructions (Signed)
 Mr. Gronau,  Thank you for taking the time for your Medicare Wellness Visit. I appreciate your continued commitment to your health goals. Please review the care plan we discussed, and feel free to reach out if I can assist you further.  Please note that Annual Wellness Visits do not include a physical exam. Some assessments may be limited, especially if the visit was conducted virtually. If needed, we may recommend an in-person follow-up with your provider.  Ongoing Care Seeing your primary care provider every 3 to 6 months helps us  monitor your health and provide consistent, personalized care.   Referrals If a referral was made during today's visit and you haven't received any updates within two weeks, please contact the referred provider directly to check on the status.  Recommended Screenings:  Health Maintenance  Topic Date Due   Yearly kidney health urinalysis for diabetes  Never done   COVID-19 Vaccine (4 - 2025-26 season) 09/02/2023   Eye exam for diabetics  01/27/2024*   Hepatitis C Screening  09/26/2024*   Hemoglobin A1C  03/26/2024   Yearly kidney function blood test for diabetes  09/26/2024   Complete foot exam   09/26/2024   Medicare Annual Wellness Visit  09/26/2024   Colon Cancer Screening  10/13/2031   DTaP/Tdap/Td vaccine (2 - Td or Tdap) 07/22/2032   Pneumococcal Vaccine for age over 67  Completed   Flu Shot  Completed   Zoster (Shingles) Vaccine  Completed   Meningitis B Vaccine  Aged Out   Hepatitis B Vaccine  Discontinued  *Topic was postponed. The date shown is not the original due date.       06/26/2021   10:41 PM  Advanced Directives  Does Patient Have a Medical Advance Directive? No    Vision: Annual vision screenings are recommended for early detection of glaucoma, cataracts, and diabetic retinopathy. These exams can also reveal signs of chronic conditions such as diabetes and high blood pressure.  Dental: Annual dental screenings help detect early  signs of oral cancer, gum disease, and other conditions linked to overall health, including heart disease and diabetes.

## 2023-11-13 ENCOUNTER — Other Ambulatory Visit (HOSPITAL_BASED_OUTPATIENT_CLINIC_OR_DEPARTMENT_OTHER): Payer: Self-pay

## 2023-11-13 ENCOUNTER — Other Ambulatory Visit: Payer: Self-pay | Admitting: Cardiology

## 2023-11-13 DIAGNOSIS — E119 Type 2 diabetes mellitus without complications: Secondary | ICD-10-CM

## 2023-11-13 MED ORDER — MOUNJARO 10 MG/0.5ML ~~LOC~~ SOAJ
10.0000 mg | SUBCUTANEOUS | 0 refills | Status: DC
  Filled 2023-11-13: qty 2, 28d supply, fill #0

## 2023-11-15 ENCOUNTER — Encounter: Payer: Self-pay | Admitting: Cardiology

## 2023-11-15 ENCOUNTER — Ambulatory Visit: Payer: PRIVATE HEALTH INSURANCE | Attending: Cardiology | Admitting: Cardiology

## 2023-11-15 VITALS — BP 98/70 | HR 63 | Ht 70.0 in | Wt 255.0 lb

## 2023-11-15 DIAGNOSIS — E782 Mixed hyperlipidemia: Secondary | ICD-10-CM | POA: Diagnosis not present

## 2023-11-15 DIAGNOSIS — I2510 Atherosclerotic heart disease of native coronary artery without angina pectoris: Secondary | ICD-10-CM | POA: Diagnosis not present

## 2023-11-15 DIAGNOSIS — R42 Dizziness and giddiness: Secondary | ICD-10-CM

## 2023-11-15 DIAGNOSIS — I5022 Chronic systolic (congestive) heart failure: Secondary | ICD-10-CM | POA: Diagnosis not present

## 2023-11-15 NOTE — Progress Notes (Signed)
 Clinical Summary Mr. Ran is a 73 y.o.male seen today for follow up of the following medical problems.  1.Lightheadedness - mentioned last visit. Somewhat limited oral hydration at the time, recommended increasing hydration and monitoring symptoms - ongoing symptoms despite hydration, tried lowering and then holding entresto  for a period without improvement - - EKG sinus brady 59, LAFB - orthostatic vitals are negative - only occurs with standing. Often with leanging -  no presyncope, syncope No other associated symptoms - [place] x 3-4 per day  - does report history of neuropathy.     -tried holding farxiga , then aldactone  for a week which neither helped symptoms - referred to neuro  - ongoing symptoms. Mainly coffee, tea, pepsi. One water a day.        2. CAD - previous notes mention MI in 2001 at [facility], cannot find cath report. Patient has stent card that shows he received a stent to his LAD at that time.   - repeat cath 05/2015 that showed patent coronaries.  This was done after he was found to have a drop in LVEF 06/2021 RHC/LHC: mild to mod CAD, CI 3.6, PCWP 9, mean PA 19   - no chest pains, no SOB/DOE       3. Chronic systolic heart failure/NICM -  05/2015 echo showed an LVEF 25-30%, this is a new finding of systolic dysfunction. - 05/2015 cath showed patent coronaries. Normal filling pressures at that time.    - repeat echo 09/2015, LVEF improved to 45-50%. Grade I diasotlic dysfunction.    - coreg  dosing has been limited by low bp's and HRs - 05/2021 echo LVEF 25-30%, indicating recurrent systolic dysfunction     06/2021 RHC/LHC: mild to mod CAD, CI 3.6, PCWP 9, mean PA 19 10/2021 cMRI: LVEF 35%, possible very small LV apical thrombus. LGE suggests prior LAD infarct.  Jan 2024 limited echo: LVEF 35% - did not tolerate coreg  due to bradycardia    - no recent edema      3. OSA  - followed by Dr Shlomo, due for f/u - remain compliant with  cpap.    4. HLD -04/2017 lipids at goal, compliant with statin.    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  Past Medical History:  Diagnosis Date   Allergy    ASCVD (arteriosclerotic cardiovascular disease)    Diabetes mellitus without complication (HCC)    prediabetes   HFrEF (heart failure with reduced ejection fraction) (HCC)    Hyperlipidemia    Hypertension    Left ventricular apical thrombus    Myocardial infarction (HCC) 04/20/1999   Obesity (BMI 30-39.9)    OSA (obstructive sleep apnea) 11/02/2015   Sleep apnea      Allergies  Allergen Reactions   Penicillins Swelling     Current Outpatient Medications  Medication Sig Dispense Refill   acetaminophen  (TYLENOL ) 500 MG tablet Take 1,000 mg by mouth every 6 (six) hours as needed for moderate pain.     aspirin  EC 81 MG tablet Take 1 tablet (81 mg total) by mouth daily. Swallow whole. Start after eliquis  is stopped 90 tablet 3   atorvastatin  (  LIPITOR) 80 MG tablet TAKE ONE TABLET BY MOUTH EVERY EVENING AT 6PM 90 tablet 3   cholecalciferol (VITAMIN D3) 25 MCG (1000 UNIT) tablet Take 1,000 Units by mouth daily.     FARXIGA  10 MG TABS tablet TAKE 1 TABLET BY MOUTH DAILY BEFORE BREAKFAST 90 tablet 3   sacubitril -valsartan  (ENTRESTO ) 49-51 MG Take 1 tablet by mouth 2 (two) times daily. 180 tablet 3   spironolactone  (ALDACTONE ) 25 MG tablet Take 0.5 tablets (12.5 mg total) by mouth daily. 45 tablet 3   tirzepatide  (MOUNJARO ) 10 MG/0.5ML Pen Inject 10 mg into the skin once a week. 2 mL 0   vitamin B-12 (CYANOCOBALAMIN ) 1000 MCG tablet Take 1,000 mcg by mouth daily.     No current facility-administered medications for this visit.     Past Surgical History:  Procedure Laterality Date   CARDIAC CATHETERIZATION N/A 05/19/2015    Procedure: Right/Left Heart Cath and Coronary Angiography;  Surgeon: Victory LELON Sharps, MD;  Location: [facility] INVASIVE CV LAB;  Service: Cardiovascular;  Laterality: N/A;   CORONARY STENT PLACEMENT  04/18/1999   RIGHT/LEFT HEART CATH AND CORONARY ANGIOGRAPHY N/A 06/21/2021   Procedure: RIGHT/LEFT HEART CATH AND CORONARY ANGIOGRAPHY;  Surgeon: Wendel Lurena POUR, MD;  Location: [facility] INVASIVE CV LAB;  Service: Cardiovascular;  Laterality: N/A;     Allergies  Allergen Reactions   Penicillins Swelling      Family History  Problem Relation Age of Onset   AAA (abdominal aortic aneurysm) Father      Social History Mr. Kaeser reports that he quit smoking about 49 years ago. His smoking use included cigarettes. He started smoking about 57 years ago. He has a 4 pack-year smoking history. He has been exposed to tobacco smoke. He has never used smokeless tobacco. Mr. Stegall reports no history of alcohol use.    Physical Examination Today's Vitals   11/15/23 0753  BP: 98/70  Pulse: 63  SpO2: 95%  Weight: 255 lb (115.7 kg)  Height: 5' 10 (1.778 m)   Body mass index is 36.59 kg/m.  Gen: resting comfortably, no acute distress HEENT: no scleral icterus, pupils equal round and reactive, no palptable cervical adenopathy,  CV: RRR, no m/rg, no jvd Resp: Clear to auscultation bilaterally GI: abdomen is soft, non-tender, non-distended, normal bowel sounds, no hepatosplenomegaly MSK: extremities are warm, no edema.  Skin: warm, no rash Neuro:  no focal deficits Psych: appropriate affect   Diagnostic Studies 05/2015 echo Study Conclusions   - Left ventricle: The cavity size was normal. Wall thickness was   normal. Systolic function was severely reduced. The estimated   ejection fraction was in the range of 25% to 30%. Diffuse   hypokinesis. Doppler parameters are consistent with abnormal left   ventricular relaxation (grade 1 diastolic dysfunction). - Regional wall motion abnormality: Akinesis  of the mid anterior,   basal-mid anteroseptal, and apical myocardium. - Aortic valve: Mildly calcified annulus. Trileaflet; mildly   thickened leaflets. Valve area (VTI): 2.42 cm^2. Valve area   (Vmax): 2.32 cm^2. - Atrial septum: No defect or patent foramen ovale was identified. - Technically difficult study. Echocontrast was used to enhance   visualization.         05/2015 cath Prox RCA to Mid RCA lesion, 20% stenosed. Ost LAD to Prox LAD lesion, 30% stenosed. The lesion was previously treated with a stent (unknown type). Dist Cx lesion, 50% stenosed.   Severe anterior apical hypokinesis. Estimated ejection fraction 35%. Normal left ventricular hemodynamics with EDP of 14  mmHg and mean capillary wedge 17 mmHg. Widely patent coronary arteries with 30% diffuse ISR in the proximal LAD stent. There is 50% eccentric stenosis in the distal circumflex. Luminal irregularities are noted in the right coronary.   Post cath RECOMMENDATIONS:   Aggressive risk factor modification to prevent progression of CAD Aggressive heart failure therapy to minimize progression of LV dysfunction.     09/2015 echo Study Conclusions   - Left ventricle: The cavity size was normal. Wall thickness was   normal. Systolic function was mildly reduced. The estimated   ejection fraction was in the range of 45% to 50%. Doppler   parameters are consistent with abnormal left ventricular   relaxation (grade 1 diastolic dysfunction). - Regional wall motion abnormality: Hypokinesis of the mid   anterior, mid anteroseptal, and apical myocardium. - Aortic valve: Mildly calcified annulus. Trileaflet; mildly   thickened leaflets. Valve area (VTI): 3.07 cm^2. Valve area   (Vmax): 2.7 cm^2. Valve area (Vmean): 2.7 cm^2. - Technically difficult study.     04/2021 echo IMPRESSIONS     1. LV endocardium poorly visualized. Roughly LVEF is in the 40-50% range.  Recommend limited study with echocontrast for more precise  estimate. .  Left ventricular ejection fraction, by estimation, is 40-50%. The left  ventricle has mildly decreased to low   normal function. Left ventricular endocardial border not optimally  defined to evaluate regional wall motion. Left ventricular diastolic  parameters are consistent with Grade I diastolic dysfunction (impaired  relaxation).   2. Right ventricular systolic function is normal. The right ventricular  size is normal. Tricuspid regurgitation signal is inadequate for assessing  PA pressure.   3. The mitral valve was not well visualized. No evidence of mitral valve  regurgitation. No evidence of mitral stenosis.   4. The aortic valve is tricuspid. Aortic valve regurgitation is not  visualized. No aortic stenosis is present.   5. The inferior vena cava is normal in size with greater than 50%  respiratory variability, suggesting right atrial pressure of 3 mmHg.   Comparison(s): LVEF 45-50%.    05/2021 limited echo 1. Limited study.   2. Left ventricular ejection fraction, by estimation, is 25 to 30%. The  left ventricle has severely decreased function. The left ventricle  demonstrates regional wall motion abnormalities (see scoring  diagram/findings for description). The left  ventricular internal cavity size was mildly dilated.   3. Definity  contrast shows slow flow and loosely organized thrombotic  material at LV apex, although no clearly formed mural thrombus.   4. Right ventricular systolic function is normal. The right ventricular  size is normal.      06/2021 RHC/LHC Dist Cx lesion is 50% stenosed.   Prox LAD lesion is 20% stenosed.   Mid LAD lesion is 20% stenosed.   LV end diastolic pressure is normal.   1.  Patent proximal LAD stent with mild in-stent restenosis and mild disease elsewhere. 2.  LVEDP of 18 mmHg. 3.  Cardiac output of 8.46 L/min and cardiac index of 3.6 L/min/m by Fick. 4.  Relatively normal filling pressures with a mean RA of 2 mmHg, mean  wedge of 9 mmHg, mean PA of 19 mmHg, with normal pulmonary artery pulsatility index.   Recommendation: Goal-directed medical therapy for nonischemic cardiomyopathy.        Assessment and Plan  1.Dizziness - symptoms only occur with standing - orthostatics have been negative today -tried holding entresto , farxiga , aldactone  at different times without improvement - seen by  neuro with ongoing workup - limited hydration despite prior disucssions, discussed daily 2L minimum of hydration noncaffeinated drinks  2. Chronic HFrEF - no symptoms, continue current meds - repeat echo, LVEF has been borderlien for ICD consideration  3. CAD - no symptoms, continue current meds  4. HLD - at goal, continue current meds     Dorn PHEBE Ross, M.D.

## 2023-11-15 NOTE — Patient Instructions (Signed)
 Medication Instructions:   Continue all current medications.   Labwork:  none  Testing/Procedures:  Your physician has requested that you have an echocardiogram. Echocardiography is a painless test that uses sound waves to create images of your heart. It provides your doctor with information about the size and shape of your heart and how well your heart's chambers and valves are working. This procedure takes approximately one hour. There are no restrictions for this procedure. Please do NOT wear cologne, perfume, aftershave, or lotions (deodorant is allowed). Please arrive 15 minutes prior to your appointment time.  Please note: We ask at that you not bring children with you during ultrasound (echo/ vascular) testing. Due to room size and safety concerns, children are not allowed in the ultrasound rooms during exams. Our front office staff cannot provide observation of children in our lobby area while testing is being conducted. An adult accompanying a patient to their appointment will only be allowed in the ultrasound room at the discretion of the ultrasound technician under special circumstances. We apologize for any inconvenience. Office will contact with results via phone, letter or mychart.     Follow-Up:  4 months   Any Other Special Instructions Will Be Listed Below (If Applicable).   If you need a refill on your cardiac medications before your next appointment, please call your pharmacy.

## 2023-11-21 ENCOUNTER — Ambulatory Visit: Payer: PRIVATE HEALTH INSURANCE | Admitting: Cardiology

## 2023-11-27 ENCOUNTER — Ambulatory Visit: Attending: Cardiology

## 2023-11-27 DIAGNOSIS — I5022 Chronic systolic (congestive) heart failure: Secondary | ICD-10-CM | POA: Diagnosis not present

## 2023-11-27 LAB — ECHOCARDIOGRAM COMPLETE
AR max vel: 2.49 cm2
AV Peak grad: 7.4 mmHg
Ao pk vel: 1.36 m/s
Area-P 1/2: 3.61 cm2
Calc EF: 35.3 %
S' Lateral: 4.2 cm
Single Plane A2C EF: 35.1 %
Single Plane A4C EF: 35.7 %

## 2023-11-27 MED ORDER — PERFLUTREN LIPID MICROSPHERE
1.0000 mL | INTRAVENOUS | Status: AC | PRN
  Administered 2023-11-27: 3 mL via INTRAVENOUS

## 2023-12-07 ENCOUNTER — Other Ambulatory Visit: Payer: Self-pay | Admitting: Cardiology

## 2023-12-09 ENCOUNTER — Other Ambulatory Visit (HOSPITAL_BASED_OUTPATIENT_CLINIC_OR_DEPARTMENT_OTHER): Payer: Self-pay

## 2023-12-09 ENCOUNTER — Other Ambulatory Visit: Payer: Self-pay | Admitting: Family Medicine

## 2023-12-09 DIAGNOSIS — E119 Type 2 diabetes mellitus without complications: Secondary | ICD-10-CM

## 2023-12-10 ENCOUNTER — Other Ambulatory Visit (HOSPITAL_BASED_OUTPATIENT_CLINIC_OR_DEPARTMENT_OTHER): Payer: Self-pay

## 2023-12-11 ENCOUNTER — Other Ambulatory Visit: Payer: Self-pay | Admitting: Cardiology

## 2023-12-11 ENCOUNTER — Other Ambulatory Visit (HOSPITAL_BASED_OUTPATIENT_CLINIC_OR_DEPARTMENT_OTHER): Payer: Self-pay

## 2023-12-11 DIAGNOSIS — E119 Type 2 diabetes mellitus without complications: Secondary | ICD-10-CM

## 2023-12-11 MED ORDER — MOUNJARO 12.5 MG/0.5ML ~~LOC~~ SOAJ
12.5000 mg | SUBCUTANEOUS | 0 refills | Status: DC
  Filled 2023-12-11: qty 2, 28d supply, fill #0

## 2023-12-18 ENCOUNTER — Telehealth: Payer: Self-pay | Admitting: Cardiology

## 2023-12-18 MED ORDER — DAPAGLIFLOZIN PROPANEDIOL 10 MG PO TABS: 10.0000 mg | ORAL_TABLET | Freq: Every day | ORAL | 3 refills | Status: AC

## 2023-12-18 NOTE — Telephone Encounter (Signed)
 Refill sent

## 2023-12-18 NOTE — Telephone Encounter (Signed)
°*  STAT* If patient is at the pharmacy, call can be transferred to refill team.   1. Which medications need to be refilled? (please list name of each medication and dose if known)   FARXIGA  10 MG TABS tablet    2. Which pharmacy/location (including street and city if local pharmacy) is medication to be sent to? [id number] - [place], [place] - [address]   3. Do they need a 30 day or 90 day supply? 90   Patient is out of medication

## 2023-12-27 ENCOUNTER — Other Ambulatory Visit: Payer: Self-pay | Admitting: Cardiology

## 2023-12-27 ENCOUNTER — Other Ambulatory Visit (HOSPITAL_BASED_OUTPATIENT_CLINIC_OR_DEPARTMENT_OTHER): Payer: Self-pay

## 2023-12-27 DIAGNOSIS — E119 Type 2 diabetes mellitus without complications: Secondary | ICD-10-CM

## 2023-12-27 MED ORDER — MOUNJARO 15 MG/0.5ML ~~LOC~~ SOAJ
15.0000 mg | SUBCUTANEOUS | 5 refills | Status: AC
  Filled 2023-12-27: qty 2, 28d supply, fill #0
  Filled 2024-01-30: qty 2, 28d supply, fill #1

## 2024-01-04 ENCOUNTER — Ambulatory Visit: Payer: Self-pay | Admitting: Cardiology

## 2024-01-09 ENCOUNTER — Telehealth: Payer: Self-pay | Admitting: *Deleted

## 2024-01-09 DIAGNOSIS — I5022 Chronic systolic (congestive) heart failure: Secondary | ICD-10-CM

## 2024-01-09 NOTE — Telephone Encounter (Signed)
 Notified, copy to pcp.  He agrees to see EP & prefers [place] office.  Referral placed & sent to [facility] for scheduling.       Echo shows LVEF [id number]%, Id like him to see EP to at least discuss possiblitity of a defibrillator. At the visit they would be able to tell him the risks vs benefits and help form the best decision for him   JINNY Ross MD

## 2024-01-30 ENCOUNTER — Other Ambulatory Visit: Payer: Self-pay

## 2024-01-30 ENCOUNTER — Other Ambulatory Visit (HOSPITAL_BASED_OUTPATIENT_CLINIC_OR_DEPARTMENT_OTHER): Payer: Self-pay

## 2024-03-02 ENCOUNTER — Ambulatory Visit: Admitting: Cardiology

## 2024-03-18 ENCOUNTER — Ambulatory Visit: Admitting: Cardiovascular Disease

## 2024-04-14 ENCOUNTER — Ambulatory Visit: Admitting: Cardiology

## 2024-09-28 ENCOUNTER — Encounter: Admitting: Family Medicine

## 2024-11-11 ENCOUNTER — Ambulatory Visit
